# Patient Record
Sex: Male | Born: 1970 | Race: White | Hispanic: No | Marital: Married | Smoking: Former smoker
Health system: Southern US, Community
[De-identification: ages and names within clinical notes are randomized; demographics above are authoritative.]

## PROBLEM LIST (undated history)

## (undated) DIAGNOSIS — F419 Anxiety disorder, unspecified: Secondary | ICD-10-CM

## (undated) DIAGNOSIS — I10 Essential (primary) hypertension: Secondary | ICD-10-CM

## (undated) DIAGNOSIS — J40 Bronchitis, not specified as acute or chronic: Secondary | ICD-10-CM

## (undated) DIAGNOSIS — C801 Malignant (primary) neoplasm, unspecified: Secondary | ICD-10-CM

## (undated) DIAGNOSIS — K649 Unspecified hemorrhoids: Secondary | ICD-10-CM

## (undated) DIAGNOSIS — R361 Hematospermia: Secondary | ICD-10-CM

## (undated) DIAGNOSIS — T7840XA Allergy, unspecified, initial encounter: Secondary | ICD-10-CM

## (undated) DIAGNOSIS — G43909 Migraine, unspecified, not intractable, without status migrainosus: Secondary | ICD-10-CM

## (undated) DIAGNOSIS — J302 Other seasonal allergic rhinitis: Secondary | ICD-10-CM

## (undated) HISTORY — DX: Malignant (primary) neoplasm, unspecified: C80.1

## (undated) HISTORY — DX: Anxiety disorder, unspecified: F41.9

## (undated) HISTORY — DX: Other seasonal allergic rhinitis: J30.2

## (undated) HISTORY — DX: Unspecified hemorrhoids: K64.9

## (undated) HISTORY — PX: OTHER SURGICAL HISTORY: SHX169

## (undated) HISTORY — DX: Allergy, unspecified, initial encounter: T78.40XA

## (undated) HISTORY — DX: Bronchitis, not specified as acute or chronic: J40

## (undated) HISTORY — DX: Migraine, unspecified, not intractable, without status migrainosus: G43.909

## (undated) HISTORY — DX: Hematospermia: R36.1

---

## 2011-02-09 DIAGNOSIS — C801 Malignant (primary) neoplasm, unspecified: Secondary | ICD-10-CM

## 2011-02-09 HISTORY — DX: Malignant (primary) neoplasm, unspecified: C80.1

## 2014-07-26 LAB — LIPID PANEL
CHOLESTEROL: 186 (ref 0–200)
HDL: 48 (ref 35–70)
LDL CALC: 111
TRIGLYCERIDES: 135 (ref 40–160)

## 2014-07-26 LAB — BASIC METABOLIC PANEL
BUN: 10 (ref 4–21)
Creatinine: 0.8 (ref <=1.3)
Glucose: 97
Potassium: 3.9 (ref 3.4–5.3)
SODIUM: 142 (ref 137–147)

## 2014-07-26 LAB — HEPATIC FUNCTION PANEL
ALK PHOS: 78 (ref 25–125)
ALT: 35 (ref 10–40)
AST: 19 (ref 14–40)
Bilirubin, Total: 0.6

## 2014-10-30 LAB — HM HIV SCREENING LAB: HM HIV Screening: NEGATIVE

## 2014-10-30 LAB — PSA: PSA: 0.64

## 2015-02-18 LAB — CBC AND DIFFERENTIAL
HCT: 42 (ref 41–53)
Hemoglobin: 14.6 (ref 13.5–17.5)
Platelets: 300 (ref 150–399)
WBC: 10.3

## 2015-02-20 ENCOUNTER — Encounter: Payer: Self-pay | Admitting: *Deleted

## 2015-02-21 ENCOUNTER — Ambulatory Visit (INDEPENDENT_AMBULATORY_CARE_PROVIDER_SITE_OTHER): Payer: 59 | Admitting: Urology

## 2015-02-21 ENCOUNTER — Encounter: Payer: Self-pay | Admitting: Urology

## 2015-02-21 VITALS — BP 156/100 | HR 70 | Ht 71.0 in | Wt 178.9 lb

## 2015-02-21 DIAGNOSIS — R361 Hematospermia: Secondary | ICD-10-CM | POA: Diagnosis not present

## 2015-02-21 DIAGNOSIS — R1030 Lower abdominal pain, unspecified: Secondary | ICD-10-CM | POA: Diagnosis not present

## 2015-02-21 DIAGNOSIS — R1031 Right lower quadrant pain: Secondary | ICD-10-CM

## 2015-02-21 DIAGNOSIS — N451 Epididymitis: Secondary | ICD-10-CM | POA: Diagnosis not present

## 2015-02-21 LAB — URINALYSIS, COMPLETE
Bilirubin, UA: NEGATIVE
Glucose, UA: NEGATIVE
Ketones, UA: NEGATIVE
Leukocytes, UA: NEGATIVE
NITRITE UA: NEGATIVE
PH UA: 7 (ref 5.0–7.5)
Protein, UA: NEGATIVE
Specific Gravity, UA: 1.01 (ref 1.005–1.030)
Urobilinogen, Ur: 0.2 mg/dL (ref 0.2–1.0)

## 2015-02-21 LAB — MICROSCOPIC EXAMINATION
EPITHELIAL CELLS (NON RENAL): NONE SEEN /HPF (ref 0–10)
RBC MICROSCOPIC, UA: NONE SEEN /HPF (ref 0–?)

## 2015-02-21 MED ORDER — DOXYCYCLINE HYCLATE 100 MG PO CAPS: 100.0000 mg | ORAL_CAPSULE | Freq: Two times a day (BID) | ORAL | Status: DC

## 2015-02-21 NOTE — Progress Notes (Signed)
02/21/2015 3:02 PM   Paul Schroeder 09-14-1970 [id number]  Referring provider: No referring provider defined for this encounter.  Chief Complaint  Patient presents with  . Hematospermia    referred by Dr. Vertis Kelch walk in clinic  . Abdominal Pain    HPI: Patient is a 45 year old Caucasian male who is referred to [place] by Dr. Harrington Challenger for hematospermia and pain in the abdominal area.    Patient states that the hematospermia started 3 months ago.  He was also experiencing dysuria at that time. He stated he had 2 rounds of doxycycline, each time for 10 days, the hematospermia would resolve but then would return.  He states he has been told that he had blood in the urine, but the laboratory data provided by his primary care's office noted blood on urine dipstick, no micro analysis of the urine was performed.  He does not complain of any gross hematuria.  His UA today is unremarkable.  Then one week ago, he developed right-sided groin pain that radiates into his scrotum and down his inner thigh. The pain lasts for 5-10 seconds and then abates. The pain is very severe. The pain is intermittent throughout the day. He also has been experiencing frequent urination with the onset of this pain.  He does not have a prior history of kidney stone disease. He states his brother suffers with stones.  He has been having associated fevers, chills, nausea, vomiting and a 40 pound weight loss over the last 3 months. He also complains of nighttime diaphoresis.   PMH: Past Medical History  Diagnosis Date  . Seasonal allergies   . Hematospermia     Surgical History: Past Surgical History  Procedure Laterality Date  . Skin lesions removed      Home Medications:    Medication List       This list is accurate as of: 02/21/15 11:59 PM.  Always use your most recent med list.               cetirizine 1 MG/ML syrup  Commonly known as:  ZYRTEC  Take 10 mg by mouth daily.     doxycycline 100  MG capsule  Commonly known as:  VIBRAMYCIN  Take 1 capsule (100 mg total) by mouth every 12 (twelve) hours.     HYDROcodone-acetaminophen 5-325 MG tablet  Commonly known as:  NORCO/VICODIN     mometasone 50 MCG/ACT nasal spray  Commonly known as:  NASONEX  Place 2 sprays into the nose daily.     montelukast 10 MG tablet  Commonly known as:  SINGULAIR  Take 10 mg by mouth at bedtime.     SUMAtriptan 100 MG tablet  Commonly known as:  IMITREX        Allergies: No Known Allergies  Family History: Family History  Problem Relation Age of Onset  . Kidney disease Brother   . Prostate cancer Neg Hx     Social History:  reports that he has been smoking.  He does not have any smokeless tobacco history on file. He reports that he drinks alcohol. He reports that he uses illicit drugs about twice per week.  ROS: UROLOGY Frequent Urination?: Yes Hard to postpone urination?: No Burning/pain with urination?: Yes Get up at night to urinate?: No Leakage of urine?: No Urine stream starts and stops?: No Trouble starting stream?: No Do you have to strain to urinate?: No Blood in urine?: Yes Urinary tract infection?: No Sexually transmitted disease?: No  Injury to kidneys or bladder?: No Painful intercourse?: No Weak stream?: No Erection problems?: No Penile pain?: Yes  Gastrointestinal Nausea?: Yes Vomiting?: Yes Indigestion/heartburn?: No Diarrhea?: Yes Constipation?: No  Constitutional Fever: Yes Night sweats?: Yes Weight loss?: Yes Fatigue?: Yes  Skin Skin rash/lesions?: No Itching?: No  Eyes Blurred vision?: Yes Double vision?: No  Ears/Nose/Throat Sore throat?: Yes Sinus problems?: Yes  Hematologic/Lymphatic Swollen glands?: No Easy bruising?: No  Cardiovascular Leg swelling?: No Chest pain?: No  Respiratory Cough?: No Shortness of breath?: No  Endocrine Excessive thirst?: No  Musculoskeletal Back pain?: No Joint pain?:  Yes  Neurological Headaches?: Yes Dizziness?: Yes  Psychologic Depression?: No Anxiety?: No  Physical Exam: BP 156/100 mmHg  Pulse 70  Ht 5\' 11"  (1.803 m)  Wt 178 lb 14.4 oz (81.149 kg)  BMI 24.96 kg/m2  Constitutional: Well nourished. Alert and oriented, No acute distress. HEENT:  AT, moist mucus membranes. Trachea midline, no masses. Cardiovascular: No clubbing, cyanosis, or edema. Respiratory: Normal respiratory effort, no increased work of breathing. GI: Abdomen is soft, non tender, non distended, no abdominal masses. Liver and spleen not palpable.  No hernias appreciated.  Stool sample for occult testing is not indicated.   GU: No CVA tenderness.  No bladder fullness or masses.  Patient with circumcised phallus.   Urethral meatus is patent.  No penile discharge. No penile lesions or rashes. Scrotum without lesions, cysts, rashes and/or edema.  Testicles are located scrotally bilaterally. No masses are appreciated in the testicles. Left epididymis are normal.  Right epididymis is tender.  Rectal: Patient with  normal sphincter tone. Anus and perineum without scarring or rashes. No rectal masses are appreciated. Prostate is approximately 45 grams, no nodules are appreciated. Seminal vesicles are normal. Skin: No rashes, bruises or suspicious lesions. Lymph: No cervical or inguinal adenopathy. Neurologic: Grossly intact, no focal deficits, moving all 4 extremities. Psychiatric: Normal mood and affect.  Laboratory Data:  Urinalysis Results for orders placed or performed in visit on 02/21/15  Microscopic Examination  Result Value Ref Range   WBC, UA 0-5 0 -  5 /hpf   RBC, UA None seen 0 -  2 /hpf   Epithelial Cells (non renal) None seen 0 - 10 /hpf   Mucus, UA Present (A) Not Estab.   Bacteria, UA Few (A) None seen/Few  Urinalysis, Complete  Result Value Ref Range   Specific Gravity, UA 1.010 1.005 - 1.030   pH, UA 7.0 5.0 - 7.5   Color, UA Yellow Yellow   Appearance  Ur Clear Clear   Leukocytes, UA Negative Negative   Protein, UA Negative Negative/Trace   Glucose, UA Negative Negative   Ketones, UA Negative Negative   RBC, UA Trace (A) Negative   Bilirubin, UA Negative Negative   Urobilinogen, Ur 0.2 0.2 - 1.0 mg/dL   Nitrite, UA Negative Negative   Microscopic Examination See below:      Assessment & Plan:    1. Hematospermia:   I reassured the patient that hematospermia is usually a benign finding.  He seemed satisfied with my explanation.  2. Right epididymitis:   Right epididymis was tender on exam. I prescribed doxycycline 100 mg twice a day for 30 days. We will reexamine the patient when he returns to the office for his CT scan report.  3. RIght groin pain:   Patient's symptomatology may represent an undiagnosed right ureteral stone. He will be scheduled for a CT renal stone protocol and will return to discuss the  findings.  - Urinalysis, Complete   Return for CT Renal Stone Study report.  These notes generated with voice recognition software. I apologize for typographical errors.  Zara Council, [facility] [address], [place], [place] [address] [phone]

## 2015-02-22 ENCOUNTER — Telehealth: Payer: Self-pay | Admitting: Urology

## 2015-02-22 DIAGNOSIS — N451 Epididymitis: Secondary | ICD-10-CM | POA: Insufficient documentation

## 2015-02-22 DIAGNOSIS — R361 Hematospermia: Secondary | ICD-10-CM | POA: Insufficient documentation

## 2015-02-22 NOTE — Telephone Encounter (Signed)
Would you send a copy of the office visit to Dr. Harrington Challenger at [facility] physicians urgent care?

## 2015-02-25 ENCOUNTER — Telehealth: Payer: Self-pay | Admitting: Urology

## 2015-02-25 NOTE — Telephone Encounter (Signed)
Would you send a copy of the office visit to Dr. Harrington Challenger at [facility] physicians urgent care?

## 2015-02-25 NOTE — Telephone Encounter (Signed)
Done ° ° °Paul Schroeder °

## 2015-02-27 ENCOUNTER — Ambulatory Visit
Admission: RE | Admit: 2015-02-27 | Discharge: 2015-02-27 | Disposition: A | Discharge status: Home / Self Care | Payer: Managed Care, Other (non HMO) | Source: Ambulatory Visit | Attending: Urology | Admitting: Urology

## 2015-02-27 DIAGNOSIS — R1031 Right lower quadrant pain: Secondary | ICD-10-CM

## 2015-02-27 DIAGNOSIS — R3129 Other microscopic hematuria: Secondary | ICD-10-CM | POA: Insufficient documentation

## 2015-02-27 DIAGNOSIS — Z85820 Personal history of malignant melanoma of skin: Secondary | ICD-10-CM | POA: Insufficient documentation

## 2015-02-27 DIAGNOSIS — R300 Dysuria: Secondary | ICD-10-CM | POA: Diagnosis not present

## 2015-02-27 DIAGNOSIS — R1030 Lower abdominal pain, unspecified: Secondary | ICD-10-CM | POA: Insufficient documentation

## 2015-03-03 ENCOUNTER — Ambulatory Visit (INDEPENDENT_AMBULATORY_CARE_PROVIDER_SITE_OTHER): Payer: 59 | Admitting: Urology

## 2015-03-03 ENCOUNTER — Encounter: Payer: Self-pay | Admitting: Urology

## 2015-03-03 VITALS — BP 152/97 | HR 86 | Ht 71.0 in | Wt 180.2 lb

## 2015-03-03 DIAGNOSIS — R1030 Lower abdominal pain, unspecified: Secondary | ICD-10-CM | POA: Diagnosis not present

## 2015-03-03 DIAGNOSIS — R361 Hematospermia: Secondary | ICD-10-CM

## 2015-03-03 DIAGNOSIS — R1031 Right lower quadrant pain: Secondary | ICD-10-CM

## 2015-03-03 DIAGNOSIS — N451 Epididymitis: Secondary | ICD-10-CM | POA: Diagnosis not present

## 2015-03-03 LAB — URINALYSIS, COMPLETE
Bilirubin, UA: NEGATIVE
GLUCOSE, UA: NEGATIVE
Ketones, UA: NEGATIVE
Leukocytes, UA: NEGATIVE
NITRITE UA: NEGATIVE
PH UA: 7 (ref 5.0–7.5)
Protein, UA: NEGATIVE
RBC, UA: NEGATIVE
Specific Gravity, UA: 1.015 (ref 1.005–1.030)
UUROB: 0.2 mg/dL (ref 0.2–1.0)

## 2015-03-03 LAB — MICROSCOPIC EXAMINATION
BACTERIA UA: NONE SEEN
EPITHELIAL CELLS (NON RENAL): NONE SEEN /HPF (ref 0–10)
RBC MICROSCOPIC, UA: NONE SEEN /HPF (ref 0–?)
WBC UA: NONE SEEN /HPF (ref 0–?)

## 2015-03-03 LAB — BLADDER SCAN AMB NON-IMAGING: SCAN RESULT: 23

## 2015-03-03 NOTE — Progress Notes (Signed)
3:59 PM   Paul Schroeder 25-Mar-1970 [id number]  Referring provider: No referring provider defined for this encounter.  Chief Complaint  Patient presents with  . Results    CT    HPI: Patient is a 45 year old Caucasian male who is referred to [place] by Dr. Harrington Challenger for hematospermia and pain in the abdominal area who presents today for his CT Renal Stone report.  Previous history Patient states that the hematospermia started 3 months ago.  He was also experiencing dysuria at that time. He stated he had 2 rounds of doxycycline, each time for 10 days, the hematospermia would resolve but then would return.  He states he has been told that he had blood in the urine, but the laboratory data provided by his primary care's office noted blood on urine dipstick, no micro analysis of the urine was performed.  He does not complain of any gross hematuria.  Then he developed right-sided groin pain that radiates into his scrotum and down his inner thigh. The pain lasts for 5-10 seconds and then abates. The pain is very severe. The pain is intermittent throughout the day. He also has been experiencing frequent urination with the onset of this pain.  He does not have a prior history of kidney stone disease. He states his brother suffers with stones.  He has been having associated fevers, chills, nausea, vomiting and a 40 pound weight loss over the last 3 months. He also complains of nighttime diaphoresis.  Today, he reports that he is still having pain in his right groin.  He has not had relief from the antibiotics.  He is not having fevers, chills, nausea or vomiting.  He is experiencing night sweats and fatigue.  He is also complaining of dysuria, but no gross hematuria.  His UA is unremarkable on today's exam.    The CT Renal Stone Study completed on 02/27/2015 noted no etiology for his pain.  No evidence of urolithiasis, hydronephrosis, or other acute findings. I have reviewed the films with the patient.      PMH: Past Medical History  Diagnosis Date  . Seasonal allergies   . Hematospermia     Surgical History: Past Surgical History  Procedure Laterality Date  . Skin lesions removed      Home Medications:    Medication List       This list is accurate as of: 03/03/15 11:59 PM.  Always use your most recent med list.               cetirizine 1 MG/ML syrup  Commonly known as:  ZYRTEC  Take 10 mg by mouth daily. Reported on 03/03/2015     doxycycline 100 MG capsule  Commonly known as:  VIBRAMYCIN  Take 1 capsule (100 mg total) by mouth every 12 (twelve) hours.     fexofenadine 180 MG tablet  Commonly known as:  ALLEGRA  Take 180 mg by mouth daily.     HYDROcodone-acetaminophen 5-325 MG tablet  Commonly known as:  NORCO/VICODIN     mometasone 50 MCG/ACT nasal spray  Commonly known as:  NASONEX  Place 2 sprays into the nose daily.     montelukast 10 MG tablet  Commonly known as:  SINGULAIR  Take 10 mg by mouth at bedtime.     SUMAtriptan 100 MG tablet  Commonly known as:  IMITREX        Allergies: No Known Allergies  Family History: Family History  Problem Relation Age of Onset  .  Kidney disease Brother   . Prostate cancer Neg Hx     Social History:  reports that he has been smoking.  He does not have any smokeless tobacco history on file. He reports that he drinks alcohol. He reports that he uses illicit drugs about twice per week.  ROS: UROLOGY Frequent Urination?: No Hard to postpone urination?: No Burning/pain with urination?: Yes Get up at night to urinate?: No Leakage of urine?: No Urine stream starts and stops?: No Trouble starting stream?: No Do you have to strain to urinate?: No Blood in urine?: No Urinary tract infection?: No Sexually transmitted disease?: No Injury to kidneys or bladder?: No Painful intercourse?: No Weak stream?: No Erection problems?: No Penile pain?: No  Gastrointestinal Nausea?: No Vomiting?:  No Indigestion/heartburn?: No Diarrhea?: No Constipation?: No  Constitutional Fever: No Night sweats?: Yes Weight loss?: No Fatigue?: Yes  Skin Skin rash/lesions?: No Itching?: No  Eyes Blurred vision?: No Double vision?: No  Ears/Nose/Throat Sore throat?: No Sinus problems?: No  Hematologic/Lymphatic Swollen glands?: No Easy bruising?: No  Cardiovascular Leg swelling?: No Chest pain?: No  Respiratory Cough?: Yes Shortness of breath?: No  Endocrine Excessive thirst?: No  Musculoskeletal Back pain?: No Joint pain?: No  Neurological Headaches?: Yes Dizziness?: No  Psychologic Depression?: No Anxiety?: No  Physical Exam: BP 152/97 mmHg  Pulse 86  Ht 5\' 11"  (1.803 m)  Wt 180 lb 3.2 oz (81.738 kg)  BMI 25.14 kg/m2  Constitutional: Well nourished. Alert and oriented, No acute distress. HEENT: [place] AT, moist mucus membranes. Trachea midline, no masses. Cardiovascular: No clubbing, cyanosis, or edema. Respiratory: Normal respiratory effort, no increased work of breathing. GI: Abdomen is soft, non tender, non distended, no abdominal masses. Liver and spleen not palpable.  No hernias appreciated.  Stool sample for occult testing is not indicated.   GU: No CVA tenderness.  No bladder fullness or masses.  Patient with circumcised phallus.   Urethral meatus is patent.  No penile discharge. No penile lesions or rashes. Scrotum without lesions, cysts, rashes and/or edema.  Testicles are located scrotally bilaterally. No masses are appreciated in the testicles. Left epididymis are normal.  Right epididymis is tender.  Rectal: Patient with  normal sphincter tone. Anus and perineum without scarring or rashes. No rectal masses are appreciated. Prostate is approximately 45 grams, no nodules are appreciated. Seminal vesicles are normal. Skin: No rashes, bruises or suspicious lesions. Lymph: No cervical or inguinal adenopathy. Neurologic: Grossly intact, no focal deficits,  moving all 4 extremities. Psychiatric: Normal mood and affect.  Laboratory Data:  Urinalysis Results for orders placed or performed in visit on 03/03/15  Microscopic Examination  Result Value Ref Range   WBC, UA None seen 0 -  5 /hpf   RBC, UA None seen 0 -  2 /hpf   Epithelial Cells (non renal) None seen 0 - 10 /hpf   Bacteria, UA None seen None seen/Few  Urinalysis, Complete  Result Value Ref Range   Specific Gravity, UA 1.015 1.005 - 1.030   pH, UA 7.0 5.0 - 7.5   Color, UA Yellow Yellow   Appearance Ur Clear Clear   Leukocytes, UA Negative Negative   Protein, UA Negative Negative/Trace   Glucose, UA Negative Negative   Ketones, UA Negative Negative   RBC, UA Negative Negative   Bilirubin, UA Negative Negative   Urobilinogen, Ur 0.2 0.2 - 1.0 mg/dL   Nitrite, UA Negative Negative   Microscopic Examination See below:   BLADDER SCAN AMB  NON-IMAGING  Result Value Ref Range   Scan Result 23     Pertinent imaging CLINICAL DATA: Right lower quadrant pain radiating to right groin for 2 weeks. Hematocrit hernia. Microscopic hematuria and dysuria. Personal history of melanoma.  EXAM: CT ABDOMEN AND PELVIS WITHOUT CONTRAST  TECHNIQUE: Multidetector CT imaging of the abdomen and pelvis was performed following the standard protocol without IV contrast.  COMPARISON: None.  FINDINGS: Lower chest: No acute findings.  Hepatobiliary: No mass visualized on this un-enhanced exam. Gallbladder is unremarkable.  Pancreas: No mass or inflammatory process identified on this un-enhanced exam.  Spleen: Within normal limits in size.  Adrenals/Urinary Tract: No evidence of urolithiasis or hydronephrosis. No definite mass visualized on this un-enhanced exam.  Stomach/Bowel: No evidence of obstruction, inflammatory process, or abnormal fluid collections.  Vascular/Lymphatic: No pathologically enlarged lymph nodes. No evidence of abdominal aortic  aneurysm.  Reproductive: No mass or other significant abnormality.  Other: No evidence of inguinal hernia or mass.  Musculoskeletal: No suspicious bone lesions identified.  IMPRESSION: No evidence of urolithiasis, hydronephrosis, or other acute findings.   Electronically Signed  By: Earle Gell M.D.  On: 02/27/2015 08:54  Assessment & Plan:    1. Hematospermia:   I reassured the patient that hematospermia is usually a benign finding.  He seemed satisfied with my explanation.  2. Right epididymitis:   Right epididymis is still tender on exam.  I will obtain a scrotal ultrasound to rule out any testicular pathology.    3. RIght groin pain:   Patient's CT scan did not demonstrate a stone.  I will be obtaining a scrotal ultrasound to rule out any testicular pathology.    - Urinalysis, Complete   Return for scrotal ultrasound report.  These notes generated with voice recognition software. I apologize for typographical errors.  Zara Council, [facility] [address], [place], [place] [address] [phone]

## 2015-03-04 ENCOUNTER — Ambulatory Visit
Admission: RE | Admit: 2015-03-04 | Discharge: 2015-03-04 | Disposition: A | Discharge status: Home / Self Care | Payer: Managed Care, Other (non HMO) | Source: Ambulatory Visit | Attending: Urology | Admitting: Urology

## 2015-03-04 DIAGNOSIS — N5082 Scrotal pain: Secondary | ICD-10-CM | POA: Diagnosis not present

## 2015-03-04 DIAGNOSIS — R1030 Lower abdominal pain, unspecified: Secondary | ICD-10-CM | POA: Diagnosis not present

## 2015-03-04 DIAGNOSIS — R1031 Right lower quadrant pain: Secondary | ICD-10-CM

## 2015-03-05 DIAGNOSIS — R1031 Right lower quadrant pain: Secondary | ICD-10-CM | POA: Insufficient documentation

## 2015-03-06 ENCOUNTER — Encounter: Payer: Self-pay | Admitting: Urology

## 2015-03-06 ENCOUNTER — Ambulatory Visit (INDEPENDENT_AMBULATORY_CARE_PROVIDER_SITE_OTHER): Payer: 59 | Admitting: Urology

## 2015-03-06 ENCOUNTER — Ambulatory Visit: Payer: 59 | Admitting: Urology

## 2015-03-06 ENCOUNTER — Encounter: Payer: Self-pay | Admitting: *Deleted

## 2015-03-06 VITALS — BP 173/98 | HR 83 | Ht 71.0 in | Wt 180.0 lb

## 2015-03-06 DIAGNOSIS — R1031 Right lower quadrant pain: Secondary | ICD-10-CM

## 2015-03-06 DIAGNOSIS — R1030 Lower abdominal pain, unspecified: Secondary | ICD-10-CM | POA: Diagnosis not present

## 2015-03-06 NOTE — Progress Notes (Signed)
Verbal order from [facility] to refer patient to Dr. Jamal Collin at [facility] surgical  (Downstairs) for possible hernia. Called Dr. Jamal Collin office and scheduled patient his appointment is February 1st at 2:30. Patient given appointment info and instructions for appointment. (med list ,co pay, etc).

## 2015-03-06 NOTE — Progress Notes (Signed)
11:18 PM   Paul Schroeder [birth year] [id number]  Referring provider: No referring provider defined for this encounter.  Chief Complaint  Patient presents with  . Results    scrotal US report    HPI: Patient is a 45 year old Caucasian male who is referred to [place] by Dr. Harrington Challenger for hematospermia and pain in the lower right abdominal area who presents today for his scrotal ultrasound results.    Previous history Patient states that the hematospermia started 3 months ago.  He was also experiencing dysuria at that time. He stated he had 2 rounds of doxycycline, each time for 10 days, the hematospermia would resolve but then would return.  He states he has been told that he had blood in the urine, but the laboratory data provided by his primary care's office noted blood on urine dipstick, no micro analysis of the urine was performed.  He does not complain of any gross hematuria.  Then he developed right-sided groin pain that radiates into his scrotum and down his inner thigh. The pain lasts for 5-10 seconds and then abates. The pain is very severe. The pain is intermittent throughout the day. He also has been experiencing frequent urination with the onset of this pain.  He does not have a prior history of kidney stone disease. He states his brother suffers with stones.  He has been having associated fevers, chills, nausea, vomiting and a 40 pound weight loss over the last 3 months. He also complains of nighttime diaphoresis.  The CT Renal Stone Study completed on 02/27/2015 noted no etiology for his pain.  No evidence of urolithiasis, hydronephrosis, or other acute findings. I have reviewed the films with the patient.     Today, he reports that he is still having pain in his right groin.  He is having relief with on scheduled ibuprofen dosing.   He is not having fevers, chills, nausea or vomiting.  He is experiencing night sweats and fatigue.  The scrotal ultrasound performed on 03/04/2015 did not  demonstrate any etiology for his right sided inguinal abdominal pain.  I reviewed the films with the patient.   PMH: Past Medical History  Diagnosis Date  . Seasonal allergies   . Hematospermia     Surgical History: Past Surgical History  Procedure Laterality Date  . Skin lesions removed      Home Medications:    Medication List       This list is accurate as of: 03/06/15 11:59 PM.  Always use your most recent med list.               cetirizine 1 MG/ML syrup  Commonly known as:  ZYRTEC  Take 10 mg by mouth daily. Reported on 03/06/2015     doxycycline 100 MG capsule  Commonly known as:  VIBRAMYCIN  Take 1 capsule (100 mg total) by mouth every 12 (twelve) hours.     fexofenadine 180 MG tablet  Commonly known as:  ALLEGRA  Take 180 mg by mouth daily.     HYDROcodone-acetaminophen 5-325 MG tablet  Commonly known as:  NORCO/VICODIN     mometasone 50 MCG/ACT nasal spray  Commonly known as:  NASONEX  Place 2 sprays into the nose daily.     montelukast 10 MG tablet  Commonly known as:  SINGULAIR  Take 10 mg by mouth at bedtime.     SUMAtriptan 100 MG tablet  Commonly known as:  IMITREX        Allergies:  No Known Allergies  Family History: Family History  Problem Relation Age of Onset  . Kidney disease Brother   . Prostate cancer Neg Hx     Social History:  reports that he has been smoking.  He does not have any smokeless tobacco history on file. He reports that he drinks alcohol. He reports that he uses illicit drugs about twice per week.  ROS: UROLOGY Frequent Urination?: No Hard to postpone urination?: No Burning/pain with urination?: Yes Get up at night to urinate?: No Leakage of urine?: No Urine stream starts and stops?: No Trouble starting stream?: No Do you have to strain to urinate?: No Blood in urine?: No Urinary tract infection?: No Sexually transmitted disease?: No Injury to kidneys or bladder?: No Painful intercourse?: No Weak  stream?: No Erection problems?: No Penile pain?: No  Gastrointestinal Nausea?: No Vomiting?: No Indigestion/heartburn?: No Diarrhea?: No Constipation?: No  Constitutional Fever: No Night sweats?: Yes Weight loss?: No Fatigue?: Yes  Skin Skin rash/lesions?: No Itching?: No  Eyes Blurred vision?: No Double vision?: No  Ears/Nose/Throat Sore throat?: No Sinus problems?: No  Hematologic/Lymphatic Swollen glands?: No Easy bruising?: No  Cardiovascular Leg swelling?: No Chest pain?: No  Respiratory Cough?: Yes Shortness of breath?: No  Endocrine Excessive thirst?: No  Musculoskeletal Back pain?: No Joint pain?: No  Neurological Headaches?: Yes Dizziness?: No  Psychologic Depression?: No Anxiety?: No  Physical Exam: BP 173/98 mmHg  Pulse 83  Ht 5\' 11"  (1.803 m)  Wt 180 lb (81.647 kg)  BMI 25.12 kg/m2  Constitutional: Well nourished. Alert and oriented, No acute distress. HEENT: [place] AT, moist mucus membranes. Trachea midline, no masses. Cardiovascular: No clubbing, cyanosis, or edema. Respiratory: Normal respiratory effort, no increased work of breathing. GI: Abdomen is soft, non tender, non distended, no abdominal masses. Liver and spleen not palpable.  No hernias appreciated.  Stool sample for occult testing is not indicated.   GU: No CVA tenderness.  No bladder fullness or masses.  Patient with circumcised phallus.   Urethral meatus is patent.  No penile discharge. No penile lesions or rashes. Scrotum without lesions, cysts, rashes and/or edema.  Testicles are located scrotally bilaterally. No masses are appreciated in the testicles. Right and left epididymis are normal.  Patient experienced extreme tenderness when palpating on his right inguinal area. No obvious hernia was palpated. Rectal: Patient with  normal sphincter tone. Anus and perineum without scarring or rashes. No rectal masses are appreciated. Prostate is approximately 45 grams, no nodules  are appreciated. Seminal vesicles are normal. Skin: No rashes, bruises or suspicious lesions. Lymph: No cervical or inguinal adenopathy. Neurologic: Grossly intact, no focal deficits, moving all 4 extremities. Psychiatric: Normal mood and affect.  Laboratory Data:  Urinalysis Results for orders placed or performed in visit on 03/03/15  Microscopic Examination  Result Value Ref Range   WBC, UA None seen 0 -  5 /hpf   RBC, UA None seen 0 -  2 /hpf   Epithelial Cells (non renal) None seen 0 - 10 /hpf   Bacteria, UA None seen None seen/Few  Urinalysis, Complete  Result Value Ref Range   Specific Gravity, UA 1.015 1.005 - 1.030   pH, UA 7.0 5.0 - 7.5   Color, UA Yellow Yellow   Appearance Ur Clear Clear   Leukocytes, UA Negative Negative   Protein, UA Negative Negative/Trace   Glucose, UA Negative Negative   Ketones, UA Negative Negative   RBC, UA Negative Negative   Bilirubin, UA Negative  Negative   Urobilinogen, Ur 0.2 0.2 - 1.0 mg/dL   Nitrite, UA Negative Negative   Microscopic Examination See below:   BLADDER SCAN AMB NON-IMAGING  Result Value Ref Range   Scan Result 23     Pertinent imaging CLINICAL DATA: Right lower quadrant pain radiating to right groin for 2 weeks. Hematocrit hernia. Microscopic hematuria and dysuria. Personal history of melanoma.  EXAM: CT ABDOMEN AND PELVIS WITHOUT CONTRAST  TECHNIQUE: Multidetector CT imaging of the abdomen and pelvis was performed following the standard protocol without IV contrast.  COMPARISON: None.  FINDINGS: Lower chest: No acute findings.  Hepatobiliary: No mass visualized on this un-enhanced exam. Gallbladder is unremarkable.  Pancreas: No mass or inflammatory process identified on this un-enhanced exam.  Spleen: Within normal limits in size.  Adrenals/Urinary Tract: No evidence of urolithiasis or hydronephrosis. No definite mass visualized on this un-enhanced exam.  Stomach/Bowel: No evidence  of obstruction, inflammatory process, or abnormal fluid collections.  Vascular/Lymphatic: No pathologically enlarged lymph nodes. No evidence of abdominal aortic aneurysm.  Reproductive: No mass or other significant abnormality.  Other: No evidence of inguinal hernia or mass.  Musculoskeletal: No suspicious bone lesions identified.  IMPRESSION: No evidence of urolithiasis, hydronephrosis, or other acute findings.   Electronically Signed  By: Earle Gell M.D.  On: 02/27/2015 08:54  CLINICAL DATA: Right scrotal pain x1 month.  EXAM: SCROTAL ULTRASOUND  DOPPLER ULTRASOUND OF THE TESTICLES  TECHNIQUE: Complete ultrasound examination of the testicles, epididymis, and other scrotal structures was performed. Color and spectral Doppler ultrasound were also utilized to evaluate blood flow to the testicles.  COMPARISON: CT 02/26/2015.  FINDINGS: Right testicle  Measurements: 4.9 x 2.2 x 3.6 cm. No mass or microlithiasis visualized.  Left testicle  Measurements: 4.9 x 2.3 x 2.8 cm. No mass or microlithiasis visualized.  Right epididymis: Normal in size and appearance.  Left epididymis: Normal in size and appearance.  Hydrocele: None visualized.  Varicocele: None visualized.  Pulsed Doppler interrogation of both testes demonstrates normal low resistance arterial and venous waveforms bilaterally.  IMPRESSION: Negative exam.   Electronically Signed  By: Marcello Moores Register  On: 03/04/2015 13:55   Assessment & Plan:    1. Hematospermia:   I reassured the patient that hematospermia is usually a benign finding.  He seemed satisfied with my explanation.  2. Right epididymitis:   Right epididymis is no longer tender on exam.      3. RIght groin pain:   Patient's CT scan did not demonstrate a stone.  Scrotal ultrasound did not demonstrate any etiology for the pain.   I'll refer to general surgery for a possible hernia.          No Follow-up on file.  These notes generated with voice recognition software. I apologize for typographical errors.  Zara Council, [facility] [address] [place], [place] [address] [phone]

## 2015-03-12 ENCOUNTER — Encounter: Payer: Self-pay | Admitting: General Surgery

## 2015-03-12 ENCOUNTER — Ambulatory Visit (INDEPENDENT_AMBULATORY_CARE_PROVIDER_SITE_OTHER): Payer: Managed Care, Other (non HMO) | Admitting: General Surgery

## 2015-03-12 VITALS — BP 130/76 | HR 80 | Resp 14 | Ht 71.0 in | Wt 180.0 lb

## 2015-03-12 DIAGNOSIS — R1031 Right lower quadrant pain: Secondary | ICD-10-CM

## 2015-03-12 NOTE — Patient Instructions (Addendum)
Obtain CT and follow up.  Patient has been scheduled for a CT abdomen/pelvis with contrast at [facility] for 03-18-15 at 8 am (arrive 7:45 am). Prep: NPO 4 hours prior and pick up prep kit. Patient verbalizes understanding.

## 2015-03-12 NOTE — Progress Notes (Signed)
Patient ID: Paul Schroeder, male   DOB: [birth year], 45 y.o.   MRN: [id number]  Chief Complaint  Patient presents with  . Abdominal Pain    HPI Paul Schroeder is a 45 y.o. male here today for an evaluation of right scrotal pain. Patient states he noticed this area about one month ago. He states there is some intermittent pain with activity. Patient had an ultrasound done on 03/04/15. Has been taking 676m ibuprofen 3x a day. HPI I have reviewed the history of present illness with the patient.  Past Medical History  Diagnosis Date  . Seasonal allergies   . Hematospermia   . Hemorrhoids     Past Surgical History  Procedure Laterality Date  . Skin lesions removed      Family History  Problem Relation Age of Onset  . Kidney disease Brother   . Prostate cancer Neg Hx     Social History Social History  Substance Use Topics  . Smoking status: Current Every Day Smoker -- 1.00 packs/day for 1 years    Types: Cigarettes  . Smokeless tobacco: None  . Alcohol Use: 0.0 oz/week    0 Standard drinks or equivalent per week     Comment: rare    No Known Allergies  Current Outpatient Prescriptions  Medication Sig Dispense Refill  . BIOTIN 5000 PO Take by mouth.    . cetirizine (ZYRTEC) 1 MG/ML syrup Take 10 mg by mouth daily. Reported on 03/06/2015    . Coenzyme Q10 (CO Q 10 PO) Take by mouth.    . doxycycline (VIBRAMYCIN) 100 MG capsule Take 1 capsule (100 mg total) by mouth every 12 (twelve) hours. 60 capsule 0  . fexofenadine (ALLEGRA) 180 MG tablet Take 180 mg by mouth daily.    .Marland KitchenFINASTERIDE PO Take by mouth.    .Marland KitchenHYDROcodone-acetaminophen (NORCO/VICODIN) 5-325 MG tablet     . LYSINE HCL PO Take by mouth.    . mometasone (NASONEX) 50 MCG/ACT nasal spray Place 2 sprays into the nose daily.    . montelukast (SINGULAIR) 10 MG tablet Take 10 mg by mouth at bedtime.    . Multiple Vitamin (ONE-A-DAY ESSENTIAL PO) Take by mouth.    . Probiotic Product (FORTIFY DAILY PROBIOTIC  PO) Take by mouth.    . SUMAtriptan (IMITREX) 100 MG tablet      No current facility-administered medications for this visit.    Review of Systems Review of Systems  Constitutional: Negative.   Respiratory: Negative.   Cardiovascular: Negative.   Gastrointestinal: Positive for nausea and vomiting. Negative for diarrhea.    Blood pressure 130/76, pulse 80, resp. rate 14, height 5' 11"  (1.803 m), weight 180 lb (81.647 kg).  Physical Exam Physical Exam  Constitutional: He is oriented to person, place, and time. He appears well-developed and well-nourished.  Eyes: Conjunctivae are normal. No scleral icterus.  Neck: Neck supple.  Cardiovascular: Normal rate, regular rhythm and normal heart sounds.   Pulmonary/Chest: Effort normal and breath sounds normal.  Abdominal: Soft. Normal appearance and bowel sounds are normal. There is tenderness in the right lower quadrant. Hernia confirmed negative in the right inguinal area and confirmed negative in the left inguinal area.    Lymphadenopathy:    He has no cervical adenopathy.       Right: No inguinal adenopathy present.       Left: No inguinal adenopathy present.  Neurological: He is alert and oriented to person, place, and time.  Skin: Skin is warm  and dry.    Data Reviewed Ultrasound and CT reviewed. CT was done without contrast- no apparent hernia noted  Assessment    Right groin/rlq pain. No apparent findings to explain this pain.    Plan    Plan to obtain contrasted CT of abdomen   Patient has been scheduled for a CT abdomen/pelvis with contrast at [facility] for 03-18-15 at 8 am (arrive 7:45 am). Prep: NPO 4 hours prior and pick up prep kit. Patient verbalizes understanding.      This information has been scribed by Gaspar Cola CMA.  PCP:  Provider   Christene Lye 03/13/2015, 3:12 PM

## 2015-03-13 ENCOUNTER — Ambulatory Visit: Payer: 59 | Admitting: Urology

## 2015-03-13 ENCOUNTER — Encounter: Payer: Self-pay | Admitting: General Surgery

## 2015-03-18 ENCOUNTER — Ambulatory Visit
Admission: RE | Admit: 2015-03-18 | Discharge: 2015-03-18 | Disposition: A | Discharge status: Home / Self Care | Payer: Managed Care, Other (non HMO) | Source: Ambulatory Visit | Attending: General Surgery | Admitting: General Surgery

## 2015-03-18 DIAGNOSIS — R1031 Right lower quadrant pain: Secondary | ICD-10-CM | POA: Insufficient documentation

## 2015-03-18 MED ORDER — IOHEXOL 350 MG/ML SOLN
100.0000 mL | Freq: Once | INTRAVENOUS | Status: AC | PRN
  Administered 2015-03-18: 100 mL via INTRAVENOUS

## 2015-03-19 NOTE — Progress Notes (Signed)
Appointment scheduled for 03-20-15 at 3:30 pm.

## 2015-03-20 ENCOUNTER — Ambulatory Visit (INDEPENDENT_AMBULATORY_CARE_PROVIDER_SITE_OTHER): Payer: Managed Care, Other (non HMO) | Admitting: General Surgery

## 2015-03-20 VITALS — BP 132/80 | HR 74 | Resp 12 | Ht 71.0 in | Wt 175.0 lb

## 2015-03-20 DIAGNOSIS — R1031 Right lower quadrant pain: Secondary | ICD-10-CM

## 2015-03-20 NOTE — Progress Notes (Signed)
This is a 45 year old male here today to discuss CT scan done on 03/18/15. He states the pain in right groin area has been better last several days I have reviewed the history of present illness with the patient.  CT showed no evidence of bowel obstruction or acute bowel inflammation, a normal appendix, and no evidence of hernia. This was reviewed fully with patient  Exam- no hernia palpated in the right or left inguinal region.  Impression- no hernia or RLQ findings tyo account for his groin pain Advised he finish doxycycline Rx and f/u with urology. Patient to return as needed

## 2015-03-20 NOTE — Patient Instructions (Signed)
Patient aware to call back with any questions or concerns. Follow-up as needed.

## 2015-03-21 ENCOUNTER — Encounter: Payer: Self-pay | Admitting: General Surgery

## 2015-03-31 ENCOUNTER — Telehealth: Payer: Self-pay | Admitting: Urology

## 2015-03-31 NOTE — Telephone Encounter (Signed)
Patient went to see a Psychologist, sport and exercise.  Surgeon does not feel that patient has a hernia.  Surgeon feels that it may be possible infection.  Patient reporting return of sporadic pain (3/10 - 10/10).  He finished his abx last Friday.  This is his 3rd round of Di oxycycline.  Patient requesting advice.

## 2015-03-31 NOTE — Telephone Encounter (Signed)
I will need to speak with one of the physicians concerning his next step.

## 2015-04-01 NOTE — Telephone Encounter (Signed)
LMOM

## 2015-04-04 NOTE — Telephone Encounter (Signed)
LMOM

## 2015-04-08 NOTE — Telephone Encounter (Signed)
No.  I have not yet.  Dr. Erlene Quan will be in the office tomorrow.

## 2015-04-08 NOTE — Telephone Encounter (Signed)
LMOM Did you get a chance to speak with a MD?

## 2015-04-11 ENCOUNTER — Encounter: Payer: Self-pay | Admitting: Urology

## 2015-04-15 ENCOUNTER — Telehealth: Payer: Self-pay

## 2015-04-15 NOTE — Telephone Encounter (Signed)
Spoke with pt in reference to scrotal pain. Made aware Larene Beach was able to speak with a provider and Dr. Alyson Ingles will be in office 04/22/15. Pt stated that he will be here. At this time pt requested to have more pain medication due to pain becoming worse. Please advise.

## 2015-04-16 MED ORDER — NAPROXEN 375 MG PO TABS: 375.0000 mg | ORAL_TABLET | Freq: Two times a day (BID) | ORAL | Status: DC

## 2015-04-16 NOTE — Telephone Encounter (Signed)
Please notify patient that I sent in a prescription for naproxen to his pharmacy to take twice daily.  He should not take Motrin/Ibuprofen while taking this medicine.  He can take additional Tylenol as needed.

## 2015-04-16 NOTE — Telephone Encounter (Signed)
Spoke with pt in reference to naproxen is sent to pharmacy. Made aware not to take motrin/ibuprofen while on medication but can have tylenol. Pt voiced understanding.

## 2015-04-22 ENCOUNTER — Ambulatory Visit (INDEPENDENT_AMBULATORY_CARE_PROVIDER_SITE_OTHER): Payer: Managed Care, Other (non HMO) | Admitting: Urology

## 2015-04-22 VITALS — BP 190/103 | HR 73 | Ht 71.0 in | Wt 179.0 lb

## 2015-04-22 DIAGNOSIS — M6289 Other specified disorders of muscle: Secondary | ICD-10-CM | POA: Insufficient documentation

## 2015-04-22 DIAGNOSIS — M6258 Muscle wasting and atrophy, not elsewhere classified, other site: Secondary | ICD-10-CM | POA: Diagnosis not present

## 2015-04-22 MED ORDER — DIAZEPAM 5 MG PO TABS: 5.0000 mg | ORAL_TABLET | Freq: Two times a day (BID) | ORAL | Status: DC | PRN

## 2015-04-22 MED ORDER — DIAZEPAM 5 MG PO TABS
5.0000 mg | ORAL_TABLET | Freq: Two times a day (BID) | ORAL | Status: DC | PRN
Start: 2015-04-22 — End: 2015-04-22

## 2015-04-22 NOTE — Addendum Note (Signed)
Addended by: Tommy Rainwater on: 04/22/2015 01:17 PM   Modules accepted: Orders

## 2015-04-22 NOTE — Progress Notes (Signed)
04/22/2015 10:06 AM   Paul Schroeder Apr 11, 1970 [id number]  Referring provider: Lona Kettle, MD [place], [place] [address]  Chief Complaint  Patient presents with  . Testicle Pain    HPI: [name] is a 45yo seen today for right inguinal/testicular pain. He has sharp, intermittent, moderate to severe right inguinal pain that radiates to the right testicle. He states if he eats salty food the pain gets worse.  He has dysuria, frequency, and urgency. No hx of hernia repair. He has tried ibuprofen for the pain which helps relieve the pain.  He is currently on miralax PRN for constipation.  He has hard daily bowel movements.   PMH: Past Medical History  Diagnosis Date  . Seasonal allergies   . Hematospermia   . Hemorrhoids     Surgical History: Past Surgical History  Procedure Laterality Date  . Skin lesions removed      Home Medications:    Medication List       This list is accurate as of: 04/22/15 10:06 AM.  Always use your most recent med list.               BIOTIN 5000 PO  Take by mouth.     cetirizine 1 MG/ML syrup  Commonly known as:  ZYRTEC  Take 10 mg by mouth daily. Reported on 03/06/2015     CO Q 10 PO  Take by mouth.     fexofenadine 180 MG tablet  Commonly known as:  ALLEGRA  Take 180 mg by mouth daily.     FINASTERIDE PO  Take by mouth.     FORTIFY DAILY PROBIOTIC PO  Take by mouth.     LYSINE HCL PO  Take by mouth.     mometasone 50 MCG/ACT nasal spray  Commonly known as:  NASONEX  Place 2 sprays into the nose daily.     montelukast 10 MG tablet  Commonly known as:  SINGULAIR  Take 10 mg by mouth at bedtime.     naproxen 375 MG tablet  Commonly known as:  NAPROSYN  Take 1 tablet (375 mg total) by mouth 2 (two) times daily with a meal.     ONE-A-DAY ESSENTIAL PO  Take by mouth.     SUMAtriptan 100 MG tablet  Commonly known as:  IMITREX        Allergies: No Known Allergies  Family  History: Family History  Problem Relation Age of Onset  . Kidney disease Brother   . Prostate cancer Neg Hx     Social History:  reports that he has been smoking Cigarettes.  He has a 1 pack-year smoking history. He does not have any smokeless tobacco history on file. He reports that he drinks alcohol. He reports that he uses illicit drugs about twice per week.  ROS: UROLOGY Frequent Urination?: Yes Hard to postpone urination?: No Burning/pain with urination?: Yes Get up at night to urinate?: No Leakage of urine?: No Urine stream starts and stops?: No Trouble starting stream?: No Do you have to strain to urinate?: No Blood in urine?: No Urinary tract infection?: No Sexually transmitted disease?: No Injury to kidneys or bladder?: No Painful intercourse?: No Weak stream?: No Erection problems?: No Penile pain?: No  Gastrointestinal Nausea?: No Vomiting?: No Indigestion/heartburn?: No Diarrhea?: Yes Constipation?: Yes  Constitutional Fever: No Night sweats?: Yes Weight loss?: No Fatigue?: Yes  Skin Skin rash/lesions?: No Itching?: No  Eyes Blurred vision?: No Double vision?: No  Ears/Nose/Throat Sore throat?: No Sinus problems?: No  Hematologic/Lymphatic Swollen glands?: No Easy bruising?: No  Cardiovascular Leg swelling?: No Chest pain?: No  Respiratory Cough?: No Shortness of breath?: No  Endocrine Excessive thirst?: No  Musculoskeletal Back pain?: No Joint pain?: No  Neurological Headaches?: Yes Dizziness?: No  Psychologic Depression?: No Anxiety?: No  Physical Exam: BP 190/103 mmHg  Pulse 73  Ht 5\' 11"  (1.803 m)  Wt 81.194 kg (179 lb)  BMI 24.98 kg/m2  Constitutional:  Alert and oriented, No acute distress. HEENT: [place] AT, moist mucus membranes.  Trachea midline, no masses. Cardiovascular: No clubbing, cyanosis, or edema. Respiratory: Normal respiratory effort, no increased work of breathing. GI: Abdomen is soft, nontender,  nondistended, no abdominal masses GU: No CVA tenderness. No masses/lesion on penis/testis. Right inguinal tenderness to palpation Skin: No rashes, bruises or suspicious lesions. Lymph: No cervical or inguinal adenopathy. Neurologic: Grossly intact, no focal deficits, moving all 4 extremities. Psychiatric: Normal mood and affect.  Laboratory Data: No results found for: WBC, HGB, HCT, MCV, PLT  No results found for: CREATININE  No results found for: PSA  No results found for: TESTOSTERONE  No results found for: HGBA1C  Urinalysis    Component Value Date/Time   GLUCOSEU Negative 03/03/2015 1028   BILIRUBINUR Negative 03/03/2015 1028   NITRITE Negative 03/03/2015 1028   LEUKOCYTESUR Negative 03/03/2015 1028    Pertinent Imaging:   Assessment & Plan:   1. Pelvic floor dysfunction -valium 5mg  qhs -Pelvic floor PT  There are no diagnoses linked to this encounter.  No Follow-up on file.  Cleon Gustin, [facility] [address] [place], [place] [address] [phone]

## 2015-05-20 ENCOUNTER — Ambulatory Visit: Payer: Managed Care, Other (non HMO)

## 2015-05-26 ENCOUNTER — Encounter: Payer: Self-pay | Admitting: Physical Therapy

## 2015-05-26 ENCOUNTER — Ambulatory Visit: Payer: Managed Care, Other (non HMO) | Attending: Urology | Admitting: Physical Therapy

## 2015-05-26 DIAGNOSIS — R293 Abnormal posture: Secondary | ICD-10-CM | POA: Insufficient documentation

## 2015-05-26 DIAGNOSIS — R279 Unspecified lack of coordination: Secondary | ICD-10-CM | POA: Insufficient documentation

## 2015-05-26 DIAGNOSIS — R2689 Other abnormalities of gait and mobility: Secondary | ICD-10-CM | POA: Diagnosis present

## 2015-05-26 NOTE — Patient Instructions (Signed)
You are now ready to begin training the deep core muscles system: diaphragm, transverse abdominis, pelvic floor . These muscles must work together as a team.           The key to these exercises to train the brain to coordinate the timing of these muscles and to have them turn on for long periods of time to hold you upright against gravity (especially important if you are on your feet all day).These muscles are postural muscles and play a role stabilizing your spine and bodyweight. By doing these repetitions slowly and correctly instead of doing crunches, you will achieve a flatter belly without a lower pooch. You are also placing your spine in a more neutral position and breathing properly which in turn, decreases your risk for problems related to your pelvic floor, abdominal, and low back such as pelvic organ prolapse, hernias, diastasis recti (separation of superficial muscles), disk herniations, spinal fractures. These exercises set a solid foundation for you to later progress to resistance/ strength training with therabands and weights and return to other typical fitness exercises with a stronger deeper core.    Handout on decreasing downward forces on pelvic floor.

## 2015-05-27 ENCOUNTER — Ambulatory Visit: Payer: Managed Care, Other (non HMO) | Admitting: Physical Therapy

## 2015-05-27 DIAGNOSIS — R293 Abnormal posture: Secondary | ICD-10-CM

## 2015-05-27 DIAGNOSIS — R279 Unspecified lack of coordination: Secondary | ICD-10-CM | POA: Diagnosis not present

## 2015-05-27 DIAGNOSIS — R2689 Other abnormalities of gait and mobility: Secondary | ICD-10-CM

## 2015-05-27 NOTE — Therapy (Signed)
[place] MAIN [facility] SERVICES [address] [place], [place], [address] Phone: [phone]   Fax:  [phone]  Physical Therapy Treatment  Patient Details  Name: Paul Schroeder MRN: [id number] Date of Birth: 1970-10-14 Referring Provider: Noah Delaine  Encounter Date: 05/27/2015      PT End of Session - 05/27/15 1741    Visit Number 2   Number of Visits 12   Date for PT Re-Evaluation 08/18/15   PT Start Time 1340   PT Stop Time [id number]   PT Time Calculation (min) 65 min   Activity Tolerance Patient tolerated treatment well;No increased pain   Behavior During Therapy [facility] for tasks assessed/performed      Past Medical History  Diagnosis Date  . Seasonal allergies   . Hematospermia   . Hemorrhoids   . Anxiety   . Cancer [facility]) 2013    Malignant mole on his back removed   . Allergy   . Migraines     family Hx, triggered by stress  . Bronchitis     Past Surgical History  Procedure Laterality Date  . Skin lesions removed      There were no vitals filed for this visit.      Subjective Assessment - 05/27/15 1739    Subjective Pt reported he only had to be reminded of taking deeper breaths twice today by his apply watch app because he  has gained more awareness.               [facility] PT Assessment - 05/27/15 1614    PROM   Overall PROM Comments HIP IR 0 deg bilaterally    Palpation   Spinal mobility Referred pain from T10-12 with PAVM along SP. hypomobile thoracic segments. Increased paraspinal mm tensions along midback     Palpation comment tenderness and tightness along obturator rami (obturator internus B )                    Pelvic Floor Special Questions - 05/27/15 1317    Diastasis Recti neg           [facility] Adult PT Treatment/Exercise - 05/27/15 1737    Neuro Re-ed    Neuro Re-ed Details  see pt instructions ( sitting posture, modify car seat with towels)  edcuation of spinal curves   Exercises   Exercises --  see pt instructions   Manual Therapy   Other Manual Therapy Grade II-III PA along SP of T5, T10-12, Grade II along TP on R and CVJ B along thorax.  STM  release along B obt int                 PT Education - 05/27/15 1319    Education provided Yes   Education Details POC, anatomy, physiology, goals, nervious system and the role of stress, exercise principles for decreasing strain on pelvic floor mm    Person(s) Educated Patient   Methods Explanation;Demonstration;Tactile cues;Verbal cues;Handout   Comprehension Returned demonstration;Verbalized understanding             PT Long Term Goals - 05/27/15 1359    PT LONG TERM GOAL #1   Title Pt ill decrease his score on NIH-CPSI 56% to < 46% in order to restore pelvic floor function and improve QOL.   Time 12   Period Weeks   Status New   PT LONG TERM GOAL #2   Title Pt will demo proper sitting posture across 2 visits to  improve spinal alignment and optimal activation of deep core mm when sitting at his desk.   Time 12   Period Weeks   Status New   PT LONG TERM GOAL #3   Title Pt will demo no tenderness to palpation to R medical aspect of iliac crest and can demo no lumbopelvic instability with 5 reps of deep core level 1-4 in order to progress to fitness activities with minimize risks for injuries.    Time 12   Period Weeks   Status New   PT LONG TERM GOAL #4   Title Pt will report no R groin pain with driving nor urination for 1 week in order to return to ADLs.    Time 12   Period Weeks   Status New               Plan - 05/27/15 1742    Clinical Impression Statement Pt responded to manual Tx without complaints and showed increased spinal/ hip mobility, more upright posture, and decreased mm tensions along his midback and pelvic floor. Pt will continue to benefit from continued skilled PT.    Rehab Potential Good   PT Frequency 2x / week   PT Duration 12 weeks   PT Treatment/Interventions  ADLs/Self Care Home Management;Aquatic Therapy;Electrical Stimulation;Cryotherapy;Biofeedback;Gait training;Traction;Moist Heat;Functional mobility training;Therapeutic activities;Stair training;Therapeutic exercise;Balance training;Neuromuscular re-education;Manual techniques;Patient/family education;Scar mobilization;Energy conservation;Taping   Consulted and Agree with Plan of Care Patient      Patient will benefit from skilled therapeutic intervention in order to improve the following deficits and impairments:  Improper body mechanics, Pain, Postural dysfunction, Decreased mobility, Decreased coordination, Decreased activity tolerance, Decreased endurance, Decreased strength, Impaired flexibility, Decreased range of motion, Decreased safety awareness  Visit Diagnosis: Unspecified lack of coordination  Other abnormalities of gait and mobility  Abnormal posture     Problem List Patient Active Problem List   Diagnosis Date Noted  . Pelvic floor dysfunction 04/22/2015  . Right groin pain 03/05/2015  . Hematospermia 02/22/2015  . Epididymitis, right 02/22/2015    Jerl Mina ,PT, DPT, E-RYT  05/27/2015, 5:44 PM  [place] MAIN [facility] SERVICES [address] [place], [place], [address] Phone: [phone]   Fax:  [phone]  Name: Riyansh Westmeyer MRN: [id number] Date of Birth: [birth year]

## 2015-05-27 NOTE — Therapy (Signed)
[place] MAIN [facility] SERVICES [address] [place], [place], [address] Phone: [phone]   Fax:  [phone]  Physical Therapy Evaluation  Patient Details  Name: Paul Schroeder MRN: [id number] Date of Birth: November 22, 1970 Referring Provider: Noah Delaine  Encounter Date: 05/26/2015      PT End of Session - 05/27/15 1320    Visit Number 1   Number of Visits 12   Date for PT Re-Evaluation 08/18/15   PT Start Time 0805   PT Stop Time 0915   PT Time Calculation (min) 70 min   Activity Tolerance Patient tolerated treatment well;No increased pain   Behavior During Therapy [facility] for tasks assessed/performed      Past Medical History  Diagnosis Date  . Seasonal allergies   . Hematospermia   . Hemorrhoids   . Anxiety   . Cancer [facility]) 2013    Malignant mole on his back removed   . Allergy   . Migraines     family Hx, triggered by stress  . Bronchitis     Past Surgical History  Procedure Laterality Date  . Skin lesions removed      There were no vitals filed for this visit.       Subjective Assessment - 05/26/15 0814    Subjective Pt reported R groin pain that radiates to the pelvic area. 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 There were times when pt had to help pt of the floor which pt wonders if lifting him may have triggered the R groin pain along with increased stress about the situation. Currently, pt expereinces less stress because friend situation has improved and he no longer has to play caretaker and also his medication signs and symptoms have also improved with medications prsecribed by his urologist.  Initially the pain was sharp, 10/10, experienced 4x/ day and now only 1x /day.  He experienced nausea and vomitting 1x/ day daily, and now 2x in the past week.  Initially, pt had blood in semen and  UTI and these Sx have improved since new  medications. Semen amount has increased and bowels have also improved.  Currently, pain is 3/10 localized in R groin with an escalation of pain 10/10 once a day in the car when driving, in the movie theatre, and with intake of salty foods. Pt 's pain also is triggered when contracting pelvic floor with stopping the flow of urination. Pt has been cleared of hernias. Pt had a prior sit-up routine 20x, sit-up 20 x for 3x/ week.      Pertinent History Six months ago, experienced weight -loss of 65 lbs with human growth hormone and decreased carbs, a program initiated by his PCP. Pt has decreased his medications and  his headaches haved improved.              [facility] PT Assessment - 05/27/15 1312    Assessment   Medical Diagnosis pelvic pain   Referring Provider Noah Delaine   Precautions   Precautions None   Restrictions   Weight Bearing Restrictions No   Balance Screen   Has the patient fallen in the past 6 months No   Observation/Other Assessments   Observations ankles under chair, slumped sitting, anxious demeanor  tearful during session   Other Surveys  --  NIH-CPSI 56%    Coordination   Gross Motor Movements are Fluid and Coordinated --  chest breathing, no pelvic floor ROM   Fine Motor Movements are Fluid and Coordinated --  abdominal/pelvic floor straining   Posture/Postural Control   Posture Comments lumbopelvic instability with hip flexion   Palpation   Palpation comment flinching tenderness along medial edge of R iliac crest in supine w/ L hip flexion (pre-Tx) , less tenderness w/ L hip flexion (post-Tx)   Bed Mobility   Bed Mobility --  crunch method (p!), no pain with log roolling cue                 Pelvic Floor Special Questions - 05/27/15 1317    Diastasis Recti neg          [facility] Adult PT Treatment/Exercise - 05/27/15 1312    Therapeutic Activites    Therapeutic Activities --  proper bodymechanics with toileting, bed mobility    Neuro Re-ed    Neuro  Re-ed Details  see pt instructions  education o POC, HEP, goals, anatomy/physiology                 PT Education - 05/27/15 1319    Education provided Yes   Education Details POC, anatomy, physiology, goals, nervious system and the role of stress, exercise principles for decreasing strain on pelvic floor mm    Person(s) Educated Patient   Methods Explanation;Demonstration;Tactile cues;Verbal cues;Handout   Comprehension Returned demonstration;Verbalized understanding            PT Long Term Goals - 05/27/15 1359    PT LONG TERM GOAL #1   Title Pt ill decrease his score on NIH-CPSI 56% to < 46% in order to restore pelvic floor function and improve QOL.   Time 12   Period Weeks   Status New   PT LONG TERM GOAL #2   Title Pt will demo proper sitting posture across 2 visits to improve spinal alignment and optimal activation of deep core mm when sitting at his desk.   Time 12   Period Weeks   Status New   PT LONG TERM GOAL #3   Title Pt will demo no tenderness to palpation to R medical aspect of iliac crest and can demo no lumbopelvic instability with 5 reps of deep core level 1-4 in order to progress to fitness activities with minimize risks for injuries.    Time 12   Period Weeks   Status New   PT LONG TERM GOAL #4   Title Pt will report no R groin pain with driving nor urination for 1 week in order to return to ADLs.    Time 12   Period Weeks   Status New              Plan - 05/27/15 1321    Clinical Impression Statement Pt is a 45 yo male whose complaints include R groin pain that radiates to his pelvic area and occurs with driving and urination. Through PT exam, pt's pain was reproduced with palpation to medial  aspect of R iliac crest and with  L hip flexion in a supine position. Pain was decreased after treatment. Pt's deficits that contribute to his pain include poor posture, weak deep core mm, dyscoordination of diaphragm , pelvic floor, and abdominal wall.  Pt had poor knowledge of fitness exercises and body mechanics with functional activities and demo'd movement patterns which placed strain on abdominopelvic areas. Pt also showed poor stress-management skills and had psychosocial contributors to his Sx. Pt appeared less anxious and more relaxed post-Tx.  Plan to assess spinal segmental mobility and ilioinginual and iliohypograstric nn innervations from lower thoracic segments given pt's increased thoracic kyphotic posture.      Rehab Potential Good   PT Frequency 2x / week   PT Duration 12 weeks   PT Treatment/Interventions ADLs/Self Care Home Management;Aquatic Therapy;Electrical Stimulation;Cryotherapy;Biofeedback;Gait training;Traction;Moist Heat;Functional mobility training;Therapeutic activities;Stair training;Therapeutic exercise;Balance training;Neuromuscular re-education;Manual techniques;Patient/family education;Scar mobilization;Energy conservation;Taping   Consulted and Agree with Plan of Care Patient      Patient will benefit from skilled therapeutic intervention in order to improve the following deficits and impairments:  Improper body mechanics, Pain, Postural dysfunction, Decreased mobility, Decreased coordination, Decreased activity tolerance, Decreased endurance, Decreased strength, Impaired flexibility, Decreased range of motion, Decreased safety awareness  Visit Diagnosis: Unspecified lack of coordination  Other abnormalities of gait and mobility  Abnormal posture     Problem List Patient Active Problem List   Diagnosis Date Noted  . Pelvic floor dysfunction 04/22/2015  . Right groin pain 03/05/2015  . Hematospermia 02/22/2015  . Epididymitis, right 02/22/2015    Paul Schroeder ,PT, DPT, E-RYT  05/27/2015, 1:36 PM  [place] MAIN [facility] SERVICES [address] [place], [place], [address] Phone: [phone]   Fax:  [phone]  Name: Paul Schroeder MRN:  [id number] Date of Birth: 02-16-1970

## 2015-05-27 NOTE — Patient Instructions (Addendum)
Sitting posture (feet under knees, pelvis neutral)   Take a profile pic of your work station  [employer] in car seat with vertical towel folded into thirds to support spine (decrease slumped posture)  And fill half folded towel into bucket seat to bring hips level with knees as much as possible.    ____________________   Diaphragmatic breathing on belly (pillow under hips) , forehead on hands   15 in a.m, 15 in p.m.   Frog stretch on belly:  inhale, then exhale heels apart  (pillow under hips )  (TO INCREASE HIP FLEXIBILITY)  15 in am, 15 in pm    Open book (handout) in sidelying with pillow between knees and bhind back  15, 15 L/R   DEEP CORE LEVEL 2 (knee out)  15 in am. 15 am.     ___________________________  At work: stretches:  Open book by the wall    10x, 10 x L /R    Seated figure -4  Cross over     5 breaths each side

## 2015-06-02 ENCOUNTER — Encounter: Payer: Managed Care, Other (non HMO) | Admitting: Physical Therapy

## 2015-06-04 LAB — CBC AND DIFFERENTIAL
HCT: 43 (ref 41–53)
HEMOGLOBIN: 14.7 (ref 13.5–17.5)
PLATELETS: 285 (ref 150–399)
WBC: 9.4

## 2015-06-06 ENCOUNTER — Ambulatory Visit (INDEPENDENT_AMBULATORY_CARE_PROVIDER_SITE_OTHER): Payer: Managed Care, Other (non HMO) | Admitting: Urology

## 2015-06-06 VITALS — BP 149/88 | HR 88 | Temp 98.1°F | Resp 16 | Wt 174.0 lb

## 2015-06-06 DIAGNOSIS — M6258 Muscle wasting and atrophy, not elsewhere classified, other site: Secondary | ICD-10-CM

## 2015-06-06 DIAGNOSIS — M6289 Other specified disorders of muscle: Secondary | ICD-10-CM

## 2015-06-06 MED ORDER — BACLOFEN 10 MG PO TABS: 10.0000 mg | ORAL_TABLET | Freq: Two times a day (BID) | ORAL | Status: DC

## 2015-06-06 MED ORDER — DIAZEPAM 5 MG PO TABS: 5.0000 mg | ORAL_TABLET | Freq: Three times a day (TID) | ORAL | Status: DC | PRN

## 2015-06-06 MED ORDER — DIAZEPAM 5 MG PO TABS: 5.0000 mg | ORAL_TABLET | Freq: Two times a day (BID) | ORAL | Status: DC | PRN

## 2015-06-06 MED ORDER — BACLOFEN 10 MG PO TABS: 5.0000 mg | ORAL_TABLET | Freq: Two times a day (BID) | ORAL | Status: DC

## 2015-06-06 NOTE — Progress Notes (Signed)
06/06/2015 11:31 AM   Paul Schroeder 27-May-1970 [id number]  Referring provider: Lona Kettle, MD [place], [place] [address]  Chief Complaint  Patient presents with  . Follow-up    for pelvic floor dysfunction    HPI: Paul Schroeder is a 45yo here for followup for pelvic floor dysfunction. He was prescribed valium and schedule for pelvic floor PT. He has been to 2 PT appointments. The right inguinal pain worsens throughout the day.  The valium has helped his pelvic/inguinakl pain at night.  He has mild urgency, frequency and nocturia which has greatly improved since starting PT   PMH: Past Medical History  Diagnosis Date  . Seasonal allergies   . Hematospermia   . Hemorrhoids   . Anxiety   . Cancer [facility]) 2013    Malignant mole on his back removed   . Allergy   . Migraines     family Hx, triggered by stress  . Bronchitis     Surgical History: Past Surgical History  Procedure Laterality Date  . Skin lesions removed      Home Medications:    Medication List       This list is accurate as of: 06/06/15 11:31 AM.  Always use your most recent med list.               BIOTIN 5000 PO  Take by mouth.     cetirizine 1 MG/ML syrup  Commonly known as:  ZYRTEC  Take 10 mg by mouth daily. Reported on 03/06/2015     CO Q 10 PO  Take by mouth.     diazepam 5 MG tablet  Commonly known as:  VALIUM  Take 1 tablet (5 mg total) by mouth 3 times/day as needed-between meals & bedtime for anxiety or muscle spasms.     fexofenadine 180 MG tablet  Commonly known as:  ALLEGRA  Take 180 mg by mouth daily.     FINASTERIDE PO  Take by mouth.     FORTIFY DAILY PROBIOTIC PO  Take by mouth.     LYSINE HCL PO  Take by mouth.     mometasone 50 MCG/ACT nasal spray  Commonly known as:  NASONEX  Place 2 sprays into the nose daily.     montelukast 10 MG tablet  Commonly known as:  SINGULAIR  Take 10 mg by mouth at bedtime.     ONE-A-DAY ESSENTIAL PO  Take  by mouth.     SUMAtriptan 100 MG tablet  Commonly known as:  IMITREX        Allergies: No Known Allergies  Family History: Family History  Problem Relation Age of Onset  . Kidney disease Brother   . Prostate cancer Neg Hx     Social History:  reports that he has been smoking Cigarettes.  He has a 1 pack-year smoking history. He does not have any smokeless tobacco history on file. He reports that he drinks alcohol. He reports that he uses illicit drugs about twice per week.  ROS: UROLOGY Frequent Urination?: Yes Hard to postpone urination?: No Burning/pain with urination?: Yes Get up at night to urinate?: No Leakage of urine?: No Urine stream starts and stops?: No Trouble starting stream?: No Do you have to strain to urinate?: Yes Blood in urine?: No Urinary tract infection?: No Sexually transmitted disease?: No Injury to kidneys or bladder?: No Painful intercourse?: Yes Weak stream?: No Erection problems?: Yes Penile pain?: Yes  Gastrointestinal Nausea?: Yes Vomiting?: Yes  Indigestion/heartburn?: No Diarrhea?: Yes Constipation?: Yes  Constitutional Fever: No Night sweats?: Yes Weight loss?: No Fatigue?: Yes  Skin Skin rash/lesions?: No Itching?: No  Eyes Blurred vision?: Yes Double vision?: No  Ears/Nose/Throat Sore throat?: No Sinus problems?: No  Hematologic/Lymphatic Swollen glands?: No Easy bruising?: No  Cardiovascular Leg swelling?: No Chest pain?: No  Respiratory Cough?: No Shortness of breath?: No  Endocrine Excessive thirst?: Yes  Musculoskeletal Back pain?: No Joint pain?: No  Neurological Headaches?: Yes Dizziness?: Yes  Psychologic Depression?: Yes Anxiety?: Yes  Physical Exam: BP 149/88 mmHg  Pulse 88  Temp(Src) 98.1 F (36.7 C)  Resp 16  Wt 78.926 kg (174 lb)  Constitutional:  Alert and oriented, No acute distress. HEENT: [place] AT, moist mucus membranes.  Trachea midline, no masses. Cardiovascular: No  clubbing, cyanosis, or edema. Respiratory: Normal respiratory effort, no increased work of breathing. GI: Abdomen is soft, nontender, nondistended, no abdominal masses GU: No CVA tenderness. Skin: No rashes, bruises or suspicious lesions. Lymph: No cervical or inguinal adenopathy. Neurologic: Grossly intact, no focal deficits, moving all 4 extremities. Psychiatric: Normal mood and affect.  Laboratory Data: No results found for: WBC, HGB, HCT, MCV, PLT  No results found for: CREATININE  No results found for: PSA  No results found for: TESTOSTERONE  No results found for: HGBA1C  Urinalysis    Component Value Date/Time   APPEARANCEUR Clear 03/03/2015 1028   GLUCOSEU Negative 03/03/2015 1028   BILIRUBINUR Negative 03/03/2015 1028   PROTEINUR Negative 03/03/2015 1028   NITRITE Negative 03/03/2015 1028   LEUKOCYTESUR Negative 03/03/2015 1028    Pertinent Imaging: none  Assessment & Plan:    Pelvic floor dysfunction -Continue valium prn -continue pelvic floor PT -baclofen 5mg  BID for 1 week then 10mg  BID RTC 1 month  There are no diagnoses linked to this encounter.  No Follow-up on file.  Nicolette Bang, MD  [facility] [address] [place], [place] [address] [phone]

## 2015-06-09 ENCOUNTER — Ambulatory Visit: Payer: Managed Care, Other (non HMO) | Attending: Urology | Admitting: Physical Therapy

## 2015-06-09 DIAGNOSIS — R2689 Other abnormalities of gait and mobility: Secondary | ICD-10-CM | POA: Insufficient documentation

## 2015-06-09 DIAGNOSIS — R293 Abnormal posture: Secondary | ICD-10-CM

## 2015-06-09 DIAGNOSIS — R279 Unspecified lack of coordination: Secondary | ICD-10-CM

## 2015-06-09 NOTE — Patient Instructions (Addendum)
Strengthening Deep core level 2 (knee out)  10 x 3 reps  NEW: "w" with red band  10X 3 reps    Stretches: Open 15 reps each side  NEW: with a tie on [place] of foot R, L knee bent stabilizing with hips. Hold tie/strap  and roll foot across/ away  midline ~15-20 deg  10 reps each    NEW: at work: semi tandem (ski track) stretching hip flexors 5 breaths   Relaxing:  New: folded twoel into thirds lay with top edge of towel (placed horizontally) under armpits to open chest, shoulders blade glide back and a small rolled towel under neck (make sure small enough to keep chin neutral)  5 min-10 min

## 2015-06-09 NOTE — Therapy (Signed)
[place] MAIN [facility] SERVICES [address] [place], [place], [address] Phone: [phone]   Fax:  [phone]  Physical Therapy Treatment  Patient Details  Name: Paul Schroeder MRN: [id number] Date of Birth: [birth year] Referring Provider: Noah Delaine  Encounter Date: 06/09/2015      PT End of Session - 06/09/15 0959    Visit Number 3   Number of Visits 12   Date for PT Re-Evaluation 08/18/15   PT Start Time 0805   PT Stop Time 0900   PT Time Calculation (min) 55 min   Activity Tolerance Patient tolerated treatment well;No increased pain   Behavior During Therapy [facility] for tasks assessed/performed      Past Medical History  Diagnosis Date  . Seasonal allergies   . Hematospermia   . Hemorrhoids   . Anxiety   . Cancer [facility]) 2013    Malignant mole on his back removed   . Allergy   . Migraines     family Hx, triggered by stress  . Bronchitis     Past Surgical History  Procedure Laterality Date  . Skin lesions removed      There were no vitals filed for this visit.      Subjective Assessment - 06/09/15 0807    Subjective Pt reported no al ot of change. Pt has started a muscle relaxer and that has helped.  Pt reported after last session, pt felt relief from his pelvic pain for atleast two days.    Pertinent History Six months ago, experienced weight -loss of 65 lbs with human growth hormone and decreased carbs, a program initiated by his PCP. Pt has decreased his medications and  his headaches haved improved.              [facility] PT Assessment - 06/09/15 0955    Observation/Other Assessments   Observations R groin pain with L hip flexion end range    PROM   Overall PROM Comments hip IR ~10 deg B (pre Tx), hip ~15 deg (post-Tx)     Palpation   Spinal mobility no referred pain to R groin pain. hypomobile joints T5-12 , increased paraspinals at midback R > L (decreased postTx)    Palpation comment decreased  tightness.tenderness at L obturator rami                      [facility] Adult PT Treatment/Exercise - 06/09/15 0957    Exercises   Exercises --  see pt instructions   Moist Heat Therapy   Moist Heat Location --  back 5 min, skin intact post TX   Manual Therapy   Other Manual Therapy Grade III PA along SP of T5-12, TP along R, STM along R paraspinals at midback   STM along Morral joints and pects                PT Education - 06/09/15 0959    Education provided Yes   Education Details HEP   Person(s) Educated Patient   Methods Explanation;Demonstration;Tactile cues;Verbal cues;Handout   Comprehension Returned demonstration;Verbalized understanding             PT Long Term Goals - 06/09/15 1004    PT LONG TERM GOAL #1   Title Pt ill decrease his score on NIH-CPSI 56% to < 46% in order to restore pelvic floor function and improve QOL.   Time 12   Period Weeks   Status On-going   PT LONG TERM  GOAL #2   Title Pt will demo proper sitting posture across 2 visits to improve spinal alignment and optimal activation of deep core mm when sitting at his desk.   Time 12   Period Weeks   Status Partially Met   PT LONG TERM GOAL #3   Title Pt will demo no tenderness to palpation to R medical aspect of iliac crest and can demo no lumbopelvic instability with 5 reps of deep core level 1-4 in order to progress to fitness activities with minimize risks for injuries.    Time 12   Period Weeks   Status On-going   PT LONG TERM GOAL #4   Title Pt will report no R groin pain with driving nor urination for 1 week in order to return to ADLs.    Time 12   Period Weeks   Status On-going               Plan - 06/09/15 1000    Clinical Impression Statement Pt showed increased hip ROM, decreased midback/ pelvic floor mm tensions, and  improved thoracic segmental mobility. Pt continues to have R groin pain with L hip flexion in supine position and plan to assess pubic  symphysis area at next session. Initiated theraband back strengthening exercising today.   Pt required cuing to decrease effort with breathing minimize light-headedness. Pt had no complaints at the end of the session after corrections to cues. Pt will continue benefit from skilled PT.  Educated pt to initiate a regular aerobic activity like walking around the block for wellness.    Rehab Potential Good   PT Frequency 2x / week   PT Duration 12 weeks   PT Treatment/Interventions ADLs/Self Care Home Management;Aquatic Therapy;Electrical Stimulation;Cryotherapy;Biofeedback;Gait training;Traction;Moist Heat;Functional mobility training;Therapeutic activities;Stair training;Therapeutic exercise;Balance training;Neuromuscular re-education;Manual techniques;Patient/family education;Scar mobilization;Energy conservation;Taping   Consulted and Agree with Plan of Care Patient      Patient will benefit from skilled therapeutic intervention in order to improve the following deficits and impairments:  Improper body mechanics, Pain, Postural dysfunction, Decreased mobility, Decreased coordination, Decreased activity tolerance, Decreased endurance, Decreased strength, Impaired flexibility, Decreased range of motion, Decreased safety awareness  Visit Diagnosis: Unspecified lack of coordination  Other abnormalities of gait and mobility  Abnormal posture     Problem List Patient Active Problem List   Diagnosis Date Noted  . Pelvic floor dysfunction 04/22/2015  . Right groin pain 03/05/2015  . Hematospermia 02/22/2015  . Epididymitis, right 02/22/2015    Jerl Mina ,PT, DPT, E-RYT  06/09/2015, 10:05 AM  [place] MAIN [facility] SERVICES [address] [place], [place], [address] Phone: [phone]   Fax:  [phone]  Name: Paul Schroeder MRN: [id number] Date of Birth: 05/24/1970

## 2015-06-20 ENCOUNTER — Ambulatory Visit: Payer: Managed Care, Other (non HMO) | Admitting: Physical Therapy

## 2015-06-20 DIAGNOSIS — R279 Unspecified lack of coordination: Secondary | ICD-10-CM | POA: Diagnosis not present

## 2015-06-20 DIAGNOSIS — R293 Abnormal posture: Secondary | ICD-10-CM

## 2015-06-20 DIAGNOSIS — R2689 Other abnormalities of gait and mobility: Secondary | ICD-10-CM

## 2015-06-20 NOTE — Patient Instructions (Addendum)
Relaxation:  1 a)  midback on yoga blocks (vertically placed between shoulder blades, horizontal behind head), knees bent   10 min  1b)   End the above pose with laying flat and perform 3 cycles of body scan (audio emailed)  This can be your winddown routine after work  Work exercises/ stretches    Extended side angle  (longer length between feet on "ski tracks" and hip width apart.  L Front knee above ankle and not let it turn with pelvis as you move ribcage to R slightly). R palm "slicing" the air , parking upper arm by ear and with exhale, relax shoulder blade down away from ears)  5 breaths  Both sides L/ R    Heel raises (with equal weight bearing across balllmound to avoid twisting ankle)  10 x 3 x day    Strengthening: Bridge 5 reps (engaging through points of stability of arms, shoulders, head and feet)    Mini squats with knees behind toes (inhale expandpelvc floor, exhale, rise up) x 10    Continue with "W"

## 2015-06-20 NOTE — Therapy (Signed)
[place] MAIN [facility] SERVICES [address] [place], [place], [address] Phone: [phone]   Fax:  [phone]  Physical Therapy Treatment  Patient Details  Name: Paul Schroeder MRN: [id number] Date of Birth: [birth year] Referring Provider: Noah Delaine  Encounter Date: 06/20/2015      PT End of Session - 06/20/15 1005    Visit Number 4   Number of Visits 12   Date for PT Re-Evaluation 08/18/15   PT Start Time 0805   PT Stop Time 0910   PT Time Calculation (min) 65 min   Activity Tolerance Patient tolerated treatment well;No increased pain   Behavior During Therapy [facility] for tasks assessed/performed      Past Medical History  Diagnosis Date  . Seasonal allergies   . Hematospermia   . Hemorrhoids   . Anxiety   . Cancer [facility]) 2013    Malignant mole on his back removed   . Allergy   . Migraines     family Hx, triggered by stress  . Bronchitis     Past Surgical History  Procedure Laterality Date  . Skin lesions removed      There were no vitals filed for this visit.      Subjective Assessment - 06/20/15 0823    Subjective Pain is no longer continuous. When the pain comes, it is more severe and occurs mostly around lunch time and before bed time when medicine wears out. Pt continues to perform stretches at work. Pt reported he forgot to take his mm relaxer this morning. Pt 's MD plans to increase dosage next week.   Pertinent History Six months ago, experienced weight -loss of 65 lbs with human growth hormone and decreased carbs, a program initiated by his PCP. Pt has decreased his medications and  his headaches haved improved.              [facility] PT Assessment - 06/20/15 0854    Observation/Other Assessments   Observations rounded shoulders    Squat   Comments simulated workout routine. squat with anterior load on knee joints, demo'd proper alignment with demo and cues   Posture/Postural Control   Posture Comments  hyperextended knees in standing, posterior tilt of pelvis    Palpation   Palpation comment very minimal perineal mm tensions at pubic rami B                     [facility] Adult PT Treatment/Exercise - 06/20/15 0933    Therapeutic Activites    Therapeutic Activities --  see pt instructions   Neuro Re-ed    Neuro Re-ed Details  bridges with deep core activation (minimize pelvic floor/glut tightening) 5 reps, standing posture, refined alignment and progressed extended side angle                  PT Education - 06/20/15 0928    Education provided Yes   Education Details HEP, posture in standing and benefits of body scan    Person(s) Educated Patient   Methods Explanation;Demonstration;Tactile cues;Handout;Verbal cues   Comprehension Returned demonstration;Verbalized understanding             PT Long Term Goals - 06/09/15 1004    PT LONG TERM GOAL #1   Title Pt ill decrease his score on NIH-CPSI 56% to < 46% in order to restore pelvic floor function and improve QOL.   Time 12   Period Weeks   Status On-going   PT LONG  TERM GOAL #2   Title Pt will demo proper sitting posture across 2 visits to improve spinal alignment and optimal activation of deep core mm when sitting at his desk.   Time 12   Period Weeks   Status Partially Met   PT LONG TERM GOAL #3   Title Pt will demo no tenderness to palpation to R medical aspect of iliac crest and can demo no lumbopelvic instability with 5 reps of deep core level 1-4 in order to progress to fitness activities with minimize risks for injuries.    Time 12   Period Weeks   Status On-going   PT LONG TERM GOAL #4   Title Pt will report no R groin pain with driving nor urination for 1 week in order to return to ADLs.    Time 12   Period Weeks   Status On-going               Plan - 06/20/15 1005    Clinical Impression Statement Since his last session 2 weeks ago, pt reports that his pain is less continuous but  does return at greater instensity once his medications  wears off. Today, pt stated he forgot to take his mm relaxer mm and upon completing today's session, pt reported he felt " better than he has felt in a long time". Pt demo'd improved posture in standing with neuro-muscular re-education, refined technique with extended -side angle yoga posture, and decreased pelvic floor mm/ midback mm tensions today.  Pt progressed with strengthening exercises along with additional relaxation exercises. Pt was introduced the body scan technique and pain science education which her reported helped him to feel more relaxed.  Pt was educated to work with his MD to manage his pain medications.  Next visit: plan to reassess goals,  recheck R groin pain w/ hip flexion, and progress to deep core level 3, and continue to gradually help pt progress and modify his self-selected workout routine. Pt will continue to make improvements with a biopsychosocial approach to manage pain.  Pt continues to make progress with skilled PT. Frequency has been reduced to 1 x week.    Rehab Potential Good   PT Frequency 1x / week   PT Duration 12 weeks   PT Treatment/Interventions ADLs/Self Care Home Management;Aquatic Therapy;Electrical Stimulation;Cryotherapy;Biofeedback;Gait training;Traction;Moist Heat;Functional mobility training;Therapeutic activities;Stair training;Therapeutic exercise;Balance training;Neuromuscular re-education;Manual techniques;Patient/family education;Scar mobilization;Energy conservation;Taping   Consulted and Agree with Plan of Care Patient      Patient will benefit from skilled therapeutic intervention in order to improve the following deficits and impairments:  Improper body mechanics, Pain, Postural dysfunction, Decreased mobility, Decreased coordination, Decreased activity tolerance, Decreased endurance, Decreased strength, Impaired flexibility, Decreased range of motion, Decreased safety awareness  Visit  Diagnosis: Unspecified lack of coordination  Other abnormalities of gait and mobility  Abnormal posture     Problem List Patient Active Problem List   Diagnosis Date Noted  . Pelvic floor dysfunction 04/22/2015  . Right groin pain 03/05/2015  . Hematospermia 02/22/2015  . Epididymitis, right 02/22/2015    Jerl Mina ,PT, DPT, E-RYT  06/20/2015, 10:12 AM  [place] MAIN [facility] SERVICES [address] [place], [place], [address] Phone: [phone]   Fax:  [phone]  Name: Paul Schroeder MRN: [id number] Date of Birth: 1970/11/09

## 2015-06-23 ENCOUNTER — Ambulatory Visit: Payer: Managed Care, Other (non HMO) | Admitting: Physical Therapy

## 2015-07-02 ENCOUNTER — Encounter: Payer: Managed Care, Other (non HMO) | Admitting: Physical Therapy

## 2015-07-08 ENCOUNTER — Encounter: Payer: Self-pay | Admitting: Urology

## 2015-07-08 ENCOUNTER — Ambulatory Visit (INDEPENDENT_AMBULATORY_CARE_PROVIDER_SITE_OTHER): Payer: Managed Care, Other (non HMO) | Admitting: Urology

## 2015-07-08 VITALS — BP 153/101 | HR 98 | Ht 71.0 in | Wt 176.0 lb

## 2015-07-08 DIAGNOSIS — M6258 Muscle wasting and atrophy, not elsewhere classified, other site: Secondary | ICD-10-CM

## 2015-07-08 DIAGNOSIS — M6289 Other specified disorders of muscle: Secondary | ICD-10-CM

## 2015-07-08 NOTE — Progress Notes (Signed)
07/08/2015 11:34 AM   [name] 01-10-1971 [id number]  Referring provider: Lona Kettle, MD [place], [place] [address]  Chief Complaint  Patient presents with  . Pelvic Floor Dysfunction    4wk    HPI: Paul Schroeder is a 45yo here for followup for pelvic floor dysfunction. He is currently doing PT. He has been to 4 PT sessions. He is currently using valium and baclofen. He uses valium PRN QHS. He feels his pelvic pain has improved.  His LUTS have improved since PT. Dysuria has improved.    He has left lower extremity numbness that occurs in the morning when he wakes up then resolved 2-3 hours   PMH: Past Medical History  Diagnosis Date  . Seasonal allergies   . Hematospermia   . Hemorrhoids   . Anxiety   . Cancer [facility]) 2013    Malignant mole on his back removed   . Allergy   . Migraines     family Hx, triggered by stress  . Bronchitis     Surgical History: Past Surgical History  Procedure Laterality Date  . Skin lesions removed      Home Medications:    Medication List       This list is accurate as of: 07/08/15 11:34 AM.  Always use your most recent med list.               baclofen 10 MG tablet  Commonly known as:  LIORESAL  Take 1 tablet (10 mg total) by mouth 2 (two) times daily.     BIOTIN 5000 PO  Take by mouth.     cetirizine 1 MG/ML syrup  Commonly known as:  ZYRTEC  Take 10 mg by mouth daily. Reported on 03/06/2015     CO Q 10 PO  Take by mouth.     diazepam 5 MG tablet  Commonly known as:  VALIUM  Take 1 tablet (5 mg total) by mouth 3 times/day as needed-between meals & bedtime for anxiety or muscle spasms.     fexofenadine 180 MG tablet  Commonly known as:  ALLEGRA  Take 180 mg by mouth daily.     FINASTERIDE PO  Take by mouth.     FORTIFY DAILY PROBIOTIC PO  Take by mouth.     LYSINE HCL PO  Take by mouth.     mometasone 50 MCG/ACT nasal spray  Commonly known as:  NASONEX  Place 2 sprays into the  nose daily.     montelukast 10 MG tablet  Commonly known as:  SINGULAIR  Take 10 mg by mouth at bedtime.     ONE-A-DAY ESSENTIAL PO  Take by mouth.     SUMAtriptan 100 MG tablet  Commonly known as:  IMITREX        Allergies: No Known Allergies  Family History: Family History  Problem Relation Age of Onset  . Kidney disease Brother   . Prostate cancer Neg Hx     Social History:  reports that he has been smoking Cigarettes.  He has a 1 pack-year smoking history. He does not have any smokeless tobacco history on file. He reports that he drinks alcohol. He reports that he uses illicit drugs about twice per week.  ROS: UROLOGY Frequent Urination?: No Hard to postpone urination?: No Burning/pain with urination?: No Get up at night to urinate?: No Leakage of urine?: No Urine stream starts and stops?: No Trouble starting stream?: No Do you have to strain  to urinate?: No Blood in urine?: No Urinary tract infection?: No Sexually transmitted disease?: No Injury to kidneys or bladder?: No Painful intercourse?: No Weak stream?: No Erection problems?: No Penile pain?: No  Gastrointestinal Nausea?: No Vomiting?: No Indigestion/heartburn?: No Diarrhea?: No Constipation?: No  Constitutional Fever: No Night sweats?: No Weight loss?: Yes Fatigue?: Yes  Skin Skin rash/lesions?: No Itching?: No  Eyes Blurred vision?: No Double vision?: No  Ears/Nose/Throat Sore throat?: No Sinus problems?: No  Hematologic/Lymphatic Swollen glands?: No Easy bruising?: No  Cardiovascular Leg swelling?: No Chest pain?: No  Respiratory Cough?: No Shortness of breath?: No  Endocrine Excessive thirst?: No  Musculoskeletal Back pain?: No Joint pain?: No  Neurological Headaches?: Yes Dizziness?: Yes  Psychologic Depression?: Yes Anxiety?: Yes  Physical Exam: BP 153/101 mmHg  Pulse 98  Ht 5\' 11"  (1.803 m)  Wt 79.833 kg (176 lb)  BMI 24.56 kg/m2  Constitutional:   Alert and oriented, No acute distress. HEENT: [place] AT, moist mucus membranes.  Trachea midline, no masses. Cardiovascular: No clubbing, cyanosis, or edema. Respiratory: Normal respiratory effort, no increased work of breathing. GI: Abdomen is soft, nontender, nondistended, no abdominal masses GU: No CVA tenderness.  Skin: No rashes, bruises or suspicious lesions. Lymph: No cervical or inguinal adenopathy. Neurologic: Grossly intact, no focal deficits, moving all 4 extremities. Psychiatric: Normal mood and affect.  Laboratory Data: No results found for: WBC, HGB, HCT, MCV, PLT  No results found for: CREATININE  No results found for: PSA  No results found for: TESTOSTERONE  No results found for: HGBA1C  Urinalysis    Component Value Date/Time   APPEARANCEUR Clear 03/03/2015 1028   GLUCOSEU Negative 03/03/2015 1028   BILIRUBINUR Negative 03/03/2015 1028   PROTEINUR Negative 03/03/2015 1028   NITRITE Negative 03/03/2015 1028   LEUKOCYTESUR Negative 03/03/2015 1028    Pertinent Imaging: none  Assessment & Plan:    1. Pelvic floor dysfunction -continue valium and baclofen - continue PT -RTC 6 weeks  There are no diagnoses linked to this encounter.  No Follow-up on file.  Nicolette Bang, MD  [facility] [address], [place], [place] [address] [phone]

## 2015-07-15 ENCOUNTER — Encounter: Payer: Self-pay | Admitting: Urology

## 2015-08-14 ENCOUNTER — Telehealth: Payer: Self-pay | Admitting: Radiology

## 2015-08-14 DIAGNOSIS — M6289 Other specified disorders of muscle: Secondary | ICD-10-CM

## 2015-08-14 NOTE — Telephone Encounter (Signed)
Pt requests refills of baclofen & diazepam. He is scheduled to see Dr Alyson Ingles on 09/02/15. Please advise.

## 2015-08-19 ENCOUNTER — Ambulatory Visit: Payer: Managed Care, Other (non HMO)

## 2015-08-19 MED ORDER — DIAZEPAM 5 MG PO TABS: 5.0000 mg | ORAL_TABLET | Freq: Two times a day (BID) | ORAL | Status: DC | PRN

## 2015-08-19 MED ORDER — BACLOFEN 10 MG PO TABS: 10.0000 mg | ORAL_TABLET | Freq: Two times a day (BID) | ORAL | Status: DC

## 2015-08-19 NOTE — Telephone Encounter (Signed)
Per Dr. Alyson Ingles medications were refilled.

## 2015-08-20 ENCOUNTER — Other Ambulatory Visit: Payer: Self-pay

## 2015-08-20 DIAGNOSIS — M6289 Other specified disorders of muscle: Secondary | ICD-10-CM

## 2015-08-20 MED ORDER — DIAZEPAM 5 MG PO TABS: 5.0000 mg | ORAL_TABLET | Freq: Two times a day (BID) | ORAL | Status: AC | PRN

## 2015-09-02 ENCOUNTER — Ambulatory Visit (INDEPENDENT_AMBULATORY_CARE_PROVIDER_SITE_OTHER): Payer: Managed Care, Other (non HMO) | Admitting: Urology

## 2015-09-02 ENCOUNTER — Encounter: Payer: Self-pay | Admitting: Urology

## 2015-09-02 VITALS — BP 147/100 | HR 83 | Ht 71.0 in | Wt 173.2 lb

## 2015-09-02 DIAGNOSIS — M6258 Muscle wasting and atrophy, not elsewhere classified, other site: Secondary | ICD-10-CM

## 2015-09-02 DIAGNOSIS — R361 Hematospermia: Secondary | ICD-10-CM

## 2015-09-02 DIAGNOSIS — M6289 Other specified disorders of muscle: Secondary | ICD-10-CM

## 2015-09-02 MED ORDER — SULFAMETHOXAZOLE-TRIMETHOPRIM 800-160 MG PO TABS: 1.0000 | ORAL_TABLET | Freq: Two times a day (BID) | ORAL | 0 refills | Status: DC

## 2015-09-02 NOTE — Progress Notes (Signed)
09/02/2015 12:18 PM   Kirt Boys [place] 07/13/1970 [id number]  Referring provider: Lona Kettle, MD [place], [place] [address]  Chief Complaint  Patient presents with  . Follow-up    Pelvic floor dysfunction    HPI: Mr Orleans is a 45yo seen in followup for pelvic floor dysfunction. He noted hematospermia for the past 3 weeks. He has pain with ejaculation. He was seen pelvic floor PT.  He was treated with valium and baclofen but he has been taking only 1/2 the dose of the medication. He has new dysuria for the past 3 weeks. He has pain with sitting.      PMH: Past Medical History:  Diagnosis Date  . Allergy   . Anxiety   . Bronchitis   . Cancer [facility]) 2013   Malignant mole on his back removed   . Hematospermia   . Hemorrhoids   . Migraines    family Hx, triggered by stress  . Seasonal allergies     Surgical History: Past Surgical History:  Procedure Laterality Date  . skin lesions removed      Home Medications:    Medication List       Accurate as of 09/02/15 12:18 PM. Always use your most recent med list.          baclofen 10 MG tablet Commonly known as:  LIORESAL Take 1 tablet (10 mg total) by mouth 2 (two) times daily.   BIOTIN 5000 PO Take by mouth.   cetirizine 1 MG/ML syrup Commonly known as:  ZYRTEC Take 10 mg by mouth daily. Reported on 03/06/2015   CO Q 10 PO Take by mouth.   diazepam 5 MG tablet Commonly known as:  VALIUM Take 1 tablet (5 mg total) by mouth 3 times/day as needed-between meals & bedtime for anxiety or muscle spasms.   fexofenadine 180 MG tablet Commonly known as:  ALLEGRA Take 180 mg by mouth daily.   FINASTERIDE PO Take by mouth.   FORTIFY DAILY PROBIOTIC PO Take by mouth.   LYSINE HCL PO Take by mouth.   mometasone 50 MCG/ACT nasal spray Commonly known as:  NASONEX Place 2 sprays into the nose daily.   montelukast 10 MG tablet Commonly known as:  SINGULAIR Take 10 mg by mouth at  bedtime.   ONE-A-DAY ESSENTIAL PO Take by mouth.   SUMAtriptan 100 MG tablet Commonly known as:  IMITREX       Allergies: No Known Allergies  Family History: Family History  Problem Relation Age of Onset  . Kidney disease Brother   . Prostate cancer Neg Hx     Social History:  reports that he has been smoking Cigarettes.  He has a 1.00 pack-year smoking history. He does not have any smokeless tobacco history on file. He reports that he drinks alcohol. He reports that he uses drugs about 2 times per week.  ROS:                                        Physical Exam: BP (!) 147/100 (BP Location: Left Arm, Patient Position: Sitting, Cuff Size: Large)   Pulse 83   Ht 5\' 11"  (1.803 m)   Wt 78.6 kg (173 lb 3.2 oz)   BMI 24.16 kg/m   Constitutional:  Alert and oriented, No acute distress. HEENT: [place] AT, moist mucus membranes.  Trachea midline, no masses. Cardiovascular:  No clubbing, cyanosis, or edema. Respiratory: Normal respiratory effort, no increased work of breathing. GI: Abdomen is soft, nontender, nondistended, no abdominal masses GU: No CVA tenderness.  Skin: No rashes, bruises or suspicious lesions. Lymph: No cervical or inguinal adenopathy. Neurologic: Grossly intact, no focal deficits, moving all 4 extremities. Psychiatric: Normal mood and affect.  Laboratory Data: No results found for: WBC, HGB, HCT, MCV, PLT  No results found for: CREATININE  No results found for: PSA  No results found for: TESTOSTERONE  No results found for: HGBA1C  Urinalysis    Component Value Date/Time   APPEARANCEUR Clear 03/03/2015 1028   GLUCOSEU Negative 03/03/2015 1028   BILIRUBINUR Negative 03/03/2015 1028   PROTEINUR Negative 03/03/2015 1028   NITRITE Negative 03/03/2015 1028   LEUKOCYTESUR Negative 03/03/2015 1028    Pertinent Imaging:   Assessment & Plan:    1. Pelvic floor dysfunction -continue baclofen and valium - Urinalysis,  Complete  2. Hematospermia -bactrim DS BID for 30 days - Urinalysis, Complete   No Follow-up on file.  Nicolette Bang, MD  [facility] [address] [place], [place] [address] [phone]

## 2015-10-07 ENCOUNTER — Ambulatory Visit (INDEPENDENT_AMBULATORY_CARE_PROVIDER_SITE_OTHER): Payer: Managed Care, Other (non HMO) | Admitting: Urology

## 2015-10-07 VITALS — BP 165/99 | HR 58 | Ht 71.0 in | Wt 173.0 lb

## 2015-10-07 DIAGNOSIS — R361 Hematospermia: Secondary | ICD-10-CM | POA: Diagnosis not present

## 2015-10-07 DIAGNOSIS — R300 Dysuria: Secondary | ICD-10-CM | POA: Insufficient documentation

## 2015-10-07 MED ORDER — CEFPODOXIME PROXETIL 200 MG PO TABS: 200.0000 mg | ORAL_TABLET | Freq: Two times a day (BID) | ORAL | 0 refills | Status: DC

## 2015-10-07 NOTE — Progress Notes (Signed)
10/07/2015 9:16 AM   Paul Schroeder [birth year] [id number]  Referring provider: Lona Kettle, MD [place], [name] [address]  Chief Complaint  Patient presents with  . Follow-up    11month pelvic floor dysfunction    HPI: Paul Schroeder is a 45yo seen in followup for chronic prostatitis. He has been on 28 days of bactrim. He continues to have hematospermia, dysuria, urgency and frequency.  The pressure and sharp pain in his testicles started 1 week ago.    PMH: Past Medical History:  Diagnosis Date  . Allergy   . Anxiety   . Bronchitis   . Cancer [facility]) 2013   Malignant mole on his back removed   . Hematospermia   . Hemorrhoids   . Migraines    family Hx, triggered by stress  . Seasonal allergies     Surgical History: Past Surgical History:  Procedure Laterality Date  . skin lesions removed      Home Medications:    Medication List       Accurate as of 10/07/15  9:16 AM. Always use your most recent med list.          baclofen 10 MG tablet Commonly known as:  LIORESAL Take 1 tablet (10 mg total) by mouth 2 (two) times daily.   BIOTIN 5000 PO Take by mouth.   cetirizine 1 MG/ML syrup Commonly known as:  ZYRTEC Take 10 mg by mouth daily. Reported on 03/06/2015   CO Q 10 PO Take by mouth.   diazepam 5 MG tablet Commonly known as:  VALIUM Take 1 tablet (5 mg total) by mouth 3 times/day as needed-between meals & bedtime for anxiety or muscle spasms.   fexofenadine 180 MG tablet Commonly known as:  ALLEGRA Take 180 mg by mouth daily.   FINASTERIDE PO Take by mouth.   FORTIFY DAILY PROBIOTIC PO Take by mouth.   LYSINE HCL PO Take by mouth.   mometasone 50 MCG/ACT nasal spray Commonly known as:  NASONEX Place 2 sprays into the nose daily.   montelukast 10 MG tablet Commonly known as:  SINGULAIR Take 10 mg by mouth at bedtime.   ONE-A-DAY ESSENTIAL PO Take by mouth.   sulfamethoxazole-trimethoprim 800-160 MG  tablet Commonly known as:  BACTRIM DS,SEPTRA DS Take 1 tablet by mouth 2 (two) times daily.   SUMAtriptan 100 MG tablet Commonly known as:  IMITREX       Allergies: No Known Allergies  Family History: Family History  Problem Relation Age of Onset  . Kidney disease Brother   . Prostate cancer Neg Hx     Social History:  reports that he has been smoking Cigarettes.  He has a 1.00 pack-year smoking history. He does not have any smokeless tobacco history on file. He reports that he drinks alcohol. He reports that he uses drugs about 2 times per week.  ROS: UROLOGY Frequent Urination?: No Hard to postpone urination?: No Burning/pain with urination?: No Get up at night to urinate?: No Leakage of urine?: No Urine stream starts and stops?: No Trouble starting stream?: No Do you have to strain to urinate?: Yes Blood in urine?: No Urinary tract infection?: No Sexually transmitted disease?: No Injury to kidneys or bladder?: No Painful intercourse?: Yes Weak stream?: No Erection problems?: No Penile pain?: Yes  Gastrointestinal Nausea?: No Vomiting?: No Indigestion/heartburn?: No Diarrhea?: Yes Constipation?: Yes  Constitutional Fever: No Night sweats?: Yes Weight loss?: No Fatigue?: Yes  Skin Skin rash/lesions?: No Itching?: No  Eyes Blurred vision?: No Double vision?: No  Ears/Nose/Throat Sore throat?: No Sinus problems?: No  Hematologic/Lymphatic Swollen glands?: No Easy bruising?: No  Cardiovascular Leg swelling?: No Chest pain?: No  Respiratory Cough?: No Shortness of breath?: No  Endocrine Excessive thirst?: No  Musculoskeletal Back pain?: No Joint pain?: No  Neurological Headaches?: Yes Dizziness?: No  Psychologic Depression?: No Anxiety?: Yes  Physical Exam: BP (!) 165/99   Pulse (!) 58   Ht 5\' 11"  (1.803 m)   Wt 78.5 kg (173 lb)   BMI 24.13 kg/m   Constitutional:  Alert and oriented, No acute distress. HEENT: [place] AT,  moist mucus membranes.  Trachea midline, no masses. Cardiovascular: No clubbing, cyanosis, or edema. Respiratory: Normal respiratory effort, no increased work of breathing. GI: Abdomen is soft, nontender, nondistended, no abdominal masses GU: No CVA tenderness.  Skin: No rashes, bruises or suspicious lesions. Lymph: No cervical or inguinal adenopathy. Neurologic: Grossly intact, no focal deficits, moving all 4 extremities. Psychiatric: Normal mood and affect.  Laboratory Data: No results found for: WBC, HGB, HCT, MCV, PLT  No results found for: CREATININE  No results found for: PSA  No results found for: TESTOSTERONE  No results found for: HGBA1C  Urinalysis    Component Value Date/Time   APPEARANCEUR Clear 03/03/2015 1028   GLUCOSEU Negative 03/03/2015 1028   BILIRUBINUR Negative 03/03/2015 1028   PROTEINUR Negative 03/03/2015 1028   NITRITE Negative 03/03/2015 1028   LEUKOCYTESUR Negative 03/03/2015 1028    Pertinent Imaging: none  Assessment & Plan:   1. Hematospermia with acute prostatitis -Vantin 200mg  BID for 28 days -RTC 2 weeks  There are no diagnoses linked to this encounter.  No Follow-up on file.  Nicolette Bang, MD  [facility] [address], [place], [place] [address] [phone]

## 2015-10-30 ENCOUNTER — Ambulatory Visit (INDEPENDENT_AMBULATORY_CARE_PROVIDER_SITE_OTHER): Payer: Managed Care, Other (non HMO) | Admitting: Urology

## 2015-10-30 ENCOUNTER — Encounter: Payer: Self-pay | Admitting: Urology

## 2015-10-30 VITALS — BP 155/100 | HR 87 | Ht 70.0 in | Wt 169.5 lb

## 2015-10-30 DIAGNOSIS — M6289 Other specified disorders of muscle: Secondary | ICD-10-CM

## 2015-10-30 DIAGNOSIS — M6258 Muscle wasting and atrophy, not elsewhere classified, other site: Secondary | ICD-10-CM

## 2015-10-30 NOTE — Progress Notes (Signed)
10/30/2015 10:56 AM   Paul Schroeder 16-Feb-1970 [id number]  Referring provider: Lona Kettle, MD [place], [place] [address]  Chief Complaint  Patient presents with  . Follow-up    Hematospermia    HPI: The patient is a 45 year old gentleman with a history of chronic prostatitis. He presents today after being started on Vantin approximately one month ago. He is undergone pelvic floor physical therapy in the past.  He also has a history of hematospermia.The patient notes discomfort in the right lower quadrant. It radiates to his testicle. He also has intermittent hematospermia. His biggest complaint is dysuria as well as his pain. He feels that salt and spicy foods makes the pain worse. Nothing makes it better. He also has trouble sleeping at night.  PMH: Past Medical History:  Diagnosis Date  . Allergy   . Anxiety   . Bronchitis   . Cancer [facility]) 2013   Malignant mole on his back removed   . Hematospermia   . Hemorrhoids   . Migraines    family Hx, triggered by stress  . Seasonal allergies     Surgical History: Past Surgical History:  Procedure Laterality Date  . skin lesions removed      Home Medications:    Medication List       Accurate as of 10/30/15 10:56 AM. Always use your most recent med list.          baclofen 10 MG tablet Commonly known as:  LIORESAL Take 1 tablet (10 mg total) by mouth 2 (two) times daily.   BIOTIN 5000 PO Take by mouth.   cefpodoxime 200 MG tablet Commonly known as:  VANTIN Take 1 tablet (200 mg total) by mouth 2 (two) times daily.   cetirizine 1 MG/ML syrup Commonly known as:  ZYRTEC Take 10 mg by mouth daily. Reported on 03/06/2015   CO Q 10 PO Take by mouth.   diazepam 5 MG tablet Commonly known as:  VALIUM Take 1 tablet (5 mg total) by mouth 3 times/day as needed-between meals & bedtime for anxiety or muscle spasms.   fexofenadine 180 MG tablet Commonly known as:  ALLEGRA Take 180 mg by mouth  daily.   FINASTERIDE PO Take by mouth.   FORTIFY DAILY PROBIOTIC PO Take by mouth.   LYSINE HCL PO Take by mouth.   mometasone 50 MCG/ACT nasal spray Commonly known as:  NASONEX Place 2 sprays into the nose daily.   montelukast 10 MG tablet Commonly known as:  SINGULAIR Take 10 mg by mouth at bedtime.   ONE-A-DAY ESSENTIAL PO Take by mouth.   SUMAtriptan 100 MG tablet Commonly known as:  IMITREX       Allergies: No Known Allergies  Family History: Family History  Problem Relation Age of Onset  . Kidney disease Brother   . Prostate cancer Neg Hx     Social History:  reports that he has been smoking Cigarettes.  He has a 1.00 pack-year smoking history. He does not have any smokeless tobacco history on file. He reports that he drinks alcohol. He reports that he uses drugs about 2 times per week.  ROS: UROLOGY Frequent Urination?: No Hard to postpone urination?: No Burning/pain with urination?: Yes Get up at night to urinate?: No Leakage of urine?: No Urine stream starts and stops?: No Trouble starting stream?: No Do you have to strain to urinate?: No Blood in urine?: No Urinary tract infection?: No Sexually transmitted disease?: No Injury to kidneys  or bladder?: No Painful intercourse?: No Weak stream?: No Erection problems?: No Penile pain?: Yes  Gastrointestinal Nausea?: No Vomiting?: No Indigestion/heartburn?: No Diarrhea?: Yes Constipation?: Yes  Constitutional Fever: No Night sweats?: No Weight loss?: Yes Fatigue?: Yes  Skin Skin rash/lesions?: No Itching?: No  Eyes Blurred vision?: No Double vision?: No  Ears/Nose/Throat Sore throat?: No Sinus problems?: No  Hematologic/Lymphatic Swollen glands?: No Easy bruising?: No  Cardiovascular Leg swelling?: No Chest pain?: No  Respiratory Cough?: No Shortness of breath?: No  Endocrine Excessive thirst?: No  Musculoskeletal Back pain?: No Joint pain?:  No  Neurological Headaches?: Yes Dizziness?: Yes  Psychologic Depression?: No Anxiety?: Yes  Physical Exam: BP (!) 155/100   Pulse 87   Ht 5\' 10"  (1.778 m)   Wt 169 lb 8 oz (76.9 kg)   BMI 24.32 kg/m   Constitutional:  Alert and oriented, No acute distress. HEENT: [place] AT, moist mucus membranes.  Trachea midline, no masses. Cardiovascular: No clubbing, cyanosis, or edema. Respiratory: Normal respiratory effort, no increased work of breathing. GI: Abdomen is soft, nontender, nondistended, no abdominal masses GU: No CVA tenderness. Right lower quadrant tenderness palpation. Seems musculoskeletal in origin. Right spermatic cord tender to palpation. Normal testicles. Normal phallus. Testicles nontender to palpation bilaterally. Skin: No rashes, bruises or suspicious lesions. Lymph: No cervical or inguinal adenopathy. Neurologic: Grossly intact, no focal deficits, moving all 4 extremities. Psychiatric: Normal mood and affect.  Laboratory Data: No results found for: WBC, HGB, HCT, MCV, PLT  No results found for: CREATININE  No results found for: PSA  No results found for: TESTOSTERONE  No results found for: HGBA1C  Urinalysis    Component Value Date/Time   APPEARANCEUR Clear 03/03/2015 1028   GLUCOSEU Negative 03/03/2015 1028   BILIRUBINUR Negative 03/03/2015 1028   PROTEINUR Negative 03/03/2015 1028   NITRITE Negative 03/03/2015 1028   LEUKOCYTESUR Negative 03/03/2015 1028      Assessment & Plan:    1. Pelvic floor dysfunction I discussed with the patient  His pelvic floor dysfunction.It seems to manage exam that itoriginates iht lower quadrant and maybe related to a muscle sprain or tear. I recommended that he ices this area at least twice per day whether he needs it or not. I also recommended that he take ibuprofen 600 mg 3 times daily on a scheduled basis. He will continue his Valium and baclofen as needed. He'll follow-up in approximately 1 month to see if this  helps with his pain.I did discuss that this can often be a chronic problem that is somewhat difficult to treat. We may consider  gabapentin at his next visit if his symptoms do not resolve.  Return in about 4 weeks (around 11/27/2015).  Nickie Retort, MD  [facility] [address], [place], [place] [address] [phone]

## 2015-11-25 ENCOUNTER — Ambulatory Visit: Payer: Managed Care, Other (non HMO)

## 2016-07-28 ENCOUNTER — Other Ambulatory Visit: Payer: Self-pay

## 2016-07-28 DIAGNOSIS — M6289 Other specified disorders of muscle: Secondary | ICD-10-CM

## 2016-08-06 ENCOUNTER — Telehealth: Payer: Self-pay | Admitting: Urology

## 2016-08-06 ENCOUNTER — Encounter: Payer: Self-pay | Admitting: Urology

## 2016-08-06 DIAGNOSIS — M6289 Other specified disorders of muscle: Secondary | ICD-10-CM

## 2016-08-06 NOTE — Telephone Encounter (Signed)
Pt called and would like an Rx refill for Baclofen 10 mg

## 2016-08-06 NOTE — Telephone Encounter (Signed)
This encounter was created in error - please disregard.  This encounter was created in error - please disregard.

## 2016-08-09 MED ORDER — BACLOFEN 10 MG PO TABS: 10.0000 mg | ORAL_TABLET | Freq: Two times a day (BID) | ORAL | 0 refills | Status: DC

## 2016-08-09 NOTE — Telephone Encounter (Signed)
Spoke to pt and advised he would need to come in for OV follow up to continue management of meds. Pt verbalized understanding.  Pt says his sx(s) have returned, at work, will call back to schedule a follow up appt.

## 2016-08-09 NOTE — Addendum Note (Signed)
Addended by: Leia Alf on: 08/09/2016 03:03 PM   Modules accepted: Orders

## 2016-08-09 NOTE — Telephone Encounter (Signed)
This encounter was created in error - please disregard.

## 2016-08-09 NOTE — Addendum Note (Signed)
Addended by: Leia Alf on: 08/09/2016 03:35 PM   Modules accepted: Orders, Level of Service, SmartSet

## 2016-08-10 ENCOUNTER — Other Ambulatory Visit: Payer: Self-pay | Admitting: Urology

## 2016-08-12 ENCOUNTER — Encounter: Payer: Self-pay | Admitting: Family Medicine

## 2016-08-12 ENCOUNTER — Ambulatory Visit (INDEPENDENT_AMBULATORY_CARE_PROVIDER_SITE_OTHER): Payer: Managed Care, Other (non HMO) | Admitting: Family Medicine

## 2016-08-12 ENCOUNTER — Telehealth: Payer: Self-pay | Admitting: Family Medicine

## 2016-08-12 ENCOUNTER — Other Ambulatory Visit: Payer: Self-pay

## 2016-08-12 VITALS — BP 151/99 | HR 82 | Ht 70.0 in | Wt 173.4 lb

## 2016-08-12 DIAGNOSIS — R030 Elevated blood-pressure reading, without diagnosis of hypertension: Secondary | ICD-10-CM

## 2016-08-12 DIAGNOSIS — J3089 Other allergic rhinitis: Secondary | ICD-10-CM

## 2016-08-12 DIAGNOSIS — Z716 Tobacco abuse counseling: Secondary | ICD-10-CM | POA: Insufficient documentation

## 2016-08-12 DIAGNOSIS — Z862 Personal history of diseases of the blood and blood-forming organs and certain disorders involving the immune mechanism: Secondary | ICD-10-CM

## 2016-08-12 DIAGNOSIS — Z8639 Personal history of other endocrine, nutritional and metabolic disease: Secondary | ICD-10-CM

## 2016-08-12 DIAGNOSIS — F17200 Nicotine dependence, unspecified, uncomplicated: Secondary | ICD-10-CM | POA: Insufficient documentation

## 2016-08-12 DIAGNOSIS — Z8659 Personal history of other mental and behavioral disorders: Secondary | ICD-10-CM | POA: Insufficient documentation

## 2016-08-12 DIAGNOSIS — G43909 Migraine, unspecified, not intractable, without status migrainosus: Secondary | ICD-10-CM | POA: Insufficient documentation

## 2016-08-12 DIAGNOSIS — J302 Other seasonal allergic rhinitis: Secondary | ICD-10-CM

## 2016-08-12 DIAGNOSIS — C439 Malignant melanoma of skin, unspecified: Secondary | ICD-10-CM

## 2016-08-12 DIAGNOSIS — Z818 Family history of other mental and behavioral disorders: Secondary | ICD-10-CM | POA: Insufficient documentation

## 2016-08-12 DIAGNOSIS — I10 Essential (primary) hypertension: Secondary | ICD-10-CM | POA: Insufficient documentation

## 2016-08-12 DIAGNOSIS — F4323 Adjustment disorder with mixed anxiety and depressed mood: Secondary | ICD-10-CM | POA: Insufficient documentation

## 2016-08-12 MED ORDER — FEXOFENADINE HCL 180 MG PO TABS
180.0000 mg | ORAL_TABLET | Freq: Every day | ORAL | 1 refills | Status: DC
Start: 2016-08-12 — End: 2017-08-26

## 2016-08-12 NOTE — Telephone Encounter (Signed)
Pt called back states he went to [facility] [address] to pick up Rx but discovered he had given the incorrect name of medicine to PCP-- Pt needs (Finasteride PO) instead of the Fexofenadine--Pt apologizes for mix up. --glh

## 2016-08-12 NOTE — Progress Notes (Signed)
New patient office visit note:  Impression and Recommendations:    1. Elevated blood pressure reading- white coat   2. Environmental and seasonal allergies   3. Seasonal allergic rhinitis, unspecified trigger   4. H/O non anemic vitamin B12 deficiency   5. H/O iron deficiency anemia   6. Tobacco use disorder- approximately 28-pack-year history   7. Encounter for counseling for tobacco use disorder    1. Asked patient to please monitor his blood pressure at home, write it down and keep a log.  Bring in next office visit.  Normal blood pressure reviewed with patient of less than 130/80 on a regular basis.   2. Prescription for patient Allegra sent in to pharmacy.  Advised to use a Nettie pot for sinus rinses and flushes twice a day 3. Use Nasonex or Flonase daily 4. We'll obtain B12 level near future 5. Will obtain CBC near future 6. Tobacco cessation counseling done, patient not ready to quit.  Will consider in future.  Educational information on quitting given to patient.  3-5 minutes spent on counseling  7. Next office visit:  Patient prefers to come in for yearly physical and obtain fasting blood work near future.  Then we will see him back in follow-up office visit to discuss the lab work and also his anxiety and depressed mood.   He is stable- declines counseling as he says mood is not that bad but would like to discuss next OV.      ---> We will obtain full set of labs, also including TSH, T4, vitamin D and B12.   The patient was counseled, risk factors were discussed, anticipatory guidance given.   Return for CPE - come fasting near future. Then OV couple wks later to discuss bldwrk /mood.  Please see AVS handed out to patient at the end of our visit for further patient instructions/ counseling done pertaining to today's office visit.    Note: This document was prepared using Dragon voice recognition software and may include unintentional dictation  errors.  ----------------------------------------------------------------------------------------------------------------------    Subjective:    Chief complaint:   Chief Complaint  Patient presents with  . Establish Care     HPI: Paul Schroeder is a pleasant 46 y.o. male who presents to [place] [facility] today to review their medical history with me and establish care.   I asked the patient to review their chronic problem list with me to ensure everything was updated and accurate.    All recent office visits with other providers, any medical records that patient brought in etc  - I reviewed today.     Also asked pt to get me medical records from [facility] providers/ specialists that they had seen within the past 3-5 years- if they are in private practice and/or do not work for a [employer], [facility], [place], [place] or [employer] owned practice.  Told them to call their specialists to clarify this if they are not sure.   PCP- prior-  [facility]- Dr Melinda Crutch.     1 yr ago- weighed  240 lbs--> got sick, couldn't eat a lot- pain would make him throw up-->  Couldn't eat, no appetite. Dx with Pelvic floor damage-  And gen sx.    Did do some hcg injections.  Also did B12 vitamin injections for stamina/ energy- stopped that now- did for 6 mo.  No carb diet.    Smoking cig- about 28 pk yr hx.  Quit in past-cold [place].    Melanoma skin cancer- dx 2015-> on back , abd and trunk.    Diazepam by URology- for pelvic  Problem  Elevated blood pressure reading- white coat   Averages at home 120's/ 90's.    Tobacco use disorder- approximately 28-pack-year history  History of Panic Attacks  Adjustment disorder with mixed anxiety and depressed mood- since teenager  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 )    Once on baclofen--> migraines stopped.    Pelvic floor dysfunction- treated by urology   Ibuprofen 3 tab TID along with 1/2 baclofen daily for about 1 year  now.  NO stomach problems    Environmental and Seasonal Allergies  Seasonal Allergic Rhinitis  H/O Non Anemic Vitamin B12 Deficiency  H/O Iron Deficiency Anemia  Encounter for Counseling for Tobacco Use Disorder  Family history of mood disorder- MOM      Wt Readings from Last 3 Encounters:  08/12/16 173 lb 6.4 oz (78.7 kg)  10/30/15 169 lb 8 oz (76.9 kg)  10/07/15 173 lb (78.5 kg)   BP Readings from Last 3 Encounters:  08/12/16 (!) 151/99  10/30/15 (!) 155/100  10/07/15 (!) 165/99   Pulse Readings from Last 3 Encounters:  08/12/16 82  10/30/15 87  10/07/15 (!) 58   BMI Readings from Last 3 Encounters:  08/12/16 24.88 kg/m  10/30/15 24.32 kg/m  10/07/15 24.13 kg/m    Patient Care Team    Relationship Specialty Notifications Start End  Mellody Dance, DO PCP - General Family Medicine  08/12/16   Nori Riis, PA-C Physician Assistant Urology  03/10/15    Comment: REF PA  TO DR Jamal Collin 03-10-15  UNDER DR Cyndra Numbers, MD  General Surgery  03/10/15   Allyn Kenner, MD  Dermatology  08/12/16   Cleon Gustin, MD Consulting Physician Urology  08/12/16     Patient Active Problem List   Diagnosis Date Noted  . Elevated blood pressure reading- white coat 08/12/2016    Priority: High  . Tobacco use disorder- approximately 28-pack-year history 08/12/2016    Priority: High  . History of panic attacks 08/12/2016    Priority: High  . Adjustment disorder with mixed anxiety and depressed mood- since teenager 08/12/2016    Priority: High  . 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 )  08/12/2016    Priority: Medium  . Pelvic floor dysfunction- treated by urology 04/22/2015    Priority: Medium  . Environmental and seasonal allergies 08/12/2016    Priority: Low  . Seasonal allergic rhinitis 08/12/2016    Priority: Low  . H/O non anemic vitamin B12 deficiency 08/12/2016    Priority: Low  . H/O iron deficiency anemia 08/12/2016     Priority: Low  . Encounter for counseling for tobacco use disorder 08/12/2016    Priority: Low  . Family history of mood disorder- MOM 08/12/2016    Priority: Low  . Dysuria 10/07/2015  . Right groin pain 03/05/2015  . Hematospermia 02/22/2015  . Epididymitis, right 02/22/2015     Past Medical History:  Diagnosis Date  . Allergy   . Anxiety   . Bronchitis   . Cancer [facility]) 2013   Malignant mole on his back removed   . Hematospermia   . Hemorrhoids   . Migraines    family Hx, triggered by stress  . Seasonal allergies      Past Medical History:  Diagnosis Date  .  Allergy   . Anxiety   . Bronchitis   . Cancer [facility]) 2013   Malignant mole on his back removed   . Hematospermia   . Hemorrhoids   . Migraines    family Hx, triggered by stress  . Seasonal allergies      Past Surgical History:  Procedure Laterality Date  . skin lesions removed       Family History  Problem Relation Age of Onset  . Hypertension Mother   . Hyperlipidemia Mother   . Stroke Mother   . Depression Mother   . Kidney disease Brother   . Kidney disease Maternal Grandfather   . Heart disease Maternal Grandfather   . Prostate cancer Neg Hx      History  Drug Use  . Frequency: 2.0 times per week    Comment: marijuana     History  Alcohol Use  . 2.4 oz/week  . 4 Shots of liquor per week     History  Smoking Status  . Current Every Day Smoker  . Packs/day: 1.00  . Years: 3.00  . Types: Cigarettes  Smokeless Tobacco  . Never Used     Outpatient Encounter Prescriptions as of 08/12/2016  Medication Sig Note  . baclofen (LIORESAL) 10 MG tablet Take 1 tablet (10 mg total) by mouth 2 (two) times daily.   Marland Kitchen BIOTIN 5000 PO Take by mouth.   . cetirizine (ZYRTEC) 1 MG/ML syrup Take 10 mg by mouth daily. Reported on 03/06/2015   . diazepam (VALIUM) 5 MG tablet Take 1 tablet (5 mg total) by mouth 3 times/day as needed-between meals & bedtime for anxiety or muscle spasms.   .  fexofenadine (ALLEGRA) 180 MG tablet Take 1 tablet (180 mg total) by mouth daily.   Marland Kitchen FINASTERIDE PO Take by mouth.   . mometasone (NASONEX) 50 MCG/ACT nasal spray Place 2 sprays into the nose daily.   . montelukast (SINGULAIR) 10 MG tablet Take 10 mg by mouth at bedtime.   . Multiple Vitamin (ONE-A-DAY ESSENTIAL PO) Take by mouth.   . Probiotic Product (FORTIFY DAILY PROBIOTIC PO) Take by mouth.   . SUMAtriptan (IMITREX) 100 MG tablet  02/21/2015: Received from: External Pharmacy  . [DISCONTINUED] fexofenadine (ALLEGRA) 180 MG tablet Take 180 mg by mouth daily.   . [DISCONTINUED] cefpodoxime (VANTIN) 200 MG tablet Take 1 tablet (200 mg total) by mouth 2 (two) times daily.   . [DISCONTINUED] Coenzyme Q10 (CO Q 10 PO) Take by mouth.   . [DISCONTINUED] LYSINE HCL PO Take by mouth.    No facility-administered encounter medications on file as of 08/12/2016.     Allergies: Patient has no known allergies.   Review of Systems  Constitutional: Negative for chills, diaphoresis, fever, malaise/fatigue and weight loss.  HENT: Negative for congestion, sore throat and tinnitus.   Eyes: Negative for blurred vision, double vision and photophobia.  Respiratory: Negative for cough and wheezing.   Cardiovascular: Negative for chest pain and palpitations.  Gastrointestinal: Negative for blood in stool, diarrhea, nausea and vomiting.  Genitourinary: Negative for dysuria, frequency and urgency.  Musculoskeletal: Positive for joint pain and myalgias.       Chronic due to his pelvic floor dysfunction  Skin: Negative for itching and rash.  Neurological: Positive for headaches. Negative for dizziness, focal weakness and weakness.       History of migraine/  ocular migraine with aura associated with nausea vomiting  Endo/Heme/Allergies: Positive for environmental allergies. Negative for polydipsia. Does not bruise/bleed  easily.  Psychiatric/Behavioral: Positive for depression. Negative for memory loss, substance  abuse and suicidal ideas. The patient is nervous/anxious. The patient does not have insomnia.      Objective:   Blood pressure (!) 151/99, pulse 82, height 5\' 10"  (1.778 m), weight 173 lb 6.4 oz (78.7 kg). Body mass index is 24.88 kg/m. General: Well Developed, well nourished, and in no acute distress.  Neuro: Alert and oriented x3, extra-ocular muscles intact, sensation grossly intact.  HEENT:[place]/AT, PERRLA, neck supple, No carotid bruits Skin: no gross rashes  Cardiac: Regular rate and rhythm Respiratory: Essentially clear to auscultation bilaterally. Not using accessory muscles, speaking in full sentences.  Abdominal: not grossly distended Musculoskeletal: Ambulates w/o diff, FROM * 4 ext.  Vasc: less 2 sec cap RF, warm and pink  Psych:  No HI/SI, judgement and insight good, Euthymic mood. Full Affect.

## 2016-08-12 NOTE — Patient Instructions (Addendum)
[email or web address]  --- It is important that you monitor your blood pressure at home in order for [place] to establish whether or not you have high blood pressure or white coat syndrome.      Please take a Zantac 150 mg twice daily along with your ibuprofen regimen that your urologist has you on.  This should be taken daily while you're taking the NSAID to help protect her stomach from gastropathy and gastric ulcers etc.      Please realize, EXERCISE IS MEDICINE!  -  American Heart Association [facility]) guidelines for exercise : If you are in good health, without any medical conditions, you should engage in 150 minutes of moderate intensity aerobic activity per week.  This means you should be huffing and puffing throughout your workout.   Engaging in regular exercise will improve brain function and memory, as well as improve mood, boost immune system and help with weight management.  As well as the other, more well-known effects of exercise such as decreasing blood sugar levels, decreasing blood pressure,  and decreasing bad cholesterol levels/ increasing good cholesterol levels.     -  The AHA strongly endorses consumption of a diet that contains a variety of foods from all the food categories with an emphasis on fruits and vegetables; fat-free and low-fat dairy products; cereal and grain products; legumes and nuts; and fish, poultry, and/or extra lean meats.    Excessive food intake, especially of foods high in saturated and trans fats, sugar, and salt, should be avoided.    Adequate water intake of roughly 1/2 of your weight in pounds, should equal the ounces of water per day you should drink.  So for instance, if you're 200 pounds, that would be 100 ounces of water per day.         Mediterranean Diet  Why follow it? Research shows. . Those who follow the  Mediterranean diet have a reduced risk of heart disease  . The diet is associated with a reduced incidence of Parkinson's and Alzheimer's diseases . People following the diet may have longer life expectancies and lower rates of chronic diseases  . The Dietary Guidelines for Americans recommends the Mediterranean diet as an eating plan to promote health and prevent disease  What Is the Mediterranean Diet?  . Healthy eating plan based on typical foods and recipes of Mediterranean-style cooking . The diet is primarily a plant based diet; these foods should make up a majority of meals   Starches - Plant based foods should make up a majority of meals - They are an important sources of vitamins, minerals, energy, antioxidants, and fiber - Choose whole grains, foods high in fiber and minimally processed items  - Typical grain sources include wheat, oats, barley, corn, brown rice, bulgar, farro, millet, polenta, couscous  - Various types of beans include chickpeas, lentils, fava beans, black beans, white beans   Fruits  Veggies - Large quantities of antioxidant rich fruits & veggies; 6 or more servings  - Vegetables can be eaten raw or lightly drizzled with oil and cooked  - Vegetables common to the traditional Mediterranean Diet include: artichokes, arugula, beets, broccoli, brussel sprouts, cabbage, carrots, celery, collard greens, cucumbers, eggplant, kale, leeks, lemons, lettuce, mushrooms, okra, onions, peas, peppers, potatoes, pumpkin, radishes, rutabaga, shallots, spinach, sweet potatoes, turnips, zucchini - Fruits common to the Mediterranean Diet include: apples, apricots, avocados, cherries, clementines, dates, figs, grapefruits, grapes, melons, nectarines, oranges, peaches, pears, pomegranates, strawberries, tangerines  Fats - Replace butter  and margarine with healthy oils, such as olive oil, canola oil, and tahini  - Limit nuts to no more than a handful a day  - Nuts include walnuts, almonds,  pecans, pistachios, pine nuts  - Limit or avoid candied, honey roasted or heavily salted nuts - Olives are central to the Mediterranean diet - can be eaten whole or used in a variety of dishes   Meats Protein - Limiting red meat: no more than a few times a month - When eating red meat: choose lean cuts and keep the portion to the size of deck of cards - Eggs: approx. 0 to 4 times a week  - Fish and lean poultry: at least 2 a week  - Healthy protein sources include, chicken, [place], lean beef, lamb - Increase intake of seafood such as tuna, salmon, trout, mackerel, shrimp, scallops - Avoid or limit high fat processed meats such as sausage and bacon  Dairy - Include moderate amounts of low fat dairy products  - Focus on healthy dairy such as fat free yogurt, skim milk, low or reduced fat cheese - Limit dairy products higher in fat such as whole or 2% milk, cheese, ice cream  Alcohol - Moderate amounts of red wine is ok  - No more than 5 oz daily for women (all ages) and men older than age 27  - No more than 10 oz of wine daily for men younger than 90  Other - Limit sweets and other desserts  - Use herbs and spices instead of salt to flavor foods  - Herbs and spices common to the traditional Mediterranean Diet include: basil, bay leaves, chives, cloves, cumin, fennel, garlic, lavender, marjoram, mint, oregano, parsley, pepper, rosemary, sage, savory, sumac, tarragon, thyme   It's not just a diet, it's a lifestyle:  . The Mediterranean diet includes lifestyle factors typical of those in the region  . Foods, drinks and meals are best eaten with others and savored . Daily physical activity is important for overall good health . This could be strenuous exercise like running and aerobics . This could also be more leisurely activities such as walking, housework, yard-work, or taking the stairs . Moderation is the key; a balanced and healthy diet accommodates most foods and drinks . Consider portion  sizes and frequency of consumption of certain foods   Meal Ideas & Options:  . Breakfast:  o Whole wheat toast or whole wheat English muffins with peanut butter & hard boiled egg o Steel cut oats topped with apples & cinnamon and skim milk  o Fresh fruit: banana, strawberries, melon, berries, peaches  o Smoothies: strawberries, bananas, greek yogurt, peanut butter o Low fat greek yogurt with blueberries and granola  o Egg white omelet with spinach and mushrooms o Breakfast couscous: whole wheat couscous, apricots, skim milk, cranberries  . Sandwiches:  o Hummus and grilled vegetables (peppers, zucchini, squash) on whole wheat bread   o Grilled chicken on whole wheat pita with lettuce, tomatoes, cucumbers or tzatziki  o Tuna salad on whole wheat bread: tuna salad made with greek yogurt, olives, red peppers, capers, green onions o Garlic rosemary lamb pita: lamb sauted with garlic, rosemary, salt & pepper; add lettuce, cucumber, greek yogurt to pita - flavor with lemon juice and black pepper  . Seafood:  o Mediterranean grilled salmon, seasoned with garlic, basil, parsley, lemon juice and black pepper o Shrimp, lemon, and spinach whole-grain pasta salad made with low fat greek yogurt  o Seared scallops  with lemon orzo  o Seared tuna steaks seasoned salt, pepper, coriander topped with tomato mixture of olives, tomatoes, olive oil, minced garlic, parsley, green onions and cappers  . Meats:  o Herbed greek chicken salad with kalamata olives, cucumber, feta  o Red bell peppers stuffed with spinach, bulgur, lean ground beef (or lentils) & topped with feta   o Kebabs: skewers of chicken, tomatoes, onions, zucchini, squash  o [place] burgers: made with red onions, mint, dill, lemon juice, feta cheese topped with roasted red peppers . Vegetarian o Cucumber salad: cucumbers, artichoke hearts, celery, red onion, feta cheese, tossed in olive oil & lemon juice  o Hummus and whole grain pita points with  a greek salad (lettuce, tomato, feta, olives, cucumbers, red onion) o Lentil soup with celery, carrots made with vegetable broth, garlic, salt and pepper  o Tabouli salad: parsley, bulgur, mint, scallions, cucumbers, tomato, radishes, lemon juice, olive oil, salt and pepper.

## 2016-08-13 NOTE — Telephone Encounter (Signed)
He will need to get his finasteride from one of his his 2 urology doctors that he sees

## 2016-08-13 NOTE — Telephone Encounter (Signed)
Called patient left message to call the office back. MPulliam, CMA/RT(R)  

## 2016-08-16 NOTE — Telephone Encounter (Signed)
Mychart message sent to patient.

## 2016-08-23 ENCOUNTER — Encounter: Payer: Self-pay | Admitting: Family Medicine

## 2016-08-23 ENCOUNTER — Ambulatory Visit (INDEPENDENT_AMBULATORY_CARE_PROVIDER_SITE_OTHER): Payer: Managed Care, Other (non HMO) | Admitting: Family Medicine

## 2016-08-23 ENCOUNTER — Telehealth: Payer: Self-pay | Admitting: Family Medicine

## 2016-08-23 VITALS — BP 141/93 | HR 80 | Ht 70.0 in | Wt 177.0 lb

## 2016-08-23 DIAGNOSIS — L649 Androgenic alopecia, unspecified: Secondary | ICD-10-CM | POA: Diagnosis not present

## 2016-08-23 DIAGNOSIS — Z719 Counseling, unspecified: Secondary | ICD-10-CM | POA: Diagnosis not present

## 2016-08-23 DIAGNOSIS — Z1389 Encounter for screening for other disorder: Secondary | ICD-10-CM

## 2016-08-23 DIAGNOSIS — Z716 Tobacco abuse counseling: Secondary | ICD-10-CM | POA: Diagnosis not present

## 2016-08-23 DIAGNOSIS — F17200 Nicotine dependence, unspecified, uncomplicated: Secondary | ICD-10-CM

## 2016-08-23 DIAGNOSIS — Z0000 Encounter for general adult medical examination without abnormal findings: Secondary | ICD-10-CM | POA: Diagnosis not present

## 2016-08-23 MED ORDER — BUPROPION HCL ER (XL) 150 MG PO TB24: 150.0000 mg | ORAL_TABLET | ORAL | 0 refills | Status: DC

## 2016-08-23 MED ORDER — FINASTERIDE 5 MG PO TABS: ORAL_TABLET | ORAL | 2 refills | Status: DC

## 2016-08-23 NOTE — Patient Instructions (Addendum)
Please call back with the dose of finasteride that you take in exactly how it is written on your bottle.  That way we can refill it for your hair loss.  - Please give patient stool cards for home use.    Please think about the times when you really want to smoke and what you do during those times.  You need to change your habits and not do those things.  So, if drinking coffee you always had a cigarette maybe you need to give up coffee..  I want you to focus on and think about creating new habits for yourself and become emotionally invested in quitting in order for you to be successful.   Please think seriously about quitting smoking!  This is very important for your health and well being.   Smoking cessation instruction/counseling given:  counseled patient on the dangers of tobacco use, advised patient to stop smoking, and reviewed strategies to maximize success  Discussed with patient that there are multiple treatments to aid in quitting smoking, however I explained none will work unless pt really wants to quit  Please let us know in the future if you are interested and ready to quit.  You can also call 1-800-QUIT-NOW (402)234-5960) for free smoking cessation counseling and support.     Also, please go online to www.heart.org (the American Heart Association website) and search "quit smoking ".    Or try the centers for disease control website at: https://www.schmidt.com/  There is a lot of great information on these websites for you to look over.      Want to Quit Smoking? FDA-Approved Products Can Help  Quitting smoking can be hard, but it is possible. In fact, every time you put out a cigarette is a new chance to try quitting again, according to the U.S. Food and Drug Administration's newest tobacco education campaign, "Every Try Counts."   If you want to quit-almost 70 percent of adult smokers say they do-you may want to use a "smoking cessation"  product proven to help. Data has shown that using FDA-approved cessation medicine can double your chance of quitting successfully.  Some products contain nicotine as an active ingredient and others do not. These products include over-the-counter (OTC) options like skin patches, lozenges, and gum, as well as prescription medicines.  Smoking cessation products are intended to help you quit smoking. They are regulated through the [facility], which ensures that the products are safe and effective and that their benefits outweigh any known associated risks.  The Benefits of Quitting Smoking No matter how much you smoke-or for how long-quitting will benefit you.  Not only will you lower your risk of getting various cancers, including lung cancer, you'll also reduce your chances of having heart disease, a stroke, emphysema, and other serious diseases. Quitting also will lower the risk of heart disease and lung cancer in nonsmokers who otherwise would be exposed to your secondhand smoke.  Although there are benefits to quitting at any age, it is important to quit as soon as possible so your body can begin to heal from the damage caused by smoking. For instance, 12 hours after you quit smoking the carbon monoxide level in your blood drops to normal. Carbon monoxide is harmful because it displaces oxygen in the blood and deprives your heart, brain, and other vital organs of oxygen.  What To Know About Smoking Cessation Products Understanding how smoking cessation products work-and what side effects they may  cause-can help you determine which product may be best for you.  If you're considering one of these products, reading labels and talking to your pharmacist and other health care providers are good first steps to take.  You also can check the FDA's website for more information on each product at Drugs@FDA , where you can search for each product by name.  And remember to  weigh each product's benefits and risks, among other considerations.  About Nicotine Replacement Therapy (NRT) Nicotine is the substance primarily responsible for causing addiction to tobacco products. Tobacco users who are addicted to nicotine are used to having nicotine in their bodies.  As you try to quit smoking, you may have symptoms of nicotine withdrawal. When you quit, this withdrawal may cause symptoms like cravings, or urges, to smoke; depression; trouble sleeping; irritability; anxiety; and increased appetite.  Nicotine withdrawal can discourage some smokers from continuing with a quit attempt. But the FDA has approved several smoking cessation products designed to help users gradually withdraw from smoking (that is, "wean" themselves from smoking) by using specific amounts of nicotine that decrease over time. This type of product is called a "nicotine replacement therapy" or NRT. It supplies nicotine in controlled amounts while sparing you from other chemicals found in tobacco products.  NRTs are available over the counter and by prescription. You should generally use them only for a short time to help you manage nicotine cravings and withdrawal. However, the FDA recognizes that some people may need to use these products longer to stay smoke-free. Talk to your health care provider to determine the best course of treatment for you.  Over-the-counter NRTs are approved for sale to people age 28 and older. They are available under various brand names and sometimes as generic products. They include:  - Skin patches (also called "transdermal nicotine patches"). These patches are placed on the skin, similar to how you would apply an adhesive bandage. - Chewing gum (also called "nicotine gum"). This gum must be chewed according to the labeled instructions to be effective. - Lozenges (also called "nicotine lozenges"). You use these products by dissolving them in your mouth. For over-the-counter  products, it's important to follow the instructions on the Drug Facts Label (DFL) and to read the enclosed User's Guide for complete directions and other important information. Ask your health care provider if you have questions.  Currently, prescription nicotine replacement therapy is available only under the brand name Nicotrol, and is available both as a nasal spray and an oral inhaler. The products are FDA-approved only for use by adults.  If you are under age 47 and want to quit smoking, talk to a health care professional about whether you should use nicotine replacement therapies.  Important Advice for People Considering Nicotine Replacement Therapy Women who are pregnant or breastfeeding should talk to their health care providers and use nicotine replacement products only if the health care providers approve.  Also talk to your health care provider before using these products if you have:  diabetes, heart disease, asthma, or stomach ulcers; had a recent heart attack; high blood pressure that is not controlled with medicine; a history of irregular heartbeat; or been prescribed medication to help you quit smoking. If you take prescription medication for depression or asthma, tell your health care provider if you are quitting smoking because he or she may need to change your prescription dose.  Stop using a nicotine replacement product and call your health care professional if you have any of  the following symptoms: nausea; dizziness; weakness; vomiting; fast or irregular heartbeat; mouth problems with the lozenge or gum; or redness or swelling of the skin around the patch that does not go away.  About Prescription Cessation Medicines Without Nicotine  The FDA has approved two smoking cessation products that do not contain nicotine. They are Chantix (varenicline tartrate) and Zyban (buproprion hydrochloride). Both are available in tablet form and by prescription only.  Chantix acts  at sites in the brain affected by nicotine by reducing the rewarding effects of nicotine. The precise way that Zyban helps with smoking cessation is unknown.  As with other prescription products, the FDA has evaluated these medicines and found that the benefits outweigh the risks. For users taking these products, risks include changes in behavior, depressed mood, hostility, aggression, and suicidal thoughts or actions.  The most common side effects of Chantix include nausea; constipation; gas; vomiting; and trouble sleeping or vivid, unusual, or strange dreams. Chantix also may change how you react to alcohol, so talk to your health care provider about your drinking habits (if you drink alcohol) and whether these habits need to change. Chantix is not recommended for people under the age of 54.  The most commonly observed side effects consistently associated with the use of Zyban are dry mouth and insomnia.  Because Zyban contains the same active ingredient as the antidepressant Wellbutrin (bupropion), the FDA encourages people who use Zyban-and those who are considering it-to talk to their health care providers about the risks of treatment with antidepressant medicines. Zyban has not been studied in children under the age of 4 and is not approved for use in children and teenagers.  Note: If your health care provider prescribes Chantix or Zyban, please read the product's patient medication guide in its entirety. These guides offer important information on side effects, risks, warnings, product ingredients, and what you should talk about with your health care provider before taking the products.  Finally, if you ever have any side effects related to any smoking cessation products, or have any other problems related to your treatment, the FDA would like to hear from you. Please consider making a voluntary and confidential report to the FDA's MedWatch program.  Updated: January 19, 2016    Preventive  Care for Adults, Male A healthy lifestyle and preventive care can promote health and wellness. Preventive health guidelines for men include the following key practices:  A routine yearly physical is a good way to check with your health care provider about your health and preventative screening. It is a chance to share any concerns and updates on your health and to receive a thorough exam.  Visit your dentist for a routine exam and preventative care every 6 months. Brush your teeth twice a day and floss once a day. Good oral hygiene prevents tooth decay and gum disease.  The frequency of eye exams is based on your age, health, family medical history, use of contact lenses, and other factors. Follow your health care provider's recommendations for frequency of eye exams.  Eat a healthy diet. Foods such as vegetables, fruits, whole grains, low-fat dairy products, and lean protein foods contain the nutrients you need without too many calories. Decrease your intake of foods high in solid fats, added sugars, and salt. Eat the right amount of calories for you.Get information about a proper diet from your health care provider, if necessary.  Regular physical exercise is one of the most important things you can do for your health.  Most adults should get at least 150 minutes of moderate-intensity exercise (any activity that increases your heart rate and causes you to sweat) each week. In addition, most adults need muscle-strengthening exercises on 2 or more days a week.  Maintain a healthy weight. The body mass index (BMI) is a screening tool to identify possible weight problems. It provides an estimate of body fat based on height and weight. Your health care provider can find your BMI and can help you achieve or maintain a healthy weight.For adults 20 years and older:  A BMI below 18.5 is considered underweight.  A BMI of 18.5 to 24.9 is normal.  A BMI of 25 to 29.9 is considered overweight.  A BMI of  30 and above is considered obese.  Maintain normal blood lipids and cholesterol levels by exercising and minimizing your intake of saturated fat. Eat a balanced diet with plenty of fruit and vegetables. Blood tests for lipids and cholesterol should begin at age 55 and be repeated every 5 years. If your lipid or cholesterol levels are high, you are over 50, or you are at high risk for heart disease, you may need your cholesterol levels checked more frequently.Ongoing high lipid and cholesterol levels should be treated with medicines if diet and exercise are not working.  If you smoke, find out from your health care provider how to quit. If you do not use tobacco, do not start.  Lung cancer screening is recommended for adults aged 11-80 years who are at high risk for developing lung cancer because of a history of smoking. A yearly low-dose CT scan of the lungs is recommended for people who have at least a 30-pack-year history of smoking and are a current smoker or have quit within the past 15 years. A pack year of smoking is smoking an average of 1 pack of cigarettes a day for 1 year (for example: 1 pack a day for 30 years or 2 packs a day for 15 years). Yearly screening should continue until the smoker has stopped smoking for at least 15 years. Yearly screening should be stopped for people who develop a health problem that would prevent them from having lung cancer treatment.  If you choose to drink alcohol, do not have more than 2 drinks per day. One drink is considered to be 12 ounces (355 mL) of beer, 5 ounces (148 mL) of wine, or 1.5 ounces (44 mL) of liquor.  Avoid use of street drugs. Do not share needles with anyone. Ask for help if you need support or instructions about stopping the use of drugs.  High blood pressure causes heart disease and increases the risk of stroke. Your blood pressure should be checked at least every 1-2 years. Ongoing high blood pressure should be treated with medicines,  if weight loss and exercise are not effective.  If you are 84-28 years old, ask your health care provider if you should take aspirin to prevent heart disease.  Diabetes screening is done by taking a blood sample to check your blood glucose level after you have not eaten for a certain period of time (fasting). If you are not overweight and you do not have risk factors for diabetes, you should be screened once every 3 years starting at age 1. If you are overweight or obese and you are 18-88 years of age, you should be screened for diabetes every year as part of your cardiovascular risk assessment.  Colorectal cancer can be detected and often prevented. Most  routine colorectal cancer screening begins at the age of 64 and continues through age 50. However, your health care provider may recommend screening at an earlier age if you have risk factors for colon cancer. On a yearly basis, your health care provider may provide home test kits to check for hidden blood in the stool. Use of a small camera at the end of a tube to directly examine the colon (sigmoidoscopy or colonoscopy) can detect the earliest forms of colorectal cancer. Talk to your health care provider about this at age 67, when routine screening begins. Direct exam of the colon should be repeated every 5-10 years through age 60, unless early forms of precancerous polyps or small growths are found.  People who are at an increased risk for hepatitis B should be screened for this virus. You are considered at high risk for hepatitis B if:  You were born in a country where hepatitis B occurs often. Talk with your health care provider about which countries are considered high risk.  Your parents were born in a high-risk country and you have not received a shot to protect against hepatitis B (hepatitis B vaccine).  You have HIV or AIDS.  You use needles to inject street drugs.  You live with, or have sex with, someone who has hepatitis B.  You  are a man who has sex with other men (MSM).  You get hemodialysis treatment.  You take certain medicines for conditions such as cancer, organ transplantation, and autoimmune conditions.  Hepatitis C blood testing is recommended for all people born from [birth year] through 1965 and any individual with known risks for hepatitis C.  Practice safe sex. Use condoms and avoid high-risk sexual practices to reduce the spread of sexually transmitted infections (STIs). STIs include gonorrhea, chlamydia, syphilis, trichomonas, herpes, HPV, and human immunodeficiency virus (HIV). Herpes, HIV, and HPV are viral illnesses that have no cure. They can result in disability, cancer, and death.  If you are a man who has sex with other men, you should be screened at least once per year for:  HIV.  Urethral, rectal, and pharyngeal infection of gonorrhea, chlamydia, or both.  If you are at risk of being infected with HIV, it is recommended that you take a prescription medicine daily to prevent HIV infection. This is called preexposure prophylaxis (PrEP). You are considered at risk if:  You are a man who has sex with other men (MSM) and have other risk factors.  You are a heterosexual man, are sexually active, and are at increased risk for HIV infection.  You take drugs by injection.  You are sexually active with a partner who has HIV.  Talk with your health care provider about whether you are at high risk of being infected with HIV. If you choose to begin PrEP, you should first be tested for HIV. You should then be tested every 3 months for as long as you are taking PrEP.  A one-time screening for abdominal aortic aneurysm (AAA) and surgical repair of large AAAs by ultrasound are recommended for men ages 73 to 58 years who are current or former smokers.  Healthy men should no longer receive prostate-specific antigen (PSA) blood tests as part of routine cancer screening. Talk with your health care provider about  prostate cancer screening.  Testicular cancer screening is not recommended for adult males who have no symptoms. Screening includes self-exam, a health care provider exam, and other screening tests. Consult with your health care provider about  any symptoms you have or any concerns you have about testicular cancer.  Use sunscreen. Apply sunscreen liberally and repeatedly throughout the day. You should seek shade when your shadow is shorter than you. Protect yourself by wearing long sleeves, pants, a wide-brimmed hat, and sunglasses year round, whenever you are outdoors.  Once a month, do a whole-body skin exam, using a mirror to look at the skin on your back. Tell your health care provider about new moles, moles that have irregular borders, moles that are larger than a pencil eraser, or moles that have changed in shape or color.  Stay current with required vaccines (immunizations).  Influenza vaccine. All adults should be immunized every year.  Tetanus, diphtheria, and acellular pertussis (Td, Tdap) vaccine. An adult who has not previously received Tdap or who does not know his vaccine status should receive 1 dose of Tdap. This initial dose should be followed by tetanus and diphtheria toxoids (Td) booster doses every 10 years. Adults with an unknown or incomplete history of completing a 3-dose immunization series with Td-containing vaccines should begin or complete a primary immunization series including a Tdap dose. Adults should receive a Td booster every 10 years.  Varicella vaccine. An adult without evidence of immunity to varicella should receive 2 doses or a second dose if he has previously received 1 dose.  Human papillomavirus (HPV) vaccine. Males aged 11-21 years who have not received the vaccine previously should receive the 3-dose series. Males aged 22-26 years may be immunized. Immunization is recommended through the age of 80 years for any male who has sex with males and did not get any  or all doses earlier. Immunization is recommended for any person with an immunocompromised condition through the age of 13 years if he did not get any or all doses earlier. During the 3-dose series, the second dose should be obtained 4-8 weeks after the first dose. The third dose should be obtained 24 weeks after the first dose and 16 weeks after the second dose.  Zoster vaccine. One dose is recommended for adults aged 66 years or older unless certain conditions are present.  Measles, mumps, and rubella (MMR) vaccine. Adults born before [birth year] generally are considered immune to measles and mumps. Adults born in [birth year] or later should have 1 or more doses of MMR vaccine unless there is a contraindication to the vaccine or there is laboratory evidence of immunity to each of the three diseases. A routine second dose of MMR vaccine should be obtained at least 28 days after the first dose for students attending postsecondary schools, health care workers, or international travelers. People who received inactivated measles vaccine or an unknown type of measles vaccine during 1963-1967 should receive 2 doses of MMR vaccine. People who received inactivated mumps vaccine or an unknown type of mumps vaccine before 1979 and are at high risk for mumps infection should consider immunization with 2 doses of MMR vaccine. Unvaccinated health care workers born before [birth year] who lack laboratory evidence of measles, mumps, or rubella immunity or laboratory confirmation of disease should consider measles and mumps immunization with 2 doses of MMR vaccine or rubella immunization with 1 dose of MMR vaccine.  Pneumococcal 13-valent conjugate (PCV13) vaccine. When indicated, a person who is uncertain of his immunization history and has no record of immunization should receive the PCV13 vaccine. All adults 2 years of age and older should receive this vaccine. An adult aged 40 years or older who has certain medical conditions and has  not  been previously immunized should receive 1 dose of PCV13 vaccine. This PCV13 should be followed with a dose of pneumococcal polysaccharide (PPSV23) vaccine. Adults who are at high risk for pneumococcal disease should obtain the PPSV23 vaccine at least 8 weeks after the dose of PCV13 vaccine. Adults older than 46 years of age who have normal immune system function should obtain the PPSV23 vaccine dose at least 1 year after the dose of PCV13 vaccine.  Pneumococcal polysaccharide (PPSV23) vaccine. When PCV13 is also indicated, PCV13 should be obtained first. All adults aged 54 years and older should be immunized. An adult younger than age 15 years who has certain medical conditions should be immunized. Any person who resides in a nursing home or long-term care facility should be immunized. An adult smoker should be immunized. People with an immunocompromised condition and certain other conditions should receive both PCV13 and PPSV23 vaccines. People with human immunodeficiency virus (HIV) infection should be immunized as soon as possible after diagnosis. Immunization during chemotherapy or radiation therapy should be avoided. Routine use of PPSV23 vaccine is not recommended for American Indians, [place] Natives, or people younger than 65 years unless there are medical conditions that require PPSV23 vaccine. When indicated, people who have unknown immunization and have no record of immunization should receive PPSV23 vaccine. One-time revaccination 5 years after the first dose of PPSV23 is recommended for people aged 19-64 years who have chronic kidney failure, nephrotic syndrome, asplenia, or immunocompromised conditions. People who received 1-2 doses of PPSV23 before age 37 years should receive another dose of PPSV23 vaccine at age 56 years or later if at least 5 years have passed since the previous dose. Doses of PPSV23 are not needed for people immunized with PPSV23 at or after age 3 years.  Meningococcal  vaccine. Adults with asplenia or persistent complement component deficiencies should receive 2 doses of quadrivalent meningococcal conjugate (MenACWY-D) vaccine. The doses should be obtained at least 2 months apart. Microbiologists working with certain meningococcal bacteria, [place] recruits, people at risk during an outbreak, and people who travel to or live in countries with a high rate of meningitis should be immunized. A first-year college student up through age 8 years who is living in a residence hall should receive a dose if he did not receive a dose on or after his 16th birthday. Adults who have certain high-risk conditions should receive one or more doses of vaccine.  Hepatitis A vaccine. Adults who wish to be protected from this disease, have chronic liver disease, work with hepatitis A-infected animals, work in hepatitis A research labs, or travel to or work in countries with a high rate of hepatitis A should be immunized. Adults who were previously unvaccinated and who anticipate close contact with an international adoptee during the first 60 days after arrival in the [place] from a country with a high rate of hepatitis A should be immunized.  Hepatitis B vaccine. Adults should be immunized if they wish to be protected from this disease, are under age 68 years and have diabetes, have chronic liver disease, have had more than one sex partner in the past 6 months, may be exposed to blood or other infectious body fluids, are household contacts or sex partners of hepatitis B positive people, are clients or workers in certain care facilities, or travel to or work in countries with a high rate of hepatitis B.  Haemophilus influenzae type b (Hib) vaccine. A previously unvaccinated person with asplenia or sickle cell disease  or having a scheduled splenectomy should receive 1 dose of Hib vaccine. Regardless of previous immunization, a recipient of a hematopoietic stem cell transplant should receive  a 3-dose series 6-12 months after his successful transplant. Hib vaccine is not recommended for adults with HIV infection. Preventive Service / Frequency Ages 16 to 42  Blood pressure check.** / Every 3-5 years.  Lipid and cholesterol check.** / Every 5 years beginning at age 16.  Hepatitis C blood test.** / For any individual with known risks for hepatitis C.  Skin self-exam. / Monthly.  Influenza vaccine. / Every year.  Tetanus, diphtheria, and acellular pertussis (Tdap, Td) vaccine.** / Consult your health care provider. 1 dose of Td every 10 years.  Varicella vaccine.** / Consult your health care provider.  HPV vaccine. / 3 doses over 6 months, if 53 or younger.  Measles, mumps, rubella (MMR) vaccine.** / You need at least 1 dose of MMR if you were born in 1957 or later. You may also need a second dose.  Pneumococcal 13-valent conjugate (PCV13) vaccine.** / Consult your health care provider.  Pneumococcal polysaccharide (PPSV23) vaccine.** / 1 to 2 doses if you smoke cigarettes or if you have certain conditions.  Meningococcal vaccine.** / 1 dose if you are age 17 to 54 years and a Market researcher living in a residence hall, or have one of several medical conditions. You may also need additional booster doses.  Hepatitis A vaccine.** / Consult your health care provider.  Hepatitis B vaccine.** / Consult your health care provider.  Haemophilus influenzae type b (Hib) vaccine.** / Consult your health care provider. Ages 81 to 60  Blood pressure check.** / Every year.  Lipid and cholesterol check.** / Every 5 years beginning at age 31.  Lung cancer screening. / Every year if you are aged 51-80 years and have a 30-pack-year history of smoking and currently smoke or have quit within the past 15 years. Yearly screening is stopped once you have quit smoking for at least 15 years or develop a health problem that would prevent you from having lung cancer  treatment.  Fecal occult blood test (FOBT) of stool. / Every year beginning at age 54 and continuing until age 62. You may not have to do this test if you get a colonoscopy every 10 years.  Flexible sigmoidoscopy** or colonoscopy.** / Every 5 years for a flexible sigmoidoscopy or every 10 years for a colonoscopy beginning at age 45 and continuing until age 46.  Hepatitis C blood test.** / For all people born from [birth year] through 1965 and any individual with known risks for hepatitis C.  Skin self-exam. / Monthly.  Influenza vaccine. / Every year.  Tetanus, diphtheria, and acellular pertussis (Tdap/Td) vaccine.** / Consult your health care provider. 1 dose of Td every 10 years.  Varicella vaccine.** / Consult your health care provider.  Zoster vaccine.** / 1 dose for adults aged 35 years or older.  Measles, mumps, rubella (MMR) vaccine.** / You need at least 1 dose of MMR if you were born in 1957 or later. You may also need a second dose.  Pneumococcal 13-valent conjugate (PCV13) vaccine.** / Consult your health care provider.  Pneumococcal polysaccharide (PPSV23) vaccine.** / 1 to 2 doses if you smoke cigarettes or if you have certain conditions.  Meningococcal vaccine.** / Consult your health care provider.  Hepatitis A vaccine.** / Consult your health care provider.  Hepatitis B vaccine.** / Consult your health care provider.  Haemophilus influenzae type b (  Hib) vaccine.** / Consult your health care provider. Ages 63 and over  Blood pressure check.** / Every year.  Lipid and cholesterol check.**/ Every 5 years beginning at age 78.  Lung cancer screening. / Every year if you are aged 68-80 years and have a 30-pack-year history of smoking and currently smoke or have quit within the past 15 years. Yearly screening is stopped once you have quit smoking for at least 15 years or develop a health problem that would prevent you from having lung cancer treatment.  Fecal occult blood test  (FOBT) of stool. / Every year beginning at age 92 and continuing until age 40. You may not have to do this test if you get a colonoscopy every 10 years.  Flexible sigmoidoscopy** or colonoscopy.** / Every 5 years for a flexible sigmoidoscopy or every 10 years for a colonoscopy beginning at age 80 and continuing until age 2.  Hepatitis C blood test.** / For all people born from [birth year] through 1965 and any individual with known risks for hepatitis C.  Abdominal aortic aneurysm (AAA) screening.** / A one-time screening for ages 69 to 14 years who are current or former smokers.  Skin self-exam. / Monthly.  Influenza vaccine. / Every year.  Tetanus, diphtheria, and acellular pertussis (Tdap/Td) vaccine.** / 1 dose of Td every 10 years.  Varicella vaccine.** / Consult your health care provider.  Zoster vaccine.** / 1 dose for adults aged 13 years or older.  Pneumococcal 13-valent conjugate (PCV13) vaccine.** / 1 dose for all adults aged 38 years and older.  Pneumococcal polysaccharide (PPSV23) vaccine.** / 1 dose for all adults aged 78 years and older.  Meningococcal vaccine.** / Consult your health care provider.  Hepatitis A vaccine.** / Consult your health care provider.  Hepatitis B vaccine.** / Consult your health care provider.  Haemophilus influenzae type b (Hib) vaccine.** / Consult your health care provider. **Family history and personal history of risk and conditions may change your health care provider's recommendations.   This information is not intended to replace advice given to you by your health care provider. Make sure you discuss any questions you have with your health care provider.   Document Released: 03/23/2001 Document Revised: 02/15/2014 Document Reviewed: 06/22/2010 Elsevier Interactive Patient Education [employer].

## 2016-08-23 NOTE — Progress Notes (Signed)
Male physical  Impression and Recommendations:    1. Encounter for wellness examination   2. Screening for multiple conditions   3. Health education/counseling   4. Tobacco use disorder- approximately 28-pack-year history   5. Encounter for counseling for tobacco use disorder   6. Male pattern baldness     1) Labs obtained today- pt is fasting  2) Patient would like refill his finasteride for his male pattern baldness, he will call us with exact dose and sig. 3) Wellbutrin started for smoking cessation after we discussed various medication options.  We discussed risks and benefits of medication.  We discussed smoking cessation strategies.  Extensive counseling done.   Orders Placed This Encounter  Procedures  . CBC with Differential/Platelet  . Comprehensive metabolic panel    Order Specific Question:   Has the patient fasted?    Answer:   Yes  . Hemoglobin A1c  . HIV antibody  . Lipid panel    Order Specific Question:   Has the patient fasted?    Answer:   Yes  . TSH  . T4, free  . VITAMIN D 25 Hydroxy (Vit-D Deficiency, Fractures)    Patient's Medications  New Prescriptions   BUPROPION (WELLBUTRIN XL) 150 MG 24 HR TABLET    Take 1 tablet (150 mg total) by mouth every morning.  Previous Medications   BACLOFEN (LIORESAL) 10 MG TABLET    Take 1 tablet (10 mg total) by mouth 2 (two) times daily.   BIOTIN 5000 PO    Take by mouth.   CETIRIZINE (ZYRTEC) 1 MG/ML SYRUP    Take 10 mg by mouth daily. Reported on 03/06/2015   FEXOFENADINE (ALLEGRA) 180 MG TABLET    Take 1 tablet (180 mg total) by mouth daily.   FINASTERIDE (PROSCAR) 5 MG TABLET    Take 1.25 mg by mouth daily.   MOMETASONE (NASONEX) 50 MCG/ACT NASAL SPRAY    Place 2 sprays into the nose daily.   MONTELUKAST (SINGULAIR) 10 MG TABLET    Take 10 mg by mouth at bedtime.   MULTIPLE VITAMIN (ONE-A-DAY ESSENTIAL PO)    Take by mouth.   PROBIOTIC PRODUCT (FORTIFY DAILY PROBIOTIC PO)    Take by mouth.   SUMATRIPTAN  (IMITREX) 100 MG TABLET      Modified Medications   No medications on file  Discontinued Medications   FINASTERIDE PO    Take by mouth.     Please see AVS handed out to patient at the end of our visit for further patient instructions/ counseling done pertaining to today's office visit.  1) Anticipatory Guidance: Discussed importance of wearing a seatbelt while driving, not texting while driving;   sunscreen when outside along with skin surveillance; eating a balanced and modest diet; physical activity at least 25 minutes per day or 150 min/ week moderate to intense activity.  2) Immunizations / Screenings / Labs:  All immunizations are up-to-date per recommendations or will be updated today. Patient is due for dental and vision screens which pt will schedule independently. Will obtain CBC, CMP, HgA1c, Lipid panel, TSH and vit D when fasting, if not already done recently.   3) Weight:  BMI meaning discussed with patient.  Discussed goal of losing 5-10% of current body weight which would improve overall feelings of well being and improve objective health data. Improve nutrient density of diet through increasing intake of fruits and vegetables and decreasing saturated fats, white flour products and refined sugars.  Gross side effects, risk and benefits, and alternatives of medications discussed with patient.  Patient is aware that all medications have potential side effects and we are unable to predict every side effect or drug-drug interaction that may occur.  Expresses verbal understanding and consents to current therapy plan and treatment regimen.  Follow-up preventative CPE in 1 year. Follow-up office visit pending lab work.  F/up sooner for chronic care management and/or prn    Subjective:    CC: CPE  HPI: Paul Schroeder is a 46 y.o. male who presents to [place] [facility] today for a yearly health maintenance exam.    Health Maintenance Summary  Reviewed and updated, unless pt declines services.  --> yrly prostate exams with Urology.   Aspirin: administering 81 mg daily Colonoscopy:     None yet.  No inc risk or fam hx.  Tdap: Up to date: needs TD  Tobacco History Reviewed:   1ppd-->   Patches don't work well for him.  Alcohol:    No concerns, no excessive use Exercise Habits:   30 min wlaking daily; switched to stand-up desk STD concerns:   none Drug Use:   None Testicular/penile concerns:   none Gets every 6 months skin exams from his dermatologist due to his history of melanoma.  Health Maintenance  Topic Date Due  . TETANUS/TDAP  08/12/2017 (Originally 05/10/1989)  . HIV Screening  08/12/2017 (Originally 05/10/1985)  . INFLUENZA VACCINE  09/08/2016      Wt Readings from Last 3 Encounters:  08/23/16 177 lb (80.3 kg)  08/12/16 173 lb 6.4 oz (78.7 kg)  10/30/15 169 lb 8 oz (76.9 kg)   BP Readings from Last 3 Encounters:  08/23/16 (!) 141/93  08/12/16 (!) 151/99  10/30/15 (!) 155/100   Pulse Readings from Last 3 Encounters:  08/23/16 80  08/12/16 82  10/30/15 87    Patient Active Problem List   Diagnosis Date Noted  . Elevated blood pressure reading- white coat 08/12/2016    Priority: High  . Tobacco use disorder- approximately 28-pack-year history 08/12/2016    Priority: High  . History of panic attacks 08/12/2016    Priority: High  . Adjustment disorder with mixed anxiety and depressed mood- since teenager 08/12/2016    Priority: High  . 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 )  08/12/2016    Priority: Medium  . Pelvic floor dysfunction- treated by urology 04/22/2015    Priority: Medium  . Environmental and seasonal allergies 08/12/2016    Priority: Low  . Seasonal allergic rhinitis 08/12/2016    Priority: Low  . H/O non anemic vitamin B12 deficiency 08/12/2016    Priority: Low  . H/O iron deficiency anemia 08/12/2016    Priority: Low  . Encounter for counseling for tobacco use disorder  08/12/2016    Priority: Low  . Family history of mood disorder- MOM 08/12/2016    Priority: Low  . 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 Melanoma of skin ([place]) 08/12/2016  . Dysuria 10/07/2015  . Right groin pain 03/05/2015  . Hematospermia 02/22/2015  . Epididymitis, right 02/22/2015    Past Medical History:  Diagnosis Date  . Allergy   . Anxiety   . Bronchitis   . Cancer [facility]) 2013   Malignant mole on his back removed   . Hematospermia   . Hemorrhoids   . Migraines    family Hx, triggered by stress  . Seasonal allergies  Past Surgical History:  Procedure Laterality Date  . skin lesions removed      Family History  Problem Relation Age of Onset  . Hypertension Mother   . Hyperlipidemia Mother   . Stroke Mother   . Depression Mother   . Kidney disease Brother   . Kidney disease Maternal Grandfather   . Heart disease Maternal Grandfather   . Prostate cancer Neg Hx     History  Drug Use  . Frequency: 2.0 times per week    Comment: marijuana  ,  History  Alcohol Use  . 2.4 oz/week  . 4 Shots of liquor per week  ,  History  Smoking Status  . Current Every Day Smoker  . Packs/day: 1.00  . Years: 3.00  . Types: Cigarettes  Smokeless Tobacco  . Never Used  ,  History  Sexual Activity  . Sexual activity: Not on file    Patient's Medications  New Prescriptions   BUPROPION (WELLBUTRIN XL) 150 MG 24 HR TABLET    Take 1 tablet (150 mg total) by mouth every morning.  Previous Medications   BACLOFEN (LIORESAL) 10 MG TABLET    Take 1 tablet (10 mg total) by mouth 2 (two) times daily.   BIOTIN 5000 PO    Take by mouth.   CETIRIZINE (ZYRTEC) 1 MG/ML SYRUP    Take 10 mg by mouth daily. Reported on 03/06/2015   FEXOFENADINE (ALLEGRA) 180 MG TABLET    Take 1 tablet (180 mg total) by mouth daily.   FINASTERIDE (PROSCAR) 5 MG TABLET    Take 1.25 mg by mouth daily.   MOMETASONE (NASONEX) 50 MCG/ACT NASAL SPRAY    Place 2 sprays into the nose daily.   MONTELUKAST  (SINGULAIR) 10 MG TABLET    Take 10 mg by mouth at bedtime.   MULTIPLE VITAMIN (ONE-A-DAY ESSENTIAL PO)    Take by mouth.   PROBIOTIC PRODUCT (FORTIFY DAILY PROBIOTIC PO)    Take by mouth.   SUMATRIPTAN (IMITREX) 100 MG TABLET      Modified Medications   No medications on file  Discontinued Medications   FINASTERIDE PO    Take by mouth.    Patient has no known allergies.  Review of Systems: General:   Denies fever, chills, unexplained weight loss.  Optho/Auditory:   Denies visual changes, blurred vision/LOV Respiratory:   Denies SOB, DOE more than baseline levels.  Cardiovascular:   Denies chest pain, palpitations, new onset peripheral edema  Gastrointestinal:   Denies nausea, vomiting, diarrhea.  Genitourinary: Denies dysuria, freq/ urgency, flank pain or discharge from genitals.  Endocrine:     Denies hot or cold intolerance, polyuria, polydipsia. Musculoskeletal:   Denies unexplained myalgias, joint swelling, unexplained arthralgias, gait problems.  Skin:  Denies rash, suspicious lesions Neurological:     Denies dizziness, unexplained weakness, numbness  Psychiatric/Behavioral:   Denies mood changes, suicidal or homicidal ideations, hallucinations    Objective:     Blood pressure (!) 141/93, pulse 80, height 5\' 10"  (1.778 m), weight 177 lb (80.3 kg). Body mass index is 25.4 kg/m. General Appearance:    Alert, cooperative, no distress, appears stated age  Head:    Normocephalic, without obvious abnormality, atraumatic  Eyes:    PERRL, conjunctiva/corneas clear, EOM's intact, fundi    benign, both eyes  Ears:    Normal TM's and external ear canals, both ears  Nose:   Nares normal, septum midline, mucosa normal, no drainage    or sinus tenderness  Throat:   Lips w/o lesion, mucosa moist, and tongue normal; teeth and   gums normal  Neck:   Supple, symmetrical, trachea midline, no adenopathy;    thyroid:  no enlargement/tenderness/nodules; no carotid   bruit or JVD  Back:      Symmetric, no curvature, ROM normal, no CVA tenderness  Lungs:     Clear to auscultation bilaterally, respirations unlabored, no Wh/ R/ R  Chest Wall:    No tenderness or gross deformity; normal excursion   Heart:    Regular rate and rhythm, S1 and S2 normal, no murmur, rub   or gallop  Abdomen:     Soft, non-tender, bowel sounds active all four quadrants, NO G/R/R, no masses, no organomegaly  Genitalia:   Defers has urologist   Rectal:   Defers has urologist   Extremities:   Extremities normal, atraumatic, no cyanosis or gross edema  Pulses:   2+ and symmetric all extremities  Skin:   Warm, dry, Skin color, texture, turgor normal, no obvious rashes or lesions- sees dermatology for yearly skin screenings   M-Sk:   Ambulates * 4 w/o difficulty, no gross deformities, tone WNL  Neurologic:   CNII-XII intact, normal strength, sensation and reflexes    Throughout Psych:  No HI/SI, judgement and insight good, Euthymic mood. Full Affect.

## 2016-08-23 NOTE — Telephone Encounter (Signed)
Pt called states Dr.Opalski requested he call back w/ Proscar information-- Pt states he takes as prescribed 1 (1/4) 5 MG tablet daily of the Proscar--advised will forward information to provider. --glh

## 2016-08-23 NOTE — Telephone Encounter (Signed)
Called patient verified that he takes 1/4 tablet daily - pended medication in today's chart for Dr Raliegh Scarlet to sign off and send in to pharmacy. MPulliam, CMA/RT(R)

## 2016-08-25 LAB — COMPREHENSIVE METABOLIC PANEL
A/G RATIO: 2.5 — AB (ref 1.2–2.2)
ALK PHOS: 67 IU/L (ref 39–117)
ALT: 23 IU/L (ref 0–44)
AST: 16 IU/L (ref 0–40)
Albumin: 4.7 g/dL (ref 3.5–5.5)
BILIRUBIN TOTAL: 0.3 mg/dL (ref 0.0–1.2)
BUN / CREAT RATIO: 13 (ref 9–20)
BUN: 10 mg/dL (ref 6–24)
CHLORIDE: 101 mmol/L (ref 96–106)
CO2: 26 mmol/L (ref 20–29)
Calcium: 9.7 mg/dL (ref 8.7–10.2)
Creatinine, Ser: 0.75 mg/dL — ABNORMAL LOW (ref 0.76–1.27)
GFR calc non Af Amer: 110 mL/min/{1.73_m2} (ref >59)
GFR, EST AFRICAN AMERICAN: 127 mL/min/{1.73_m2} (ref >59)
GLOBULIN, TOTAL: 1.9 g/dL (ref 1.5–4.5)
GLUCOSE: 88 mg/dL (ref 65–99)
POTASSIUM: 3.8 mmol/L (ref 3.5–5.2)
SODIUM: 144 mmol/L (ref 134–144)
Total Protein: 6.6 g/dL (ref 6.0–8.5)

## 2016-08-25 LAB — CBC WITH DIFFERENTIAL/PLATELET
BASOS: 1 %
Basophils Absolute: 0.1 10*3/uL (ref 0.0–0.2)
EOS (ABSOLUTE): 0.3 10*3/uL (ref 0.0–0.4)
Eos: 5 %
HEMOGLOBIN: 14.1 g/dL (ref 13.0–17.7)
Hematocrit: 40.4 % (ref 37.5–51.0)
Immature Grans (Abs): 0 10*3/uL (ref 0.0–0.1)
Immature Granulocytes: 0 %
LYMPHS ABS: 1.3 10*3/uL (ref 0.7–3.1)
Lymphs: 21 %
MCH: 31.1 pg (ref 26.6–33.0)
MCHC: 34.9 g/dL (ref 31.5–35.7)
MCV: 89 fL (ref 79–97)
MONOCYTES: 6 %
Monocytes Absolute: 0.4 10*3/uL (ref 0.1–0.9)
NEUTROS ABS: 4 10*3/uL (ref 1.4–7.0)
Neutrophils: 67 %
Platelets: 249 10*3/uL (ref 150–379)
RBC: 4.54 x10E6/uL (ref 4.14–5.80)
RDW: 13.2 % (ref 12.3–15.4)
WBC: 6 10*3/uL (ref 3.4–10.8)

## 2016-08-25 LAB — T4, FREE: Free T4: 1.37 ng/dL (ref 0.82–1.77)

## 2016-08-25 LAB — LIPID PANEL
CHOL/HDL RATIO: 2.6 ratio (ref 0.0–5.0)
Cholesterol, Total: 150 mg/dL (ref 100–199)
HDL: 58 mg/dL (ref >39)
LDL CALC: 70 mg/dL (ref 0–99)
Triglycerides: 111 mg/dL (ref 0–149)
VLDL CHOLESTEROL CAL: 22 mg/dL (ref 5–40)

## 2016-08-25 LAB — VITAMIN D 25 HYDROXY (VIT D DEFICIENCY, FRACTURES): VIT D 25 HYDROXY: 34 ng/mL (ref 30.0–100.0)

## 2016-08-25 LAB — HIV ANTIBODY (ROUTINE TESTING W REFLEX): HIV Screen 4th Generation wRfx: NONREACTIVE

## 2016-08-25 LAB — HEMOGLOBIN A1C
Est. average glucose Bld gHb Est-mCnc: 91 mg/dL
HEMOGLOBIN A1C: 4.8 % (ref 4.8–5.6)

## 2016-08-25 LAB — TSH: TSH: 1.39 u[IU]/mL (ref 0.450–4.500)

## 2016-08-30 ENCOUNTER — Other Ambulatory Visit (INDEPENDENT_AMBULATORY_CARE_PROVIDER_SITE_OTHER): Payer: Managed Care, Other (non HMO)

## 2016-08-30 DIAGNOSIS — Z1211 Encounter for screening for malignant neoplasm of colon: Secondary | ICD-10-CM

## 2016-08-30 LAB — IFOBT (OCCULT BLOOD)
IFOBT: NEGATIVE
IFOBT: NEGATIVE
IMMUNOLOGICAL FECAL OCCULT BLOOD TEST: NEGATIVE

## 2016-08-31 ENCOUNTER — Other Ambulatory Visit: Payer: Managed Care, Other (non HMO)

## 2016-09-06 ENCOUNTER — Ambulatory Visit: Payer: Managed Care, Other (non HMO) | Admitting: Family Medicine

## 2016-09-10 ENCOUNTER — Ambulatory Visit (INDEPENDENT_AMBULATORY_CARE_PROVIDER_SITE_OTHER): Payer: Managed Care, Other (non HMO) | Admitting: Family Medicine

## 2016-09-10 ENCOUNTER — Encounter: Payer: Self-pay | Admitting: Family Medicine

## 2016-09-10 VITALS — BP 154/99 | HR 60 | Ht 70.0 in | Wt 179.0 lb

## 2016-09-10 DIAGNOSIS — M6289 Other specified disorders of muscle: Secondary | ICD-10-CM

## 2016-09-10 DIAGNOSIS — Z716 Tobacco abuse counseling: Secondary | ICD-10-CM

## 2016-09-10 DIAGNOSIS — L649 Androgenic alopecia, unspecified: Secondary | ICD-10-CM

## 2016-09-10 DIAGNOSIS — G43809 Other migraine, not intractable, without status migrainosus: Secondary | ICD-10-CM

## 2016-09-10 DIAGNOSIS — K219 Gastro-esophageal reflux disease without esophagitis: Secondary | ICD-10-CM | POA: Insufficient documentation

## 2016-09-10 DIAGNOSIS — J302 Other seasonal allergic rhinitis: Secondary | ICD-10-CM

## 2016-09-10 DIAGNOSIS — R030 Elevated blood-pressure reading, without diagnosis of hypertension: Secondary | ICD-10-CM

## 2016-09-10 DIAGNOSIS — I10 Essential (primary) hypertension: Secondary | ICD-10-CM | POA: Insufficient documentation

## 2016-09-10 DIAGNOSIS — F419 Anxiety disorder, unspecified: Secondary | ICD-10-CM | POA: Insufficient documentation

## 2016-09-10 DIAGNOSIS — F17200 Nicotine dependence, unspecified, uncomplicated: Secondary | ICD-10-CM

## 2016-09-10 MED ORDER — BACLOFEN 5 MG PO TABS: 5.0000 mg | ORAL_TABLET | Freq: Two times a day (BID) | ORAL | 1 refills | Status: DC

## 2016-09-10 MED ORDER — MONTELUKAST SODIUM 10 MG PO TABS: 10.0000 mg | ORAL_TABLET | Freq: Every day | ORAL | 1 refills | Status: DC

## 2016-09-10 MED ORDER — FINASTERIDE 1 MG PO TABS: 2.0000 mg | ORAL_TABLET | Freq: Every day | ORAL | 1 refills | Status: DC

## 2016-09-10 MED ORDER — RANITIDINE HCL 150 MG PO TABS: 150.0000 mg | ORAL_TABLET | Freq: Two times a day (BID) | ORAL | Status: DC

## 2016-09-10 MED ORDER — BUPROPION HCL ER (XL) 150 MG PO TB24: 150.0000 mg | ORAL_TABLET | ORAL | 1 refills | Status: DC

## 2016-09-10 MED ORDER — SUMATRIPTAN SUCCINATE 100 MG PO TABS: 100.0000 mg | ORAL_TABLET | Freq: Every day | ORAL | 0 refills | Status: DC | PRN

## 2016-09-10 MED ORDER — LOSARTAN POTASSIUM 50 MG PO TABS: 50.0000 mg | ORAL_TABLET | Freq: Every day | ORAL | 0 refills | Status: DC

## 2016-09-10 NOTE — Patient Instructions (Addendum)
Should be on wellbutrin for 6-8 wks then have quit date.   October 1st---> quit date.  Become emotionally invested in it and also try to create new habits as you're doing now  Check BP--> goal is 130/80 or less.  Keep log and bring in next ov in 6wks   Nine ways to increase your "good" HDL cholesterol  High-density lipoprotein (HDL) is often referred to as the "good" cholesterol. Having high HDL levels helps carry cholesterol from your arteries to your liver, where it can be used or excreted.  Having high levels of HDL also has antioxidant and anti-inflammatory effects, and is linked to a reduced risk of heart disease (1, 2).  Most health experts recommend minimum blood levels of 40 mg/dl in men and 50 mg/dl in women.  While genetics definitely play a role, there are several other factors that affect HDL levels.  Here are nine healthy ways to raise your "good" HDL cholesterol.  1. Consume olive oil  two pieces of salmon on a plate olive oil being poured into a small dish Extra virgin olive oil may be more healthful than processed olive oils. Olive oil is one of the healthiest fats around.  A large analysis of 42 studies with more than 800,000 participants found that olive oil was the only source of monounsaturated fat that seemed to reduce heart disease risk (3).  Research has shown that one of olive oil's heart-healthy effects is an increase in HDL cholesterol. This effect is thought to be caused by antioxidants it contains called polyphenols (4, 5, 6, 7).  Extra virgin olive oil has more polyphenols than more processed olive oils, although the amount can still vary among different types and brands.  One study gave 200 healthy young men about 2 tablespoons (25 ml) of different olive oils per day for three weeks.  The researchers found that participants' HDL levels increased significantly more after they consumed the olive oil with the highest polyphenol content (6).  In another  study, when 87 older adults consumed about 4 tablespoons (50 ml) of high-polyphenol extra virgin olive oil every day for six weeks, their HDL cholesterol increased by 6.5 mg/dl, on average (7).  In addition to raising HDL levels, olive oil has been found to boost HDL's anti-inflammatory and antioxidant function in studies of older people and individuals with high cholesterol levels ( 7, 8, 9).  Whenever possible, select high-quality, certified extra virgin olive oils, which tend to be highest in polyphenols.  Bottom line: Extra virgin olive oil with a high polyphenol content has been shown to increase HDL levels in healthy people, the elderly and individuals with high cholesterol.  2. Follow a low-carb or ketogenic diet  Low-carb and ketogenic diets provide a number of health benefits, including weight loss and reduced blood sugar levels.  They have also been shown to increase HDL cholesterol in people who tend to have lower levels.  This includes those who are obese, insulin resistant or diabetic (10, 11, 12, 13, 14, 15, 16, 17).  In one study, people with type 2 diabetes were split into two groups.  One followed a diet consuming less than 50 grams of carbs per day. The other followed a high-carb diet.  Although both groups lost weight, the low-carb group's HDL cholesterol increased almost twice as much as the high-carb group's did (14).  In another study, obese people who followed a low-carb diet experienced an increase in HDL cholesterol of 5 mg/dl overall.  Meanwhile, in  the same study, the participants who ate a low-fat, high-carb diet showed a decrease in HDL cholesterol (15).  This response may partially be due to the higher levels of fat people typically consume on low-carb diets.  One study in overweight women found that diets high in meat and cheese increased HDL levels by 5-8%, compared to a higher-carb diet (18).  What's more, in addition to raising HDL cholesterol,  very-low-carb diets have been shown to decrease triglycerides and improve several other risk factors for heart disease (13, 14, 16, 17).  Bottom line: Low-carb and ketogenic diets typically increase HDL cholesterol levels in people with diabetes, metabolic syndrome and obesity.  3. Exercise regularly  Being physically active is important for heart health.  Studies have shown that many different types of exercise are effective at raising HDL cholesterol, including strength training, high-intensity exercise and aerobic exercise (19, 20, 21, 22, 23, 24).  However, the biggest increases in HDL are typically seen with high-intensity exercise.  One small study followed women who were living with polycystic ovary syndrome (PCOS), which is linked to a higher risk of insulin resistance. The study required them to perform high-intensity exercise three times a week.  The exercise led to an increase in HDL cholesterol of 8 mg/dL after 10 weeks. The women also showed improvements in other health markers, including decreased insulin resistance and improved arterial function (23).  In a 12-week study, overweight men who performed high-intensity exercise experienced a 10% increase in HDL cholesterol.  In contrast, the low-intensity exercise group showed only a 2% increase and the endurance training group experienced no change (24).  However, even lower-intensity exercise seems to increase HDL's anti-inflammatory and antioxidant capacities, whether or not HDL levels change (20, 21, 25).  Overall, high-intensity exercise such as high-intensity interval training (HIIT) and high-intensity circuit training (HICT) may boost HDL cholesterol levels the most.  Bottom line: Exercising several times per week can help raise HDL cholesterol and enhance its anti-inflammatory and antioxidant effects. High-intensity forms of exercise may be especially effective.  4. Add coconut oil to your diet  Studies have shown that  coconut oil may reduce appetite, increase metabolic rate and help protect brain health, among other benefits.  Some people may be concerned about coconut oil's effects on heart health due to its high saturated fat content.  However, it appears that coconut oil is actually quite heart healthy.  Coconut oil tends to raise HDL cholesterol more than many other types of fat.  In addition, it may improve the ratio of low-density-lipoprotein (LDL) cholesterol, the "bad" cholesterol, to HDL cholesterol. Improving this ratio reduces heart disease risk (26, 27, 28, 29).  One study examined the health effects of coconut oil on 1 women with excess belly fat. The researchers found that participants who took coconut oil daily experienced increased HDL cholesterol and a lower LDL-to-HDL ratio.  In contrast, the group who took soybean oil daily had a decrease in HDL cholesterol and an increase in the LDL-to-HDL ratio (29).  Most studies have found these health benefits occur at a dosage of about 2 tablespoons (30 ml) of coconut oil per day. It's best to incorporate this into cooking rather than eating spoonfuls of coconut oil on their own.  Bottom line: Consuming 2 tablespoons (30 ml) of coconut oil per day may help increase HDL cholesterol levels.  5. Stop smoking  cigarette butt Quitting smoking can reduce the risk of heart disease and lung cancer. Smoking increases the risk of many  health problems, including heart disease and lung cancer (30).  One of its negative effects is a suppression of HDL cholesterol.  Some studies have found that quitting smoking can increase HDL levels. Indeed, one study found no significant differences in HDL levels between former smokers and people who had never smoked (31, 32, 33, 34, 35).  In a one-year study of more than 1,500 people, those who quit smoking had twice the increase in HDL as those who resumed smoking within the year. The number of large HDL particles also  increased, which further reduced heart disease risk (32).  One study followed smokers who switched from traditional cigarettes to electronic cigarettes for one year. They found that the switch was associated with an increase in HDL cholesterol of 5 mg/dl, on average (33).  When it comes to the effect of nicotine replacement patches on HDL levels, research results have been mixed.  One study found that nicotine replacement therapy led to higher HDL cholesterol. However, other research suggests that people who use nicotine patches likely won't see increases in HDL levels until after replacement therapy is completed (34, 36).  Even in studies where HDL cholesterol levels didn't increase after people quit smoking, HDL function improved, resulting in less inflammation and other beneficial effects on heart health (37).  Bottom line: Quitting smoking can increase HDL levels, improve HDL function and help protect heart health.  6. Lose weight  When overweight and obese people lose weight, their HDL cholesterol levels usually increase.  What's more, this benefit seems to occur whether weight loss is achieved by calorie counting, carb restriction, intermittent fasting, weight loss surgery or a combination of diet and exercise (16, 38, 39, 40, 41, 42).  One study examined HDL levels in more than 3,000 overweight and obese [place] adults who followed a lifestyle modification program for one year.  The researchers found that losing at least 6.6 lbs (3 kg) led to an increase in HDL cholesterol of 4 mg/dl, on average (41).  In another study, when obese people with type 2 diabetes consumed calorie-restricted diets that provided 20-30% of calories from protein, they experienced significant increases in HDL cholesterol levels (42).  The key to achieving and maintaining healthy HDL cholesterol levels is choosing the type of diet that makes it easiest for you to lose weight and keep it off.  Bottom Line:  Several methods of weight loss have been shown to increase HDL cholesterol levels in people who are overweight or obese.  7. Choose purple produce  Consuming purple-colored fruits and vegetables is a delicious way to potentially increase HDL cholesterol.  Purple produce contains antioxidants known as anthocyanins.  Studies using anthocyanin extracts have shown that they help fight inflammation, protect your cells from damaging free radicals and may also raise HDL cholesterol levels (43, 44, 45, 46).  In a 24-week study of 7 people with diabetes, those who took an anthocyanin supplement twice a day experienced a 19% increase in HDL cholesterol, on average, along with other improvements in heart health markers (45).  In another study, when people with cholesterol issues took anthocyanin extract for 12 weeks, their HDL cholesterol levels increased by 13.7% (46).  Although these studies used extracts instead of foods, there are several fruits and vegetables that are very high in anthocyanins. These include eggplant, purple corn, red cabbage, blueberries, blackberries and black raspberries.  Bottom line: Consuming fruits and vegetables rich in anthocyanins may help increase HDL cholesterol levels.  8. Eat fatty fish often  The omega-3 fats in fatty fish provide major benefits to heart health, including a reduction in inflammation and better functioning of the cells that line your arteries (47, 48).  There's some research showing that eating fatty fish or taking fish oil may also help raise low levels of HDL cholesterol (49, 50, 51, 52, 53).  In a study of 33 heart disease patients, participants that consumed fatty fish four times per week experienced an increase in HDL cholesterol levels. The particle size of their HDL also increased (52).  In another study, overweight men who consumed herring five days a week for six weeks had a 5% increase in HDL cholesterol, compared with their levels after  eating lean pork and chicken five days a week (53).  However, there are a few studies that found no increase in HDL cholesterol in response to increased fish or omega-3 supplement intake (54, 55).  In addition to herring, other types of fatty fish that may help raise HDL cholesterol include salmon, sardines, mackerel and anchovies.  Bottom line: Eating fatty fish several times per week may help increase HDL cholesterol levels and provide other benefits to heart health.  9. Avoid artificial trans fats  Artificial trans fats have many negative health effects due to their inflammatory properties (56, 57).  There are two types of trans fats. One kind occurs naturally in animal products, including full-fat dairy.  In contrast, the artificial trans fats found in margarines and processed foods are created by adding hydrogen to unsaturated vegetable and seed oils. These fats are also known as industrial trans fats or partially hydrogenated fats.  Research has shown that, in addition to increasing inflammation and contributing to several health problems, these artificial trans fats may lower HDL cholesterol levels.  In one study, researchers compared how people's HDL levels responded when they consumed different margarines.  The study found that participants' HDL cholesterol levels were 10% lower after consuming margarine containing partially hydrogenated soybean oil, compared to their levels after consuming palm oil (58).  Another controlled study followed 40 adults who had diets high in different types of trans fats.  They found that HDL cholesterol levels in women were significantly lower after they consumed the diet high in industrial trans fats, compared to the diet containing naturally occurring trans fats (59).  To protect heart health and keep HDL cholesterol in the healthy range, it's best to avoid artificial trans fats altogether.  Bottom line: Artificial trans fats have been shown to  lower HDL levels and increase inflammation, compared to other fats.  Take home message  Although your HDL cholesterol levels are partly determined by your genetics, there are many things you can do to naturally increase your own levels.  Fortunately, the practices that raise HDL cholesterol often provide other health benefits as well.  Check out the DASH diet = 1.5 Gram Low Sodium Diet   A 1.5 gram sodium diet restricts the amount of sodium in the diet to no more than 1.5 g or 1500 mg daily.  The American Heart Association recommends Americans over the age of 38 to consume no more than 1500 mg of sodium each day to reduce the risk of developing high blood pressure.  Research also shows that limiting sodium may reduce heart attack and stroke risk.  Many foods contain sodium for flavor and sometimes as a preservative.  When the amount of sodium in a diet needs to be low, it is important to know what to look for when choosing  foods and drinks.  The following includes some information and guidelines to help make it easier for you to adapt to a low sodium diet.    QUICK TIPS  Do not add salt to food.  Avoid convenience items and fast food.  Choose unsalted snack foods.  Buy lower sodium products, often labeled as "lower sodium" or "no salt added."  Check food labels to learn how much sodium is in 1 serving.  When eating at a restaurant, ask that your food be prepared with less salt or none, if possible.    READING FOOD LABELS FOR SODIUM INFORMATION  The nutrition facts label is a good place to find how much sodium is in foods. Look for products with no more than 400 mg of sodium per serving.  Remember that 1.5 g = 1500 mg.  The food label may also list foods as:  Sodium-free: Less than 5 mg in a serving.  Very low sodium: 35 mg or less in a serving.  Low-sodium: 140 mg or less in a serving.  Light in sodium: 50% less sodium in a serving. For example, if a food that usually has 300 mg of  sodium is changed to become light in sodium, it will have 150 mg of sodium.  Reduced sodium: 25% less sodium in a serving. For example, if a food that usually has 400 mg of sodium is changed to reduced sodium, it will have 300 mg of sodium.    CHOOSING FOODS  Grains  Avoid: Salted crackers and snack items. Some cereals, including instant hot cereals. Bread stuffing and biscuit mixes. Seasoned rice or pasta mixes.  Choose: Unsalted snack items. Low-sodium cereals, oats, puffed wheat and rice, shredded wheat. English muffins and bread. Pasta.  Meats  Avoid: Salted, canned, smoked, spiced, pickled meats, including fish and poultry. Bacon, ham, sausage, cold cuts, hot dogs, anchovies.  Choose: Low-sodium canned tuna and salmon. Fresh or frozen meat, poultry, and fish.  Dairy  Avoid: Processed cheese and spreads. Cottage cheese. Buttermilk and condensed milk. Regular cheese.  Choose: Milk. Low-sodium cottage cheese. Yogurt. Sour cream. Low-sodium cheese.  Fruits and Vegetables  Avoid: Regular canned vegetables. Regular canned tomato sauce and paste. Frozen vegetables in sauces. Olives. [name]. Relishes. Sauerkraut.  Choose: Low-sodium canned vegetables. Low-sodium tomato sauce and paste. Frozen or fresh vegetables. Fresh and frozen fruit.  Condiments  Avoid: Canned and packaged gravies. Worcestershire sauce. Tartar sauce. Barbecue sauce. Soy sauce. Steak sauce. Ketchup. Onion, garlic, and table salt. Meat flavorings and tenderizers.  Choose: Fresh and dried herbs and spices. Low-sodium varieties of mustard and ketchup. Lemon juice. Tabasco sauce. Horseradish.    SAMPLE 1.5 GRAM SODIUM MEAL PLAN:   Breakfast / Sodium (mg)  1 cup low-fat milk / 143 mg  1 whole-wheat English muffin / 240 mg  1 tbs heart-healthy margarine / 153 mg  1 hard-boiled egg / 139 mg  1 small orange / 0 mg  Lunch / Sodium (mg)  1 cup raw carrots / 76 mg  2 tbs no salt added peanut butter / 5 mg  2 slices whole-wheat  bread / 270 mg  1 tbs jelly / 6 mg   cup red grapes / 2 mg  Dinner / Sodium (mg)  1 cup whole-wheat pasta / 2 mg  1 cup low-sodium tomato sauce / 73 mg  3 oz lean ground beef / 57 mg  1 small side salad (1 cup raw spinach leaves,  cup cucumber,  cup yellow bell pepper)  with 1 tsp olive oil and 1 tsp red wine vinegar / 25 mg  Snack / Sodium (mg)  1 container low-fat vanilla yogurt / 107 mg  3 graham cracker squares / 127 mg  Nutrient Analysis  Calories: 1745  Protein: 75 g  Carbohydrate: 237 g  Fat: 57 g  Sodium: 1425 mg  Document Released: 01/25/2005 Document Revised: 10/07/2010 Document Reviewed: 04/28/2009  [facility] Patient Information 2012 [place], [place].

## 2016-09-10 NOTE — Progress Notes (Signed)
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 Elevated blood pressure reading- white coat   3. Tobacco use disorder- approximately 28-pack-year history   4. Encounter for counseling for tobacco use disorder   5. Pelvic floor dysfunction   6. Anxious mood   7. Seasonal allergic rhinitis, unspecified trigger   8. Other migraine without status migrainosus, not intractable   9. Gastroesophageal reflux disease, esophagitis presence not specified   10. HTN, goal below 130/80      No problem-specific Assessment & Plan notes found for this encounter.   The patient was counseled, risk factors were discussed, anticipatory guidance given.   New Prescriptions   FINASTERIDE (PROPECIA) 1 MG TABLET    Take 2 tablets (2 mg total) by mouth daily.   LOSARTAN (COZAAR) 50 MG TABLET    Take 1 tablet (50 mg total) by mouth daily.   RANITIDINE (ZANTAC) 150 MG TABLET    Take 1 tablet (150 mg total) by mouth 2 (two) times daily.     Discontinued Medications   No medications on file     Modified Medications   Modified Medication Previous Medication   BACLOFEN 5 MG TABS baclofen (LIORESAL) 10 MG tablet      Take 5 mg by mouth 2 (two) times daily.    Take 1 tablet (10 mg total) by mouth 2 (two) times daily.   BUPROPION (WELLBUTRIN XL) 150 MG 24 HR TABLET buPROPion (WELLBUTRIN XL) 150 MG 24 hr tablet      Take 1 tablet (150 mg total) by mouth every morning.    Take 1 tablet (150 mg total) by mouth every morning.   MONTELUKAST (SINGULAIR) 10 MG TABLET montelukast (SINGULAIR) 10 MG tablet      Take 1 tablet (10 mg total) by mouth at bedtime.    Take 10 mg by mouth at bedtime.   SUMATRIPTAN (IMITREX) 100 MG TABLET SUMAtriptan (IMITREX) 100 MG tablet      Take 1 tablet (100 mg total) by mouth daily as needed for migraine. At onset of HA          No orders of the defined types were placed in this encounter.    Gross side effects, risk and benefits, and alternatives of  medications and treatment plan in general discussed with patient.  Patient is aware that all medications have potential side effects and we are unable to predict every side effect or drug-drug interaction that may occur.   Patient will call with any questions prior to using medication if they have concerns.  Expresses verbal understanding and consents to current therapy and treatment regimen.  No barriers to understanding were identified.  Red flag symptoms and signs discussed in detail.  Patient expressed understanding regarding what to do in case of emergency\urgent symptoms  Please see AVS handed out to patient at the end of our visit for further patient instructions/ counseling done pertaining to today's office visit.   Return in about 6 weeks (around 10/22/2016) for started BP med, reck Bp- bring LOG from HOME.     Note: This document was prepared using Dragon voice recognition software and may include unintentional dictation errors.  Mellody Dance 11:31 AM --------------------------------------------------------------------------------------------------------------------------------------------------------------------------------------------------------------------------------------------    Subjective:    CC:  Chief Complaint  Patient presents with  . Follow-up    HPI: Paul Schroeder is a 46 y.o. male who presents to [place] at [facility] today for issues as  discussed below.  GAD:  Wellbutrin-  We started him on last OV-  keeps him more even.  Less anxiety, 2 wks now.   No s-e.    SMoking: Cut back to 1/2 ppd.  No quit date yet.   Wants to switch baclofen from Urology to here from now on.  1 yr ago saw Dr Sharin Grave for his pelvic floor dysfunction.  On stable txmnt plan for this.    He takes 1/2 tab baclofen and advil 3 tabs twice daily.    He also   GERD:  Zantac--- started this along with the advil that we d/c pt last OV.   Has def made him feel better-  less gassey feelings, less burping, less farting.   HTN:  At home-- Bp's are 150's / 99 or so.  Feels "heat in his face" and can tell when B P goes up.    ED-  for couple yrs now--> about 6 mo into starting baclofen.   Never tired med sin past.   Problem  Anxious Mood  Gastroesophageal Reflux Disease  Htn, Goal Below 130/80     Wt Readings from Last 3 Encounters:  09/10/16 179 lb (81.2 kg)  08/23/16 177 lb (80.3 kg)  08/12/16 173 lb 6.4 oz (78.7 kg)   BP Readings from Last 3 Encounters:  09/10/16 (!) 154/99  08/23/16 (!) 141/93  08/12/16 (!) 151/99   Pulse Readings from Last 3 Encounters:  09/10/16 60  08/23/16 80  08/12/16 82   BMI Readings from Last 3 Encounters:  09/10/16 25.68 kg/m  08/23/16 25.40 kg/m  08/12/16 24.88 kg/m     Patient Care Team    Relationship Specialty Notifications Start End  Mellody Dance, DO PCP - General Family Medicine  08/12/16   Nori Riis, PA-C Physician Assistant Urology  03/10/15    Comment: REF PA  TO DR Jamal Collin 03-10-15  UNDER DR Cyndra Numbers, MD  General Surgery  03/10/15   Allyn Kenner, MD Consulting Physician Dermatology  08/12/16    Comment: Every 6 months goes for skin screenings due to his history of melanoma.  McKenzie, Candee Furbish, MD Consulting Physician Urology  08/12/16      Patient Active Problem List   Diagnosis Date Noted  . Elevated blood pressure reading- white coat 08/12/2016    Priority: High  . Tobacco use disorder- approximately 28-pack-year history 08/12/2016    Priority: High  . History of panic attacks 08/12/2016    Priority: High  . Adjustment disorder with mixed anxiety and depressed mood- since teenager 08/12/2016    Priority: High  . 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 )  08/12/2016    Priority: Medium  . Pelvic floor dysfunction- treated by urology 04/22/2015    Priority: Medium  . Environmental and seasonal allergies 08/12/2016    Priority: Low  .  Seasonal allergic rhinitis 08/12/2016    Priority: Low  . H/O non anemic vitamin B12 deficiency 08/12/2016    Priority: Low  . H/O iron deficiency anemia 08/12/2016    Priority: Low  . Encounter for counseling for tobacco use disorder 08/12/2016    Priority: Low  . Family history of mood disorder- MOM 08/12/2016    Priority: Low  . Anxious mood 09/10/2016  . Gastroesophageal reflux disease 09/10/2016  . HTN, goal below 130/80 09/10/2016  . 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 Melanoma of skin ([place]) 08/12/2016  . Dysuria 10/07/2015  . Right groin  pain 03/05/2015  . Hematospermia 02/22/2015  . Epididymitis, right 02/22/2015    Past Medical history, Surgical history, Family history, Social history, Allergies and Medications have been entered into the medical record, reviewed and changed as needed.    Current Meds  Medication Sig  . baclofen 5 MG TABS Take 5 mg by mouth 2 (two) times daily.  Marland Kitchen BIOTIN 5000 PO Take by mouth.  Marland Kitchen buPROPion (WELLBUTRIN XL) 150 MG 24 hr tablet Take 1 tablet (150 mg total) by mouth every morning.  . cetirizine (ZYRTEC) 1 MG/ML syrup Take 10 mg by mouth daily. Reported on 03/06/2015  . fexofenadine (ALLEGRA) 180 MG tablet Take 1 tablet (180 mg total) by mouth daily.  . finasteride (PROSCAR) 5 MG tablet Take 1/4 tablet by mouth daily  . mometasone (NASONEX) 50 MCG/ACT nasal spray Place 2 sprays into the nose daily.  . montelukast (SINGULAIR) 10 MG tablet Take 1 tablet (10 mg total) by mouth at bedtime.  . Multiple Vitamin (ONE-A-DAY ESSENTIAL PO) Take by mouth.  . Probiotic Product (FORTIFY DAILY PROBIOTIC PO) Take by mouth.  . SUMAtriptan (IMITREX) 100 MG tablet Take 1 tablet (100 mg total) by mouth daily as needed for migraine. At onset of HA  . [DISCONTINUED] baclofen (LIORESAL) 10 MG tablet Take 1 tablet (10 mg total) by mouth 2 (two) times daily.  . [DISCONTINUED] buPROPion (WELLBUTRIN XL) 150 MG 24 hr tablet Take 1 tablet (150 mg total) by mouth every  morning.  . [DISCONTINUED] montelukast (SINGULAIR) 10 MG tablet Take 10 mg by mouth at bedtime.  . [DISCONTINUED] SUMAtriptan (IMITREX) 100 MG tablet     Allergies:  No Known Allergies   Review of Systems: General:   Denies fever, chills, unexplained weight loss.  Optho/Auditory:   Denies visual changes, blurred vision/LOV Respiratory:   Denies wheeze, DOE more than baseline levels.  Cardiovascular:   Denies chest pain, palpitations, new onset peripheral edema  Gastrointestinal:   Denies nausea, vomiting, diarrhea, abd pain.  Genitourinary: Denies dysuria, freq/ urgency, flank pain or discharge from genitals.  Endocrine:     Denies hot or cold intolerance, polyuria, polydipsia. Musculoskeletal:   Denies unexplained myalgias, joint swelling, unexplained arthralgias, gait problems.  Skin:  Denies new onset rash, suspicious lesions Neurological:     Denies dizziness, unexplained weakness, numbness  Psychiatric/Behavioral:   Denies mood changes, suicidal or homicidal ideations, hallucinations    Objective:   Blood pressure (!) 154/99, pulse 60, height 5\' 10"  (1.778 m), weight 179 lb (81.2 kg). Body mass index is 25.68 kg/m. General:  Well Developed, well nourished, appropriate for stated age.  Neuro:  Alert and oriented,  extra-ocular muscles intact  HEENT:  Normocephalic, atraumatic, neck supple, no carotid bruits appreciated  Skin:  no gross rash, warm, pink. Cardiac:  RRR, S1 S2 Respiratory:  ECTA B/L and A/P, Not using accessory muscles, speaking in full sentences- unlabored. Vascular:  Ext warm, no cyanosis apprec.; cap RF less 2 sec. Psych:  No HI/SI, judgement and insight good, Euthymic mood. Full Affect.

## 2016-09-14 ENCOUNTER — Other Ambulatory Visit: Payer: Self-pay

## 2016-09-14 DIAGNOSIS — M6289 Other specified disorders of muscle: Secondary | ICD-10-CM

## 2016-09-14 DIAGNOSIS — R1031 Right lower quadrant pain: Secondary | ICD-10-CM

## 2016-09-14 MED ORDER — BACLOFEN 10 MG PO TABS: 5.0000 mg | ORAL_TABLET | Freq: Two times a day (BID) | ORAL | 0 refills | Status: DC

## 2016-09-14 NOTE — Telephone Encounter (Signed)
[facility] sent over a request to change medication strength.  Baclofen 5mg  is on back order per the pharmacy Baclofen 10mg  is available and os partially scored so the patient can cut in half if needed. Sent request to Dr. Raliegh Scarlet for review. MPulliam, CMA/RT(R)

## 2016-10-28 ENCOUNTER — Encounter: Payer: Self-pay | Admitting: Family Medicine

## 2016-10-28 ENCOUNTER — Ambulatory Visit (INDEPENDENT_AMBULATORY_CARE_PROVIDER_SITE_OTHER): Payer: 59 | Admitting: Family Medicine

## 2016-10-28 ENCOUNTER — Other Ambulatory Visit: Payer: Self-pay | Admitting: Family Medicine

## 2016-10-28 VITALS — BP 150/98 | HR 58 | Ht 70.0 in | Wt 177.1 lb

## 2016-10-28 DIAGNOSIS — I10 Essential (primary) hypertension: Secondary | ICD-10-CM | POA: Diagnosis not present

## 2016-10-28 DIAGNOSIS — F17200 Nicotine dependence, unspecified, uncomplicated: Secondary | ICD-10-CM

## 2016-10-28 DIAGNOSIS — R030 Elevated blood-pressure reading, without diagnosis of hypertension: Secondary | ICD-10-CM

## 2016-10-28 DIAGNOSIS — F4323 Adjustment disorder with mixed anxiety and depressed mood: Secondary | ICD-10-CM | POA: Diagnosis not present

## 2016-10-28 DIAGNOSIS — G43101 Migraine with aura, not intractable, with status migrainosus: Secondary | ICD-10-CM

## 2016-10-28 DIAGNOSIS — Z5181 Encounter for therapeutic drug level monitoring: Secondary | ICD-10-CM

## 2016-10-28 DIAGNOSIS — G43809 Other migraine, not intractable, without status migrainosus: Secondary | ICD-10-CM

## 2016-10-28 MED ORDER — LOSARTAN POTASSIUM 100 MG PO TABS: 100.0000 mg | ORAL_TABLET | Freq: Every day | ORAL | 1 refills | Status: DC

## 2016-10-28 MED ORDER — VERAPAMIL HCL ER 120 MG PO TBCR: 120.0000 mg | EXTENDED_RELEASE_TABLET | Freq: Every day | ORAL | 0 refills | Status: DC

## 2016-10-28 NOTE — Patient Instructions (Addendum)
2-3 weeks follow-up for blood pressure.  Continue to please check your blood pressure daily or every other day at least.  Please check it not just when you're relaxed or when you're tense but random times.  Also please try to avoid any nicotine or caffeine half hour prior.

## 2016-10-28 NOTE — Progress Notes (Signed)
Impression and Recommendations:    1. Migraine with aura and with status migrainosus, not intractable   2. HTN, goal below 130/80   3. Tobacco use disorder- approximately 28-pack-year history   4. Elevated blood pressure reading- white coat   5. Adjustment disorder with mixed anxiety and depressed mood- since teenager   6. Medication monitoring encounter    - Increase losartan from 50 to 100 mg daily.  Patient will take 2 of his 35s until he gets a new prescription for the 100 mg. - We will add Calan SR or verapamil long acting or his blood pressure as well as for the fact that he has bad migraines.  We're hoping this will help relieve those symptoms and decreased severity of migraines as well as help his blood pressure - Patient will obtain CMP next office visit so we can check his calcium levels as well as his electrolytes and kidney function.  - Continue to wean down nicotine usage.  Congratulated patient on his progress thus far.  Quit date of October 1.  Migraine with aura and with status migrainosus, not intractable - Plan: verapamil (CALAN-SR) 120 MG CR tablet, Comprehensive metabolic panel  HTN, goal below 130/80 - Plan: losartan (COZAAR) 100 MG tablet, verapamil (CALAN-SR) 120 MG CR tablet, Comprehensive metabolic panel, CANCELED: Basic metabolic panel  Tobacco use disorder- approximately 28-pack-year history  Elevated blood pressure reading- white coat  Adjustment disorder with mixed anxiety and depressed mood- since teenager  Medication monitoring encounter - Plan: Comprehensive metabolic panel, CANCELED: Basic metabolic panel  No problem-specific Assessment & Plan notes found for this encounter.    Education and routine counseling performed. Handouts provided.   New Prescriptions   VERAPAMIL (CALAN-SR) 120 MG CR TABLET    Take 1 tablet (120 mg total) by mouth at bedtime.    Discontinued Medications   CETIRIZINE (ZYRTEC) 1 MG/ML SYRUP    Take 10 mg by mouth  daily. Reported on 03/06/2015    Modified Medications   Modified Medication Previous Medication   LOSARTAN (COZAAR) 100 MG TABLET losartan (COZAAR) 50 MG tablet      Take 1 tablet (100 mg total) by mouth daily.    Take 1 tablet (50 mg total) by mouth daily.    Orders Placed This Encounter  Procedures  . Comprehensive metabolic panel     Return for 2-3 wks- bp reck and will need lab.  The patient was counseled, risk factors were discussed, anticipatory guidance given.  Gross side effects, risk and benefits, and alternatives of medications discussed with patient.  Patient is aware that all medications have potential side effects and we are unable to predict every side effect or drug-drug interaction that may occur.  Expresses verbal understanding and consents to current therapy plan and treatment regimen.  Please see AVS handed out to patient at the end of our visit for further patient instructions/ counseling done pertaining to today's office visit.    Note: This document was prepared using Dragon voice recognition software and may include unintentional dictation errors.     Subjective:    Chief Complaint  Patient presents with  . Follow-up    HTN    HPI: Paul Schroeder is a 46 y.o. male who presents to [place] at [facility] today for follow up for HTN- last office visit we started him on losartan 59 g daily.  He's here for follow-up..     Problem  Tobacco use disorder- approximately  28-pack-year history   Stopped smoking 2 wks ago b/c his quit date of October 1 he felt was too far away he did not want to wait till then.  He's been using the vaping occ- TID of the lowest amnt of nicotine---> 2.6.   Also using the patches every other day and then the vaping every other day.  By October 1 he plans on not using any nicotine products.  He's been very successful and slowly changing his daily habits to exclude it altogether by October 1   Gastroesophageal  Reflux Disease    Propecia- Per patient his insurance would only pay for 5 mg tablets.  He cuts it in two quarters every day and takes a quarter tablet daily.   HTN:  -  His blood pressure has not been controlled at home.  178/126, 151/96, 165/113, 170/113, 151/96-->  - checks his BP prior to caffeine intake or nicotine intake.    Patient is completely asymptomatic.  - Patient reports good compliance with blood pressure medications  - Denies medication S-E   - Smoking Status noted - weening  - He denies new onset of: chest pain, exercise intolerance, shortness of breath, dizziness, visual changes, headache, lower extremity swelling or claudication.   Today their BP is BP: (!) 150/98   Last 3 blood pressure readings in our office are as follows: BP Readings from Last 3 Encounters:  10/28/16 (!) 150/98  09/10/16 (!) 154/99  08/23/16 (!) 141/93    Pulse Readings from Last 3 Encounters:  10/28/16 (!) 58  09/10/16 60  08/23/16 80    Filed Weights   10/28/16 0934  Weight: 177 lb 1.6 oz (80.3 kg)      Patient Care Team    Relationship Specialty Notifications Start End  Mellody Dance, DO PCP - General Family Medicine  08/12/16   Nori Riis, PA-C Physician Assistant Urology  03/10/15    Comment: REF PA  TO DR Jamal Collin 03-10-15  UNDER DR Cyndra Numbers, MD  General Surgery  03/10/15   Allyn Kenner, MD Consulting Physician Dermatology  08/12/16    Comment: Every 6 months goes for skin screenings due to his history of melanoma.  McKenzie, Candee Furbish, MD Consulting Physician Urology  08/12/16      Lab Results  Component Value Date   CREATININE 0.75 (L) 08/23/2016   BUN 10 08/23/2016   NA 144 08/23/2016   K 3.8 08/23/2016   CL 101 08/23/2016   CO2 26 08/23/2016    Lab Results  Component Value Date   CHOL 150 08/23/2016   CHOL 186 07/26/2014    Lab Results  Component Value Date   HDL 58 08/23/2016   HDL 48 07/26/2014    Lab Results    Component Value Date   LDLCALC 70 08/23/2016   [place] 111 07/26/2014    Lab Results  Component Value Date   TRIG 111 08/23/2016   TRIG 135 07/26/2014    Lab Results  Component Value Date   CHOLHDL 2.6 08/23/2016    No results found for: LDLDIRECT ===================================================================   Patient Active Problem List   Diagnosis Date Noted  . Elevated blood pressure reading- white coat 08/12/2016    Priority: High  . Tobacco use disorder- approximately 28-pack-year history 08/12/2016    Priority: High  . History of panic attacks 08/12/2016    Priority: High  . Adjustment disorder with mixed anxiety and depressed mood- since teenager 08/12/2016  Priority: High  . Gastroesophageal reflux disease 09/10/2016    Priority: Medium  . 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 )  08/12/2016    Priority: Medium  . Pelvic floor dysfunction- treated by urology 04/22/2015    Priority: Medium  . Environmental and seasonal allergies 08/12/2016    Priority: Low  . Seasonal allergic rhinitis 08/12/2016    Priority: Low  . H/O non anemic vitamin B12 deficiency 08/12/2016    Priority: Low  . H/O iron deficiency anemia 08/12/2016    Priority: Low  . Encounter for counseling for tobacco use disorder 08/12/2016    Priority: Low  . Family history of mood disorder- MOM 08/12/2016    Priority: Low  . Anxious mood 09/10/2016  . HTN, goal below 130/80 09/10/2016  . 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 Melanoma of skin ([place]) 08/12/2016  . Dysuria 10/07/2015  . Right groin pain 03/05/2015  . Hematospermia 02/22/2015  . Epididymitis, right 02/22/2015     Past Medical History:  Diagnosis Date  . Allergy   . Anxiety   . Bronchitis   . Cancer [facility]) 2013   Malignant mole on his back removed   . Hematospermia   . Hemorrhoids   . Migraines    family Hx, triggered by stress  . Seasonal allergies      Past Surgical History:  Procedure  Laterality Date  . skin lesions removed       Family History  Problem Relation Age of Onset  . Hypertension Mother   . Hyperlipidemia Mother   . Stroke Mother   . Depression Mother   . Kidney disease Brother   . Kidney disease Maternal Grandfather   . Heart disease Maternal Grandfather   . Prostate cancer Neg Hx      History  Drug Use  . Frequency: 2.0 times per week    Comment: marijuana  ,  History  Alcohol Use  . 2.4 oz/week  . 4 Shots of liquor per week  ,  History  Smoking Status  . Current Every Day Smoker  . Packs/day: 1.00  . Years: 3.00  . Types: Cigarettes  Smokeless Tobacco  . Never Used  ,    Current Outpatient Prescriptions on File Prior to Visit  Medication Sig Dispense Refill  . baclofen (LIORESAL) 10 MG tablet Take 0.5 tablets (5 mg total) by mouth 2 (two) times daily. 90 tablet 0  . BIOTIN 5000 PO Take by mouth.    Marland Kitchen buPROPion (WELLBUTRIN XL) 150 MG 24 hr tablet Take 1 tablet (150 mg total) by mouth every morning. 90 tablet 1  . fexofenadine (ALLEGRA) 180 MG tablet Take 1 tablet (180 mg total) by mouth daily. 90 tablet 1  . finasteride (PROSCAR) 5 MG tablet Take 1/4 tablet by mouth daily 45 tablet 2  . mometasone (NASONEX) 50 MCG/ACT nasal spray Place 2 sprays into the nose daily.    . montelukast (SINGULAIR) 10 MG tablet Take 1 tablet (10 mg total) by mouth at bedtime. 90 tablet 1  . Multiple Vitamin (ONE-A-DAY ESSENTIAL PO) Take by mouth.    . Probiotic Product (FORTIFY DAILY PROBIOTIC PO) Take by mouth.    . ranitidine (ZANTAC) 150 MG tablet Take 1 tablet (150 mg total) by mouth 2 (two) times daily.    . SUMAtriptan (IMITREX) 100 MG tablet Take 1 tablet (100 mg total) by mouth daily as needed for migraine. At onset of HA 10 tablet 0  . finasteride (PROPECIA) 1  MG tablet Take 2 tablets (2 mg total) by mouth daily. 180 tablet 1   No current facility-administered medications on file prior to visit.      No Known Allergies   Review of  Systems:   General:  Denies fever, chills Optho/Auditory:   Denies visual changes, blurred vision Respiratory:   Denies SOB, cough, wheeze, DIB  Cardiovascular:   Denies chest pain, palpitations, painful respirations Gastrointestinal:   Denies nausea, vomiting, diarrhea.  Endocrine:     Denies new hot or cold intolerance Musculoskeletal:  Denies joint swelling, gait issues, or new unexplained myalgias/ arthralgias Skin:  Denies rash, suspicious lesions  Neurological:    Denies dizziness, unexplained weakness, numbness  Psychiatric/Behavioral:   Denies mood changes  Objective:    Blood pressure (!) 150/98, pulse (!) 58, height 5\' 10"  (1.778 m), weight 177 lb 1.6 oz (80.3 kg).  Body mass index is 25.41 kg/m.  General: Well Developed, well nourished, and in no acute distress.  HEENT: Normocephalic, atraumatic, pupils equal round reactive to light, neck supple, No carotid bruits, no JVD Skin: Warm and dry, cap RF less 2 sec Cardiac: Regular rate and rhythm, S1, S2 WNL's, no murmurs rubs or gallops Respiratory: ECTA B/L, Not using accessory muscles, speaking in full sentences. NeuroM-Sk: Ambulates w/o assistance, moves ext * 4 w/o difficulty, sensation grossly intact.  Ext: scant edema b/l lower ext Psych: No HI/SI, judgement and insight good, Euthymic mood. Full Affect.

## 2016-11-16 ENCOUNTER — Other Ambulatory Visit: Payer: Self-pay | Admitting: Family Medicine

## 2016-11-16 DIAGNOSIS — G43809 Other migraine, not intractable, without status migrainosus: Secondary | ICD-10-CM

## 2016-12-03 ENCOUNTER — Encounter: Payer: Self-pay | Admitting: Family Medicine

## 2016-12-03 ENCOUNTER — Ambulatory Visit (INDEPENDENT_AMBULATORY_CARE_PROVIDER_SITE_OTHER): Payer: 59 | Admitting: Family Medicine

## 2016-12-03 DIAGNOSIS — F419 Anxiety disorder, unspecified: Secondary | ICD-10-CM

## 2016-12-03 DIAGNOSIS — Z5181 Encounter for therapeutic drug level monitoring: Secondary | ICD-10-CM

## 2016-12-03 DIAGNOSIS — F17200 Nicotine dependence, unspecified, uncomplicated: Secondary | ICD-10-CM

## 2016-12-03 DIAGNOSIS — G43101 Migraine with aura, not intractable, with status migrainosus: Secondary | ICD-10-CM

## 2016-12-03 DIAGNOSIS — I10 Essential (primary) hypertension: Secondary | ICD-10-CM

## 2016-12-03 DIAGNOSIS — R1031 Right lower quadrant pain: Secondary | ICD-10-CM

## 2016-12-03 DIAGNOSIS — Z716 Tobacco abuse counseling: Secondary | ICD-10-CM

## 2016-12-03 DIAGNOSIS — M6289 Other specified disorders of muscle: Secondary | ICD-10-CM

## 2016-12-03 MED ORDER — BUPROPION HCL ER (XL) 300 MG PO TB24: 300.0000 mg | ORAL_TABLET | Freq: Every day | ORAL | 1 refills | Status: DC

## 2016-12-03 MED ORDER — BACLOFEN 10 MG PO TABS: 5.0000 mg | ORAL_TABLET | Freq: Two times a day (BID) | ORAL | 1 refills | Status: DC

## 2016-12-03 NOTE — Patient Instructions (Addendum)
--- In the future we will consider adding buspirone to your regiment for the mood disorder and anxiety if going up on the Wellbutrin does not get [place] where we want to go  -Please give patient a blood pressure log that he can use at home to write down times he takes his blood pressure.  After our discussion we will not make any changes with your blood pressure medicine today but just with the mood meds above.  Please check your blood pressure random times during the day not the same time a day every day and bring in next office visit.     [email or web address]  Monitoring Your Blood Pressure at Home HBP how to monitor imageHow to use a home blood pressure monitor Be still. Don't smoke, drink caffeinated beverages or exercise within 30 minutes before measuring your blood pressure. Empty your bladder and ensure at least 5 minutes of quiet rest before measurements.  Sit correctly. Sit with your back straight and supported (on a dining chair, rather than a sofa). Your feet should be flat on the floor and your legs should not be crossed. Your arm should be supported on a flat surface (such as a table) with the upper arm at heart level. Make sure the bottom of the cuff is placed directly above the bend of the elbow. Check your monitor's instructions for an illustration or have your healthcare provider show you how. Measure at the same time every day. It's important to take the readings at the same time each day, such as morning and evening. It is best to take the readings daily however ideally beginning 2 weeks after a change in treatment and during the week before your next appointment. Take multiple readings and record the results. Each time you measure, take two or three readings one minute apart and record the results using a printable (PDF) or online tracker. If your monitor has built-in  memory to store your readings, take it with you to your appointments. Some monitors may also allow you to upload your readings to a secure website after you register your profile. Don't take the measurement over clothes. Download a PDF sheet that shows you how to measure your blood pressure properly. Also available in [place], [place] and text only.  Know your numbers. Learn what the numbers in your blood pressure reading mean.  BLOOD PRESSURE CATEGORY SYSTOLIC mm Hg (upper number)   DIASTOLIC mm Hg (lower number) NORMAL LESS THAN 120 and LESS THAN 80 ELEVATED 120 - 129 and LESS THAN 80 HIGH BLOOD PRESSURE (HYPERTENSION) STAGE 1 130 - 139 or 80 - 89 HIGH BLOOD PRESSURE (HYPERTENSION) STAGE 2 140 OR HIGHER or 90 OR HIGHER HYPERTENSIVE CRISIS  (consult your doctor immediately) HIGHER THAN 180 and/or HIGHER THAN 120 Note: A diagnosis of high blood pressure must be confirmed with a medical professional. A doctor should also evaluate any unusually low blood pressure readings.  Download this chart: [place]  Spanish  Traditional [place]  If you get a high blood pressure reading A single high reading is not an immediate cause for alarm. If you get a reading that is slightly or moderately higher than normal, take your blood pressure a few more times and consult your healthcare professional to verify if there' s a health concern or whether there may be any issues with your monitor. If your blood pressure readings suddenly exceed 180/120 mm Hg, wait five minutes and test again. If your readings are still unusually high, contact your doctor immediately.  You could be experiencing a hypertensive crisis. If your blood pressure is higher than 180/120 mm Hg and you are experiencing signs of possible organ damage such as chest pain, shortness of breath, back pain, numbness/weakness, change in vision, difficulty speaking, do not wait to see if your pressure comes down on its own. Call 9-1-1. AHA  Recommendation The American Heart Association recommends home monitoring for all people with high blood pressure to help the healthcare provider determine whether treatments are working. Home monitoring (self-measured blood pressure) is not a substitute for regular visits to your physician. If you have been prescribed medication to lower your blood pressure, don't stop taking your medication without consulting your doctor, even if your blood pressure readings are in the normal range during home monitoring.  Choosing a home blood pressure monitor The American Heart Association recommends an automatic, cuff-style, bicep (upper-arm) monitor.  Wrist and finger monitors are not recommended because they yield less reliable readings. Choose a monitor that has been validated. If you are unsure, ask your doctor or pharmacist for advice. When selecting a blood pressure monitor for a senior, pregnant woman or child, make sure it is validated for these conditions. Make sure the cuff fits - measure around your upper arm and choose a monitor that comes with the correct size cuff. Once you've purchased your monitor, bring it to your next appointment Have your doctor check to see that you are using it correctly and getting the same results as the equipment in the office. Plan to bring your monitor in once a year to make sure the readings are accurate.  Home blood pressure monitoring may be especially useful for: Anyone diagnosed with high blood pressure (HBP or hypertension). Individuals starting high blood pressure treatment to determine its effectiveness. People requiring closer monitoring, especially individuals with risk factors for high blood pressure and/or conditions  related to high blood pressure.  Pregnant women, experiencing pregnancy-induced hypertension and/or preeclampsia.  Evaluating potentially false readings, like: People who only have high readings at the doctor' s office ("white coat"  hypertension).  People who only have high readings at home but not at the doctor' s office ("masked" hypertension). NOTE: People with atrial fibrillation or other arrhythmias may not be good candidates for home monitoring because electronic home blood pressure devices may not be able to give accurate measurements. Ask your doctor to recommend a monitoring method that works for you. Left-arm vs. right-arm blood pressure Several studies have been done to determine what is a normal variation between right and left arm. In general, any difference of 10 mm Hg or less is considered normal and is not a cause for concern.  Why keep a blood pressure journal? One blood pressure measurement is like a snapshot. It only tells what your blood pressure is at that moment. A record of readings taken over time provides a "time-lapse" picture of your blood pressure that can help you partner with your physician to ensure that your treatments  to lower high blood pressure  (HBP or hypertension) are working.

## 2016-12-03 NOTE — Progress Notes (Signed)
Impression and Recommendations:    1. Pelvic floor dysfunction   2. Right groin pain   3. Tobacco use disorder- approximately 28-pack-year history   4. Encounter for counseling for tobacco use disorder   5. Anxious mood     1. Pelvic floor dysfunction - baclofen (LIORESAL) 10 MG tablet; Take 0.5 tablets (5 mg total) by mouth 2 (two) times daily.  Dispense: 90 tablet; Refill: 0  2. Right groin pain - baclofen (LIORESAL) 10 MG tablet; Take 0.5 tablets (5 mg total) by mouth 2 (two) times daily.  Dispense: 90 tablet; Refill: 0   No problem-specific Assessment & Plan notes found for this encounter.   Education and routine counseling performed. Handouts provided.   New Prescriptions   No medications on file    Discontinued Medications   No medications on file    Modified Medications   Modified Medication Previous Medication   BACLOFEN (LIORESAL) 10 MG TABLET baclofen (LIORESAL) 10 MG tablet      Take 0.5 tablets (5 mg total) by mouth 2 (two) times daily.    Take 0.5 tablets (5 mg total) by mouth 2 (two) times daily.   BUPROPION (WELLBUTRIN XL) 300 MG 24 HR TABLET buPROPion (WELLBUTRIN XL) 150 MG 24 hr tablet      Take 1 tablet (300 mg total) by mouth daily.    Take 1 tablet (150 mg total) by mouth every morning.    No orders of the defined types were placed in this encounter.    Return for 4-6 wks- reck BP and anxiety- bring in log of BP's.  The patient was counseled, risk factors were discussed, anticipatory guidance given.  Gross side effects, risk and benefits, and alternatives of medications discussed with patient.  Patient is aware that all medications have potential side effects and we are unable to predict every side effect or drug-drug interaction that may occur.  Expresses verbal understanding and consents to current therapy plan and treatment regimen.  Please see AVS handed out to patient at the end of our visit for further patient instructions/ counseling  done pertaining to today's office visit.    Note:  This document was prepared using Dragon voice recognition software and may include unintentional dictation errors.     Subjective:    Chief Complaint  Patient presents with   Follow-up    HPI: Paul Schroeder is a 46 y.o. male who presents to [facility] today for follow up for HTN.     No problems updated. --Patient has still not smoke cigarettes since September 1.  He continues to vapor occasionally.  2.6mg  nicotine vapor liquid.   Off patches- burning and itching it caused.   Gad  -still struggling although he is taking the Wellbutrin every day.  Migraines -nonexistent since starting the verapamil SR last office visit for blood pressure and migraines.  Patient is happy with this and tolerating well.     Activity-   Walking 30 min BID- 5 d/wk.  wkends- active with young girls/    HTN:  -Last office visit we increased losartan from 50-100 mg daily.  We also added Call[place] SR verapamil because of his migraines and blood pressure.  Pt tolerating the new meds well-       -Home blood pressures have been running 143/95 with a pulse of 59, 146/96 with a pulse of 55, 153/104 with a pulse of 61, 154/107 with a pulse of 82,  --->  Higher - no particular time.   - Patient reports good compliance with blood pressure medications  - Denies medication S-E   - Smoking Status noted   - He denies new onset of: chest pain, exercise intolerance, shortness of breath, dizziness, visual changes, headache, lower extremity swelling or claudication.   Today their BP is BP: (!) 173/105   Last 3 blood pressure readings in our office are as follows: BP Readings from Last 3 Encounters:  12/03/16 (!) 173/105  10/28/16 (!) 150/98  09/10/16 (!) 154/99    Pulse Readings from Last 3 Encounters:  12/03/16 79  10/28/16 (!) 58  09/10/16 60    Filed Weights   12/03/16 1101  Weight: 173 lb 12.8 oz (78.8 kg)       Patient Care Team    Relationship Specialty Notifications Start End  Thomasene Lot, DO PCP - General Family Medicine  08/12/16   Harle Battiest, PA-C Physician Assistant Urology  03/10/15    Comment: REF PA  TO DR Evette Cristal 03-10-15  UNDER DR Cindee Lame, MD  General Surgery  03/10/15   Nita Sells, MD Consulting Physician Dermatology  08/12/16    Comment: Every 6 months goes for skin screenings due to his history of melanoma.  McKenzie, Mardene Celeste, MD Consulting Physician Urology  08/12/16      Lab Results  Component Value Date   CREATININE 0.75 (L) 08/23/2016   BUN 10 08/23/2016   NA 144 08/23/2016   K 3.8 08/23/2016   CL 101 08/23/2016   CO2 26 08/23/2016    Lab Results  Component Value Date   CHOL 150 08/23/2016   CHOL 186 07/26/2014    Lab Results  Component Value Date   HDL 58 08/23/2016   HDL 48 07/26/2014    Lab Results  Component Value Date   LDLCALC 70 08/23/2016   LDLCALC 111 07/26/2014    Lab Results  Component Value Date   TRIG 111 08/23/2016   TRIG 135 07/26/2014    Lab Results  Component Value Date   CHOLHDL 2.6 08/23/2016    No results found for: LDLDIRECT ===================================================================   Patient Active Problem List   Diagnosis Date Noted   Elevated blood pressure reading- white coat 08/12/2016    Priority: High   Tobacco use disorder- approximately 28-pack-year history 08/12/2016    Priority: High   History of panic attacks 08/12/2016    Priority: High   Adjustment disorder with mixed anxiety and depressed mood- since teenager 08/12/2016    Priority: High   Gastroesophageal reflux disease 09/10/2016    Priority: Medium   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 )  08/12/2016    Priority: Medium   Pelvic floor dysfunction- treated by urology 04/22/2015    Priority: Medium   Environmental and seasonal allergies 08/12/2016    Priority: Low   Seasonal  allergic rhinitis 08/12/2016    Priority: Low   H/O non anemic vitamin B12 deficiency 08/12/2016    Priority: Low   H/O iron deficiency anemia 08/12/2016    Priority: Low   Encounter for counseling for tobacco use disorder 08/12/2016    Priority: Low   Family history of mood disorder- MOM 08/12/2016    Priority: Low   Anxious mood 09/10/2016   HTN, goal below 130/80 09/10/2016   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   Melanoma of skin (HCC) 08/12/2016   Dysuria 10/07/2015   Right groin pain 03/05/2015   Hematospermia  02/22/2015   Epididymitis, right 02/22/2015     Past Medical History:  Diagnosis Date   Allergy    Anxiety    Bronchitis    Cancer (HCC) 2013   Malignant mole on his back removed    Hematospermia    Hemorrhoids    Migraines    family Hx, triggered by stress   Seasonal allergies      Past Surgical History:  Procedure Laterality Date   skin lesions removed       Family History  Problem Relation Age of Onset   Hypertension Mother    Hyperlipidemia Mother    Stroke Mother    Depression Mother    Kidney disease Brother    Kidney disease Maternal Grandfather    Heart disease Maternal Grandfather    Prostate cancer Neg Hx      History  Drug Use   Frequency: 2.0 times per week    Comment: marijuana  ,  History  Alcohol Use   2.4 oz/week   4 Shots of liquor per week  ,  History  Smoking Status   Current Every Day Smoker   Packs/day: 1.00   Years: 3.00   Types: Cigarettes  Smokeless Tobacco   Never Used  ,    Current Outpatient Prescriptions on File Prior to Visit  Medication Sig Dispense Refill   BIOTIN 5000 PO Take by mouth.     fexofenadine (ALLEGRA) 180 MG tablet Take 1 tablet (180 mg total) by mouth daily. 90 tablet 1   finasteride (PROPECIA) 1 MG tablet Take 2 tablets (2 mg total) by mouth daily. 180 tablet 1   finasteride (PROSCAR) 5 MG tablet Take 1/4 tablet by mouth daily 45 tablet 2   losartan (COZAAR) 100 MG tablet Take 1  tablet (100 mg total) by mouth daily. 90 tablet 1   mometasone (NASONEX) 50 MCG/ACT nasal spray Place 2 sprays into the nose daily.     montelukast (SINGULAIR) 10 MG tablet Take 1 tablet (10 mg total) by mouth at bedtime. 90 tablet 1   Multiple Vitamin (ONE-A-DAY ESSENTIAL PO) Take by mouth.     Probiotic Product (FORTIFY DAILY PROBIOTIC PO) Take by mouth.     ranitidine (ZANTAC) 150 MG tablet Take 1 tablet (150 mg total) by mouth 2 (two) times daily.     SUMAtriptan (IMITREX) 100 MG tablet TAKE 1 TABLET DAILY AS     NEEDED FOR MIGRAINE AT     ONSET OF HEADACHE. 9 tablet 0   verapamil (CALAN-SR) 120 MG CR tablet Take 1 tablet (120 mg total) by mouth at bedtime. 90 tablet 0   No current facility-administered medications on file prior to visit.      No Known Allergies   Review of Systems:   General:  Denies fever, chills Optho/Auditory:   Denies visual changes, blurred vision Respiratory:   Denies SOB, cough, wheeze, DIB  Cardiovascular:   Denies chest pain, palpitations, painful respirations Gastrointestinal:   Denies nausea, vomiting, diarrhea.  Endocrine:     Denies new hot or cold intolerance Musculoskeletal:  Denies joint swelling, gait issues, or new unexplained myalgias/ arthralgias Skin:  Denies rash, suspicious lesions  Neurological:    Denies dizziness, unexplained weakness, numbness  Psychiatric/Behavioral:   Denies mood changes  Objective:    Blood pressure (!) 173/105, pulse 79, height 5\' 10"  (1.778 m), weight 173 lb 12.8 oz (78.8 kg).  Body mass index is 24.94 kg/m.  General: Well Developed, well nourished, and in no  acute distress.  HEENT: Normocephalic, atraumatic, pupils equal round reactive to light, neck supple, No carotid bruits, no JVD Skin: Warm and dry, cap RF less 2 sec Cardiac: Regular rate and rhythm, S1, S2 WNL's, no murmurs rubs or gallops Respiratory: ECTA B/L, Not using accessory muscles, speaking in full sentences. NeuroM-Sk: Ambulates w/o  assistance, moves ext * 4 w/o difficulty, sensation grossly intact.  Ext: scant edema b/l lower ext Psych: No HI/SI, judgement and insight good, Euthymic mood. Full Affect.

## 2016-12-04 LAB — COMPREHENSIVE METABOLIC PANEL
ALT: 21 IU/L (ref 0–44)
AST: 17 IU/L (ref 0–40)
Albumin/Globulin Ratio: 2.5 — ABNORMAL HIGH (ref 1.2–2.2)
Albumin: 5 g/dL (ref 3.5–5.5)
Alkaline Phosphatase: 74 IU/L (ref 39–117)
BUN/Creatinine Ratio: 15 (ref 9–20)
BUN: 12 mg/dL (ref 6–24)
Bilirubin Total: 0.5 mg/dL (ref 0.0–1.2)
CALCIUM: 9.8 mg/dL (ref 8.7–10.2)
CO2: 28 mmol/L (ref 20–29)
CREATININE: 0.81 mg/dL (ref 0.76–1.27)
Chloride: 103 mmol/L (ref 96–106)
GFR, EST AFRICAN AMERICAN: 123 mL/min/{1.73_m2} (ref >59)
GFR, EST NON AFRICAN AMERICAN: 107 mL/min/{1.73_m2} (ref >59)
GLUCOSE: 88 mg/dL (ref 65–99)
Globulin, Total: 2 g/dL (ref 1.5–4.5)
Potassium: 3.8 mmol/L (ref 3.5–5.2)
Sodium: 144 mmol/L (ref 134–144)
TOTAL PROTEIN: 7 g/dL (ref 6.0–8.5)

## 2016-12-06 ENCOUNTER — Other Ambulatory Visit: Payer: Self-pay | Admitting: Family Medicine

## 2016-12-06 DIAGNOSIS — G43809 Other migraine, not intractable, without status migrainosus: Secondary | ICD-10-CM

## 2016-12-06 NOTE — Telephone Encounter (Signed)
Please review and refill if appropriate.  Pt just received 9 tablets on 11/17/16.  Charyl Bigger, CMA

## 2016-12-28 ENCOUNTER — Other Ambulatory Visit: Payer: Self-pay | Admitting: Family Medicine

## 2016-12-28 DIAGNOSIS — G43809 Other migraine, not intractable, without status migrainosus: Secondary | ICD-10-CM

## 2016-12-28 NOTE — Telephone Encounter (Signed)
Last time I saw patient or 1 of the last times, in July 2018 patient noted that his migraines stopped once he started baclofen.    No it is not okay he goes through this much migraine medicine in that short of a time period.  He will be having rebound headaches since he is going through so much.    We will need to consider a different daily suppressive medicine to help control his migraines instead of just relying on abortive therapies.

## 2016-12-28 NOTE — Telephone Encounter (Signed)
May the patient have more than 9 tablets since he has gone thru 9 tablets in 3 weeks?  Charyl Bigger, CMA

## 2016-12-29 NOTE — Telephone Encounter (Signed)
Attempted to call pt.  Call would not go thru.  Charyl Bigger, CMA

## 2017-01-05 NOTE — Telephone Encounter (Signed)
LVM for pt to call to discuss.  T. [name], CMA  

## 2017-01-06 NOTE — Telephone Encounter (Signed)
Spoke with patient who states that he does not need this refill.  He has only had a couple of headaches since his last OV and believes this refill request must be related to mail order pharmacy attempting to get a 90 day supply, rather than a 30 day supply.  Pt states he will discuss headaches at his next OV.  Charyl Bigger, CMA

## 2017-01-21 ENCOUNTER — Ambulatory Visit: Payer: 59 | Admitting: Family Medicine

## 2017-02-02 ENCOUNTER — Other Ambulatory Visit: Payer: Self-pay | Admitting: Family Medicine

## 2017-02-02 DIAGNOSIS — I10 Essential (primary) hypertension: Secondary | ICD-10-CM

## 2017-02-02 DIAGNOSIS — G43101 Migraine with aura, not intractable, with status migrainosus: Secondary | ICD-10-CM

## 2017-02-14 ENCOUNTER — Other Ambulatory Visit: Payer: Self-pay | Admitting: Family Medicine

## 2017-02-14 DIAGNOSIS — Z716 Tobacco abuse counseling: Secondary | ICD-10-CM

## 2017-02-14 DIAGNOSIS — F17200 Nicotine dependence, unspecified, uncomplicated: Secondary | ICD-10-CM

## 2017-02-14 DIAGNOSIS — F419 Anxiety disorder, unspecified: Secondary | ICD-10-CM

## 2017-02-25 ENCOUNTER — Encounter: Payer: Self-pay | Admitting: Family Medicine

## 2017-02-25 ENCOUNTER — Ambulatory Visit (INDEPENDENT_AMBULATORY_CARE_PROVIDER_SITE_OTHER): Payer: 59 | Admitting: Family Medicine

## 2017-02-25 VITALS — BP 145/88 | HR 63 | Ht 70.0 in | Wt 175.5 lb

## 2017-02-25 DIAGNOSIS — I10 Essential (primary) hypertension: Secondary | ICD-10-CM

## 2017-02-25 DIAGNOSIS — Z716 Tobacco abuse counseling: Secondary | ICD-10-CM

## 2017-02-25 DIAGNOSIS — N529 Male erectile dysfunction, unspecified: Secondary | ICD-10-CM | POA: Diagnosis not present

## 2017-02-25 DIAGNOSIS — F4323 Adjustment disorder with mixed anxiety and depressed mood: Secondary | ICD-10-CM | POA: Diagnosis not present

## 2017-02-25 DIAGNOSIS — E559 Vitamin D deficiency, unspecified: Secondary | ICD-10-CM | POA: Diagnosis not present

## 2017-02-25 DIAGNOSIS — F17200 Nicotine dependence, unspecified, uncomplicated: Secondary | ICD-10-CM | POA: Diagnosis not present

## 2017-02-25 DIAGNOSIS — F419 Anxiety disorder, unspecified: Secondary | ICD-10-CM

## 2017-02-25 DIAGNOSIS — G43101 Migraine with aura, not intractable, with status migrainosus: Secondary | ICD-10-CM | POA: Diagnosis not present

## 2017-02-25 DIAGNOSIS — Z8659 Personal history of other mental and behavioral disorders: Secondary | ICD-10-CM

## 2017-02-25 MED ORDER — VERAPAMIL HCL ER 120 MG PO TBCR: 120.0000 mg | EXTENDED_RELEASE_TABLET | Freq: Every day | ORAL | 1 refills | Status: DC

## 2017-02-25 MED ORDER — SILDENAFIL CITRATE 100 MG PO TABS: 100.0000 mg | ORAL_TABLET | ORAL | 1 refills | Status: DC | PRN

## 2017-02-25 MED ORDER — VITAMIN D (ERGOCALCIFEROL) 1.25 MG (50000 UNIT) PO CAPS: 50000.0000 [IU] | ORAL_CAPSULE | ORAL | 10 refills | Status: DC

## 2017-02-25 MED ORDER — BUPROPION HCL ER (XL) 300 MG PO TB24: 300.0000 mg | ORAL_TABLET | Freq: Every day | ORAL | 1 refills | Status: DC

## 2017-02-25 MED ORDER — LOSARTAN POTASSIUM-HCTZ 100-25 MG PO TABS: 1.0000 | ORAL_TABLET | Freq: Every day | ORAL | 3 refills | Status: DC

## 2017-02-25 NOTE — Progress Notes (Signed)
Impression and Recommendations:    1. Essential hypertension   2. h/o Tobacco use disorder- approximately 28-pack-year history   3. History of panic attacks   4. Adjustment disorder with mixed anxiety and depressed mood- since teenager   5. Anxious mood   6. Erectile dysfunction, unspecified erectile dysfunction type   7. HTN, goal below 130/80   8. Migraine with aura and with status migrainosus, not intractable   9. Encounter for counseling for tobacco use disorder   10. Vitamin D insufficiency     Education and routine counseling performed. Handouts provided.  Orders Placed This Encounter  Procedures  . Comprehensive metabolic panel  . VITAMIN D 25 Hydroxy (Vit-D Deficiency, Fractures)   HTN - Recommended medication modification to better manage his HTN. Prescribed combination pill of HCTZ and losartan. These medicines do work synergistically, and the nature of this helpful interaction was discussed at length with the patient. The usefulness of polypharmacy with HTN was reviewed in detail.  - Patient agrees to check his blood pressure and BRING HIS BP LOG in to next appointment.  - Patient knows to return sooner with any concerns for his medication changes.  - Information provided on low salt DASH diet, to help further control his blood pressure.  Migraines - Patient will continue on medication as prescribed, as his migraines are well managed and have subsided lately.  Erectile Dysfunction - Will prescribe the patient viagra today.  - Potential side-effects reviewed and education provided on viagra today, especially regarding its effects on the vascular system. Reading materials given to the patient to take home.  - If something goes wrong with his blood pressure or otherwise, he knows to come in immediately with any concerns.  Mood - Patient will continue on Wellbutrin as prescribed.  - Patient knows to let us know if he notices any exacerbation of his  anxiety.  Vitamin D Insufficiency - Patient should up his Vitamin D intake.   - Weekly Vitamin D supplement prescribed. We will re-check his vitamin D after he's been on the supplement for a while.  Labs & Follow-up - In 3-4 months approximately, patient will return to obtain CMP and Vitamin D, to check on his Vitamin D insufficiency and blood pressure management.  - Labs should be obtained at least 1 week prior to his follow up appointment.  - Patient would like medication refills today.   Gross side effects, risk and benefits, and alternatives of medications and treatment plan in general discussed with patient.  Patient is aware that all medications have potential side effects and we are unable to predict every side effect or drug-drug interaction that may occur.   Patient will call with any questions prior to using medication if they have concerns.  Expresses verbal understanding and consents to current therapy and treatment regimen.  No barriers to understanding were identified.  Red flag symptoms and signs discussed in detail.  Patient expressed understanding regarding what to do in case of emergency\urgent symptoms  Please see AVS handed out to patient at the end of our visit for further patient instructions/ counseling done pertaining to today's office visit.   Return for 3-4 mo; come in for bldwrk 2-3 d prior to ov.     Note: This document was prepared using Dragon voice recognition software and may include unintentional dictation errors.  This document serves as a record of services personally performed by Mellody Dance, DO. It was created on her behalf by Belenda Cruise  Adah Perl, a trained medical scribe. The creation of this record is based on the scribe's personal observations and the provider's statements to them.   I have reviewed the above medical documentation for accuracy and completeness and I concur.  Mellody Dance 03/02/17 8:04  AM   --------------------------------------------------------------------------------------------------------------------------------------------------------------------------------------------------------------------------------------------   Subjective:    CC:  Chief Complaint  Patient presents with  . Follow-up    HPI: Paul Schroeder is a 47 y.o. male who presents to [place] [facility] today for follow-up of mood.   Overall states that his recent holidays went well.  He confirms doing well in general.  1. HTN HPI:  -  His blood pressure has not been controlled at home. He forgot to bring in his blood pressure log today, but has been watching the average, which has been 145/93.  - Patient reports good compliance with blood pressure medications. He is only on two blood pressure medications.  - Denies medication S-E   - Smoking Status noted - not smoking. Completely quit smoking cigarettes in September. Still doing some vaping, once or twice a day. Notes that he hasn't even craved that today.  - He denies new onset of: chest pain, exercise intolerance, shortness of breath, dizziness, visual changes, headache, lower extremity swelling or claudication.   Last 3 blood pressure readings in our office are as follows: BP Readings from Last 3 Encounters:  02/25/17 (!) 145/88  12/03/16 (!) 158/102  10/28/16 (!) 150/98   Migraines Migraines have subsided recently. The last time he took imitrex was once last month, but he has 2 and a half boxes saved. His migraines are down to one occurrence a month.  Erectile Dysfunction His erectile dysfunction is not improving. His main concern with using medication is potential interference with his baclofen.  Notes that, for the most part, his baclofen is working, as long as he doesn't eat really salty foods. He has not been going to physical rehab. He is still doing activities to take care of his back at home.  He  did try Viagra in the past and notes that he couldn't tell if it interfered with his baclofen or his blood pressure.  Mood Reports that his mood has been pretty good. Got through the holidays, but had some run-ins with family members. Too much closeness, for too long a time.   Believes the Wellbutrin is controlling his anxiety well. Denies the need for anything additional for panic attacks. Has not had any panic attacks lately, and would rather stay away from those additional medications.  Filed Weights   02/25/17 1010  Weight: 175 lb 8 oz (79.6 kg)     Depression screen [facility] 2/9 02/25/2017 12/03/2016 08/12/2016  Decreased Interest 1 1 1   Down, Depressed, Hopeless 1 1 1   PHQ - 2 Score 2 2 2   Altered sleeping 2 2 -  Tired, decreased energy 2 2 1   Change in appetite 1 1 0  Feeling bad or failure about yourself  1 0 0  Trouble concentrating 1 1 0  Moving slowly or fidgety/restless 0 0 0  Suicidal thoughts 0 0 0  PHQ-9 Score 9 8 -  Difficult doing work/chores Not difficult at all Not difficult at all Not difficult at all     GAD 7 : Generalized Anxiety Score 02/25/2017 08/12/2016  Nervous, Anxious, on Edge 1 2  Control/stop worrying 1 0  Worry too much - different things 2 0  Trouble relaxing 1 2  Restless 1 0  Easily annoyed or irritable 2 2  Afraid - awful might happen 1 0  Total GAD 7 Score 9 6  Anxiety Difficulty Not difficult at all Not difficult at all     Wt Readings from Last 3 Encounters:  02/25/17 175 lb 8 oz (79.6 kg)  12/03/16 173 lb 12.8 oz (78.8 kg)  10/28/16 177 lb 1.6 oz (80.3 kg)   BP Readings from Last 3 Encounters:  02/25/17 (!) 145/88  12/03/16 (!) 158/102  10/28/16 (!) 150/98   Pulse Readings from Last 3 Encounters:  02/25/17 63  12/03/16 79  10/28/16 (!) 58   BMI Readings from Last 3 Encounters:  02/25/17 25.18 kg/m  12/03/16 24.94 kg/m  10/28/16 25.41 kg/m      Patient Care Team    Relationship Specialty Notifications Start End   Mellody Dance, DO PCP - General Family Medicine  08/12/16   Nori Riis, PA-C Physician Assistant Urology  03/10/15    Comment: REF PA  TO DR Jamal Collin 03-10-15  UNDER DR Cyndra Numbers, MD  General Surgery  03/10/15   Allyn Kenner, MD Consulting Physician Dermatology  08/12/16    Comment: Every 6 months goes for skin screenings due to his history of melanoma.  McKenzie, Candee Furbish, MD Consulting Physician Urology  08/12/16      Patient Active Problem List   Diagnosis Date Noted  . HTN (hypertension) 08/12/2016    Priority: High  . Tobacco use disorder- approximately 28-pack-year history 08/12/2016    Priority: High  . History of panic attacks 08/12/2016    Priority: High  . Adjustment disorder with mixed anxiety and depressed mood- since teenager 08/12/2016    Priority: High  . Gastroesophageal reflux disease 09/10/2016    Priority: Medium  . 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 )  08/12/2016    Priority: Medium  . Pelvic floor dysfunction- treated by urology 04/22/2015    Priority: Medium  . Environmental and seasonal allergies 08/12/2016    Priority: Low  . Seasonal allergic rhinitis 08/12/2016    Priority: Low  . H/O non anemic vitamin B12 deficiency 08/12/2016    Priority: Low  . H/O iron deficiency anemia 08/12/2016    Priority: Low  . Encounter for counseling for tobacco use disorder 08/12/2016    Priority: Low  . Family history of mood disorder- MOM 08/12/2016    Priority: Low  . Erectile dysfunction 02/25/2017  . Vitamin D insufficiency 02/25/2017  . Anxious mood 09/10/2016  . HTN, goal below 130/80 09/10/2016  . 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 Melanoma of skin ([place]) 08/12/2016  . Dysuria 10/07/2015  . Right groin pain 03/05/2015  . Hematospermia 02/22/2015  . Epididymitis, right 02/22/2015    Past Medical history, Surgical history, Family history, Social history, Allergies and Medications have been entered into the  medical record, reviewed and changed as needed.    Current Meds  Medication Sig  . baclofen (LIORESAL) 10 MG tablet Take 0.5 tablets (5 mg total) by mouth 2 (two) times daily.  Marland Kitchen BIOTIN 5000 PO Take by mouth.  Marland Kitchen buPROPion (WELLBUTRIN XL) 300 MG 24 hr tablet Take 1 tablet (300 mg total) by mouth daily.  . fexofenadine (ALLEGRA) 180 MG tablet Take 1 tablet (180 mg total) by mouth daily.  . finasteride (PROSCAR) 5 MG tablet Take 1/4 tablet by mouth daily  . mometasone (NASONEX) 50 MCG/ACT nasal spray Place 2 sprays into the nose  daily.  . montelukast (SINGULAIR) 10 MG tablet Take 1 tablet (10 mg total) by mouth at bedtime.  . Multiple Vitamin (ONE-A-DAY ESSENTIAL PO) Take by mouth.  . Probiotic Product (FORTIFY DAILY PROBIOTIC PO) Take by mouth.  . ranitidine (ZANTAC) 150 MG tablet Take 1 tablet (150 mg total) by mouth 2 (two) times daily.  . SUMAtriptan (IMITREX) 100 MG tablet TAKE 1 TABLET DAILY AS     NEEDED FOR MIGRAINE AT     ONSET OF HEADACHE.  . verapamil (CALAN-SR) 120 MG CR tablet Take 1 tablet (120 mg total) by mouth at bedtime. PATIENT MUST HAVE OFFICE VISIT PRIOR TO ANY FURTHER REFILLS  . [DISCONTINUED] buPROPion (WELLBUTRIN XL) 300 MG 24 hr tablet Take 1 tablet (300 mg total) by mouth daily.  . [DISCONTINUED] finasteride (PROPECIA) 1 MG tablet Take 2 tablets (2 mg total) by mouth daily.  . [DISCONTINUED] losartan (COZAAR) 100 MG tablet Take 1 tablet (100 mg total) by mouth daily.  . [DISCONTINUED] verapamil (CALAN-SR) 120 MG CR tablet Take 1 tablet (120 mg total) by mouth at bedtime. PATIENT MUST HAVE OFFICE VISIT PRIOR TO ANY FURTHER REFILLS    Allergies:  No Known Allergies  Review of Systems: General:   No F/C, wt loss Pulm:   No DIB, SOB, pleuritic chest pain Card:  No CP, palpitations Abd:  No n/v/d or pain Ext:  No inc edema from baseline Psych: no SI/ HI   Objective:   Blood pressure (!) 145/88, pulse 63, height 5\' 10"  (1.778 m), weight 175 lb 8 oz (79.6 kg), SpO2  100 %. Body mass index is 25.18 kg/m. General:  Well Developed, well nourished, appropriate for stated age.  Neuro:  Alert and oriented,  extra-ocular muscles intact  HEENT:  Normocephalic, atraumatic, neck supple, no carotid bruits appreciated  Skin:  no gross rash, warm, pink. Cardiac:  RRR, S1 S2 Respiratory:  ECTA B/L and A/P, Not using accessory muscles, speaking in full sentences- unlabored. Vascular:  Ext warm, no cyanosis apprec.; cap RF less 2 sec. Psych:  No HI/SI, judgement and insight good, Euthymic mood. Full Affect.

## 2017-02-25 NOTE — Patient Instructions (Addendum)
We will follow-up in about 3-4 months for recheck of your blood pressure please bring in a blood pressure log.  1 week prior to the office visit with me please come in for blood work.  This does not have to be fasting.  Please keep your blood pressure and bring a log next time     Check out the DASH diet = 1.5 Gram Low Sodium Diet   A 1.5 gram sodium diet restricts the amount of sodium in the diet to no more than 1.5 g or 1500 mg daily.  The American Heart Association recommends Americans over the age of 43 to consume no more than 1500 mg of sodium each day to reduce the risk of developing high blood pressure.  Research also shows that limiting sodium may reduce heart attack and stroke risk.  Many foods contain sodium for flavor and sometimes as a preservative.  When the amount of sodium in a diet needs to be low, it is important to know what to look for when choosing foods and drinks.  The following includes some information and guidelines to help make it easier for you to adapt to a low sodium diet.    QUICK TIPS  Do not add salt to food.  Avoid convenience items and fast food.  Choose unsalted snack foods.  Buy lower sodium products, often labeled as "lower sodium" or "no salt added."  Check food labels to learn how much sodium is in 1 serving.  When eating at a restaurant, ask that your food be prepared with less salt or none, if possible.    READING FOOD LABELS FOR SODIUM INFORMATION  The nutrition facts label is a good place to find how much sodium is in foods. Look for products with no more than 400 mg of sodium per serving.  Remember that 1.5 g = 1500 mg.  The food label may also list foods as:  Sodium-free: Less than 5 mg in a serving.  Very low sodium: 35 mg or less in a serving.  Low-sodium: 140 mg or less in a serving.  Light in sodium: 50% less sodium in a serving. For example, if a food that usually has 300 mg of sodium is changed to become light in sodium, it will have 150  mg of sodium.  Reduced sodium: 25% less sodium in a serving. For example, if a food that usually has 400 mg of sodium is changed to reduced sodium, it will have 300 mg of sodium.    CHOOSING FOODS  Grains  Avoid: Salted crackers and snack items. Some cereals, including instant hot cereals. Bread stuffing and biscuit mixes. Seasoned rice or pasta mixes.  Choose: Unsalted snack items. Low-sodium cereals, oats, puffed wheat and rice, shredded wheat. English muffins and bread. Pasta.  Meats  Avoid: Salted, canned, smoked, spiced, pickled meats, including fish and poultry. Bacon, ham, sausage, cold cuts, hot dogs, anchovies.  Choose: Low-sodium canned tuna and salmon. Fresh or frozen meat, poultry, and fish.  Dairy  Avoid: Processed cheese and spreads. Cottage cheese. Buttermilk and condensed milk. Regular cheese.  Choose: Milk. Low-sodium cottage cheese. Yogurt. Sour cream. Low-sodium cheese.  Fruits and Vegetables  Avoid: Regular canned vegetables. Regular canned tomato sauce and paste. Frozen vegetables in sauces. Olives. Paul Schroeder. Relishes. Sauerkraut.  Choose: Low-sodium canned vegetables. Low-sodium tomato sauce and paste. Frozen or fresh vegetables. Fresh and frozen fruit.  Condiments  Avoid: Canned and packaged gravies. Worcestershire sauce. Tartar sauce. Barbecue sauce. Soy sauce. Steak sauce. Ketchup.  Onion, garlic, and table salt. Meat flavorings and tenderizers.  Choose: Fresh and dried herbs and spices. Low-sodium varieties of mustard and ketchup. Lemon juice. Tabasco sauce. Horseradish.    SAMPLE 1.5 GRAM SODIUM MEAL PLAN:   Breakfast / Sodium (mg)  1 cup low-fat milk / 143 mg  1 whole-wheat English muffin / 240 mg  1 tbs heart-healthy margarine / 153 mg  1 hard-boiled egg / 139 mg  1 small orange / 0 mg  Lunch / Sodium (mg)  1 cup raw carrots / 76 mg  2 tbs no salt added peanut butter / 5 mg  2 slices whole-wheat bread / 270 mg  1 tbs jelly / 6 mg   cup red grapes / 2 mg   Dinner / Sodium (mg)  1 cup whole-wheat pasta / 2 mg  1 cup low-sodium tomato sauce / 73 mg  3 oz lean ground beef / 57 mg  1 small side salad (1 cup raw spinach leaves,  cup cucumber,  cup yellow bell pepper) with 1 tsp olive oil and 1 tsp red wine vinegar / 25 mg  Snack / Sodium (mg)  1 container low-fat vanilla yogurt / 107 mg  3 graham cracker squares / 127 mg  Nutrient Analysis  Calories: 1745  Protein: 75 g  Carbohydrate: 237 g  Fat: 57 g  Sodium: 1425 mg  Document Released: 01/25/2005 Document Revised: 10/07/2010 Document Reviewed: 04/28/2009  [facility] Patient Information 2012 [place], [place].

## 2017-03-05 ENCOUNTER — Other Ambulatory Visit: Payer: Self-pay | Admitting: Family Medicine

## 2017-03-05 DIAGNOSIS — J302 Other seasonal allergic rhinitis: Secondary | ICD-10-CM

## 2017-04-24 IMAGING — CT CT RENAL STONE PROTOCOL
1 of 2 series · 15 of 32 positions shown, 19 images · non-contrast
Comparison: None.

CLINICAL DATA: Right lower quadrant pain radiating to right groin
for 2 weeks. Hematocrit hernia. Microscopic hematuria and dysuria.
Personal history of melanoma.

EXAM:
CT ABDOMEN AND PELVIS WITHOUT CONTRAST
TECHNIQUE: Multidetector CT imaging of the abdomen and pelvis was performed
following the standard protocol without IV contrast.

[Series 2: axial st · axial · 0.70mm/px · z∈[-1005,-575]mm · 15 of 94 slices shown, 19 images]
[im 4/94  soft-tissue]
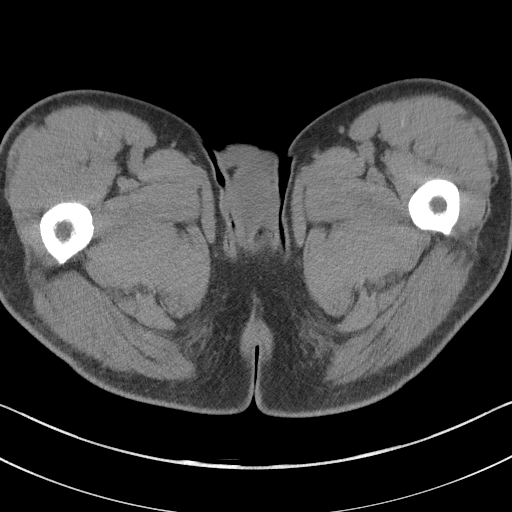
[im 4/94  bone]
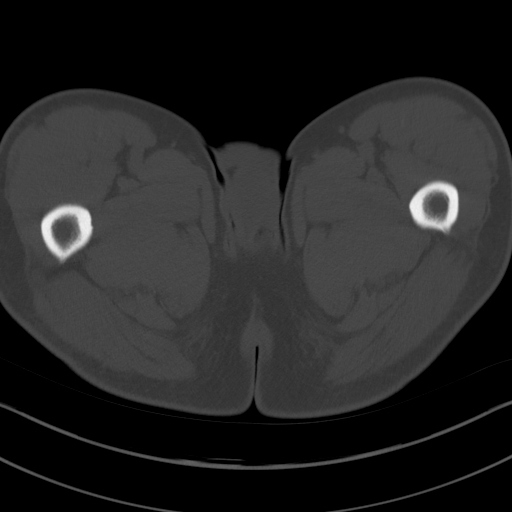
[im 11/94  soft-tissue]
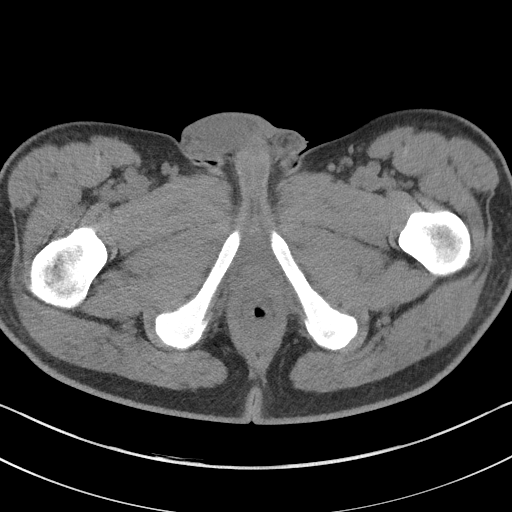
[im 18/94  soft-tissue]
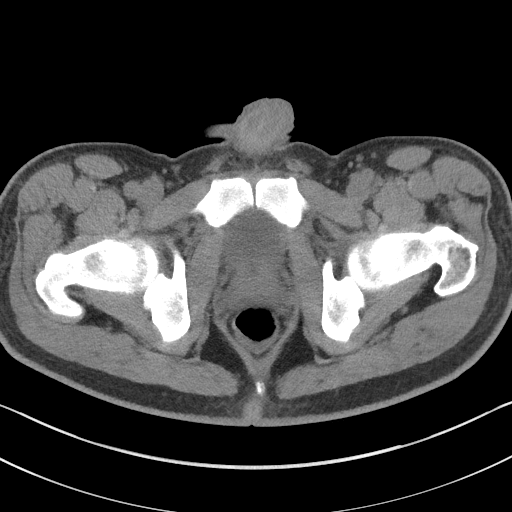
[im 26/94  soft-tissue]
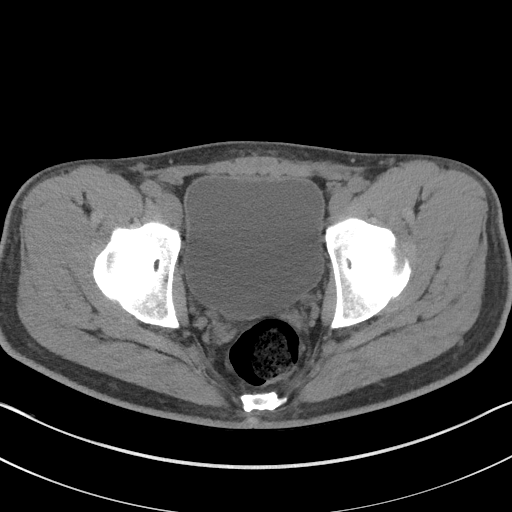
[im 33/94  soft-tissue]
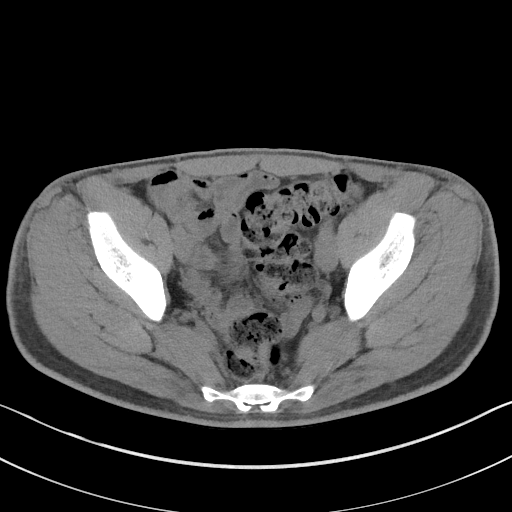
[im 40/94  soft-tissue]
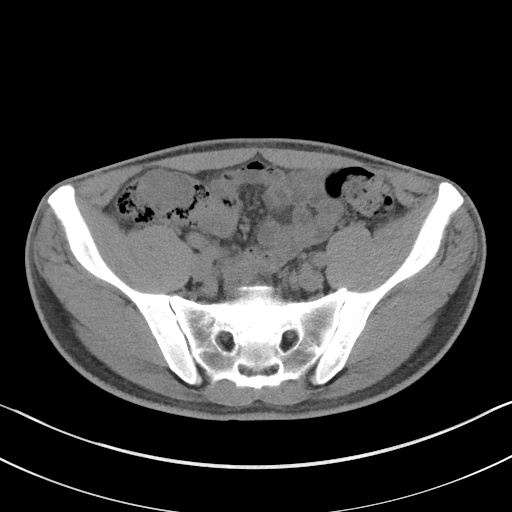
[im 47/94  soft-tissue]
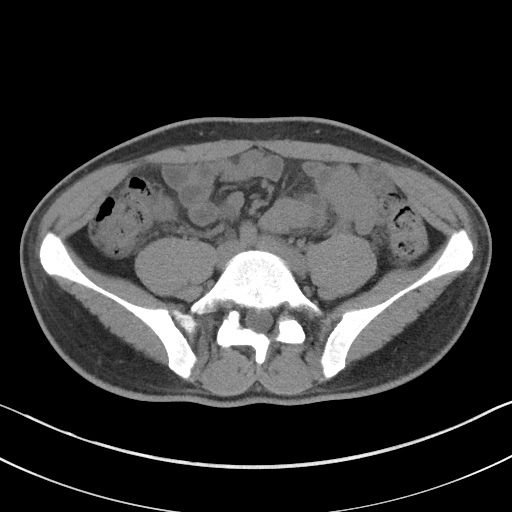
[im 54/94  soft-tissue]
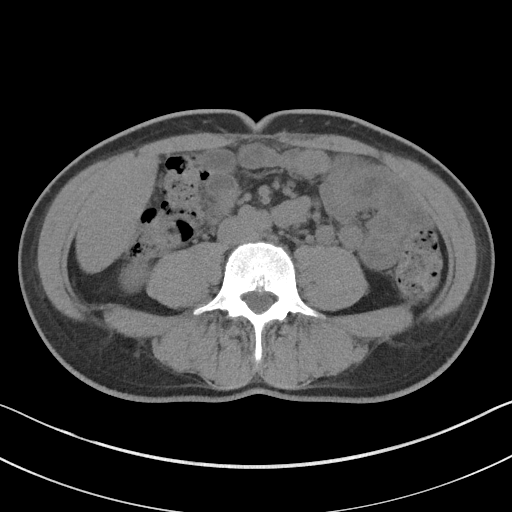
[im 61/94  soft-tissue]
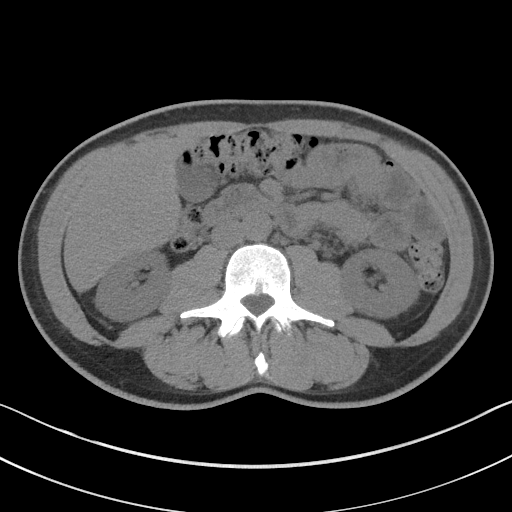
[im 61/94  bone]
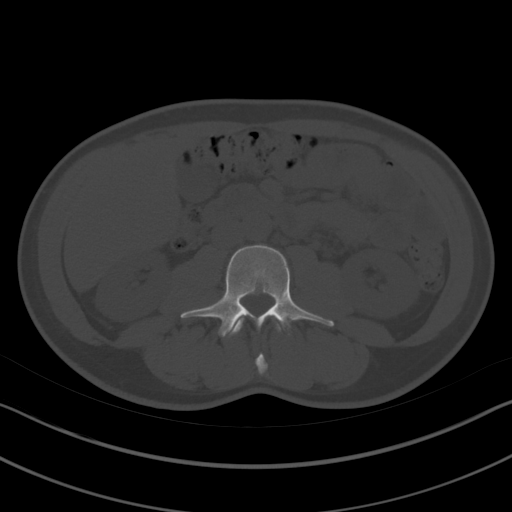
[im 68/94  soft-tissue]
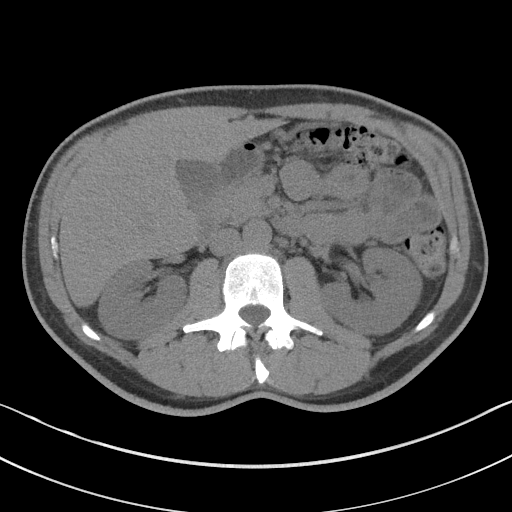
[im 76/94  soft-tissue]
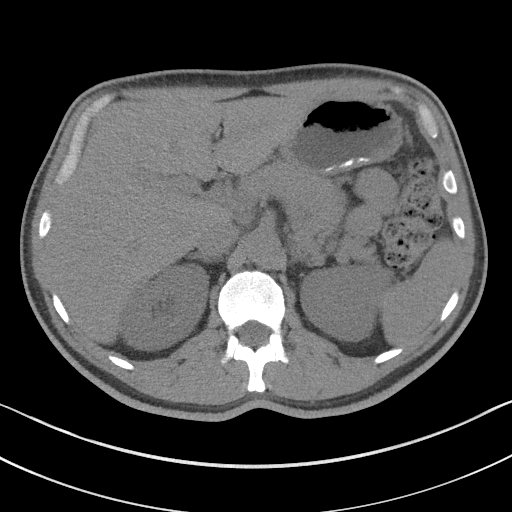
[im 79/94  lung]
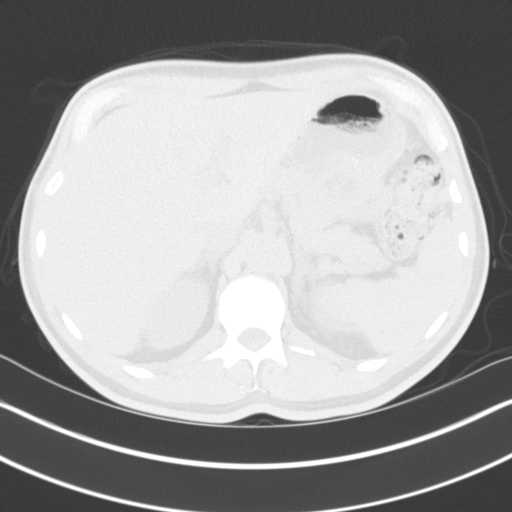
[im 83/94  soft-tissue]
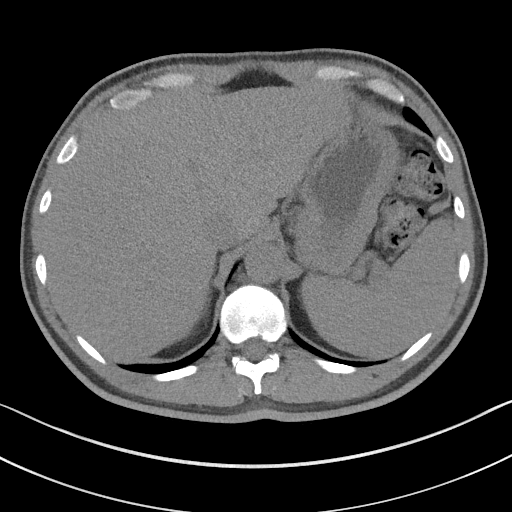
[im 83/94  lung]
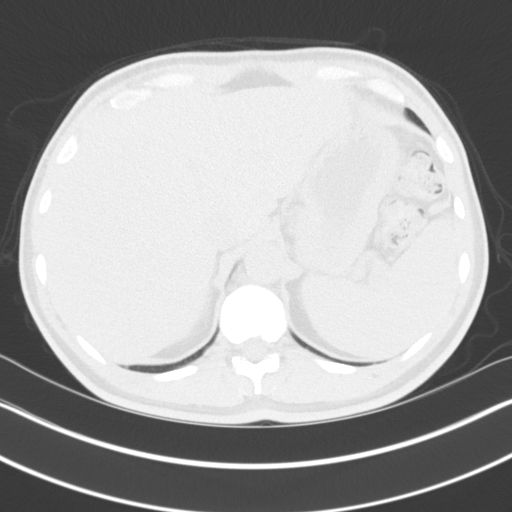
[im 86/94  lung]
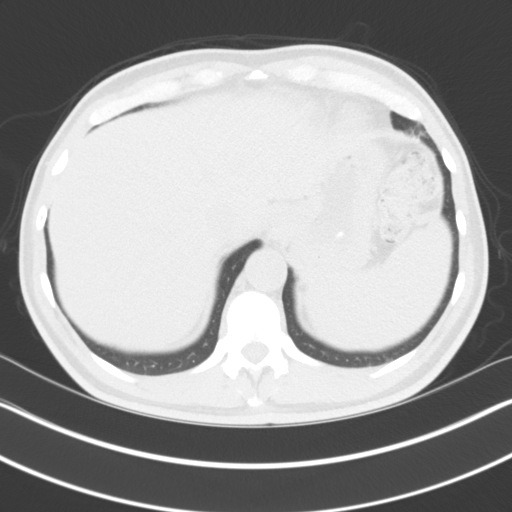
[im 90/94  soft-tissue]
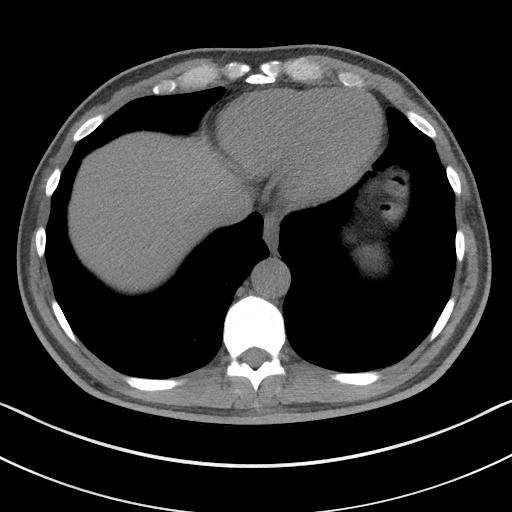
[im 90/94  lung]
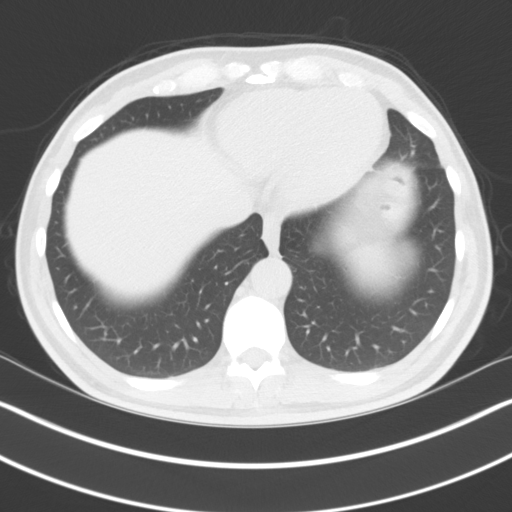

[15 of 32 positions shown; findings below may reference images not displayed]

FINDINGS: Lower chest:  No acute findings.

Hepatobiliary: No mass visualized on this un-enhanced exam.
Gallbladder is unremarkable.

Pancreas: No mass or inflammatory process identified on this
un-enhanced exam.

Spleen: Within normal limits in size.

Adrenals/Urinary Tract: No evidence of urolithiasis or
hydronephrosis. No definite mass visualized on this un-enhanced
exam.

Stomach/Bowel: No evidence of obstruction, inflammatory process, or
abnormal fluid collections.

Vascular/Lymphatic: No pathologically enlarged lymph nodes. No
evidence of abdominal aortic aneurysm.

Reproductive: No mass or other significant abnormality.

Other: No evidence of inguinal hernia or mass.

Musculoskeletal:  No suspicious bone lesions identified.
IMPRESSION: No evidence of urolithiasis, hydronephrosis, or other acute
findings.

## 2017-05-09 ENCOUNTER — Other Ambulatory Visit: Payer: Self-pay | Admitting: Family Medicine

## 2017-05-09 DIAGNOSIS — F17200 Nicotine dependence, unspecified, uncomplicated: Secondary | ICD-10-CM

## 2017-05-09 DIAGNOSIS — F419 Anxiety disorder, unspecified: Secondary | ICD-10-CM

## 2017-05-09 DIAGNOSIS — Z716 Tobacco abuse counseling: Secondary | ICD-10-CM

## 2017-05-13 IMAGING — CT CT ABD-PELV W/ CM
1 of 3 series · 14 of 32 positions shown, 19 images · IV contrast (APPLIED)
Comparison: 02/27/2015 CT abdomen/ pelvis.

CLINICAL DATA: Right lower quadrant abdominal pain intermittently
for 1.5 months.

EXAM:
CT ABDOMEN AND PELVIS WITH CONTRAST
TECHNIQUE: Multidetector CT imaging of the abdomen and pelvis was performed
using the standard protocol following bolus administration of
intravenous contrast.
CONTRAST:  100mL OMNIPAQUE IOHEXOL 350 MG/ML SOLN

[Series 2: axial st · axial · 0.72mm/px · z∈[-1025,-625]mm · 14 of 92 slices shown, 19 images]
[im 6/92  soft-tissue]
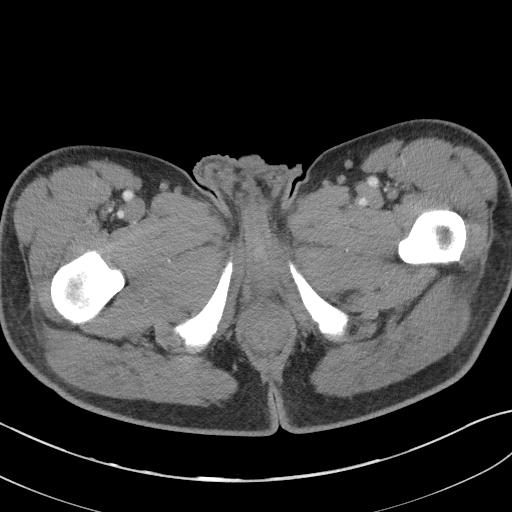
[im 6/92  bone]
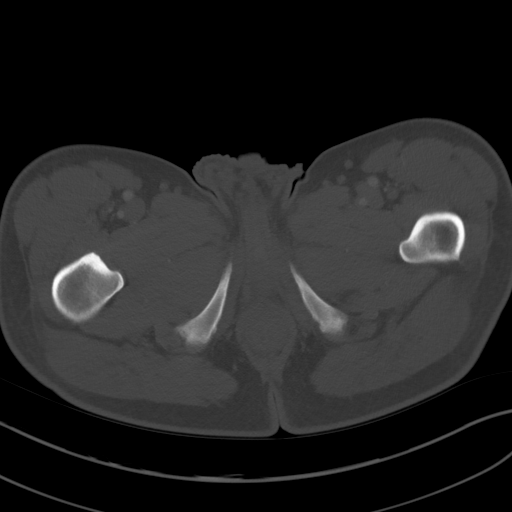
[im 11/92  soft-tissue]
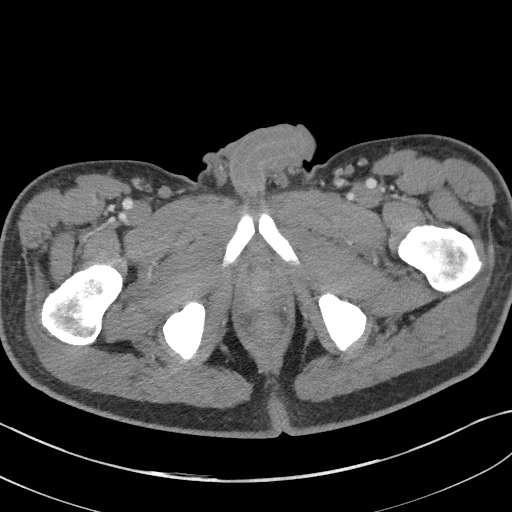
[im 21/92  soft-tissue]
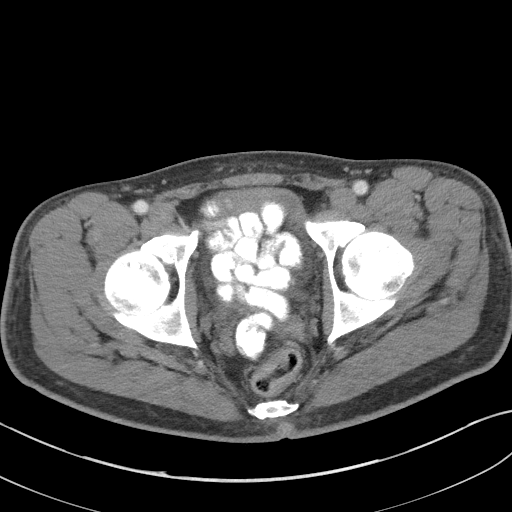
[im 26/92  soft-tissue]
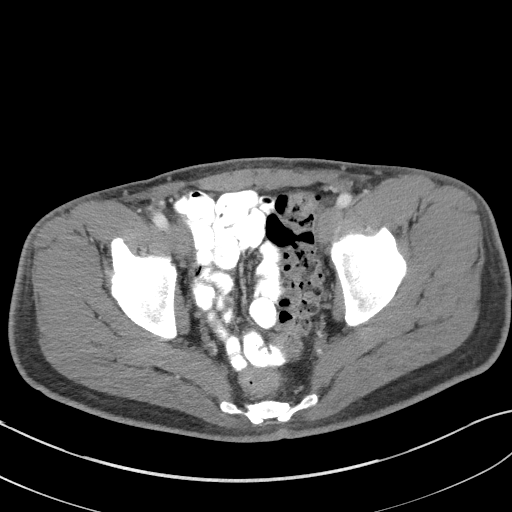
[im 31/92  soft-tissue]
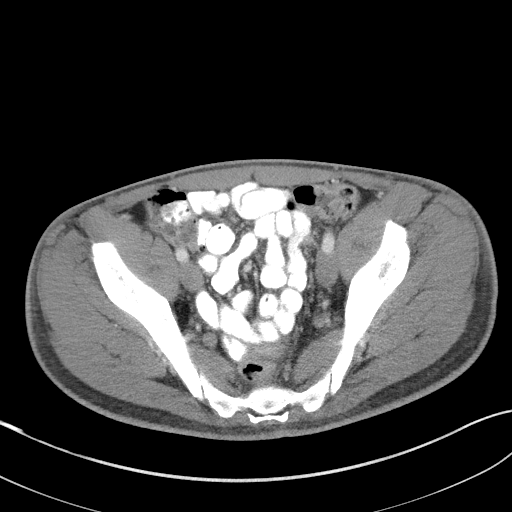
[im 41/92  soft-tissue]
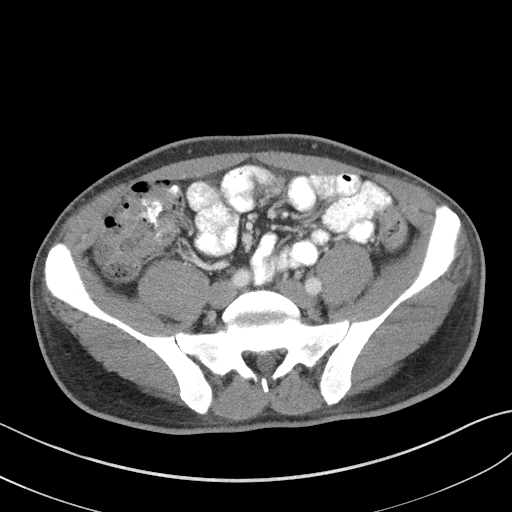
[im 46/92  soft-tissue]
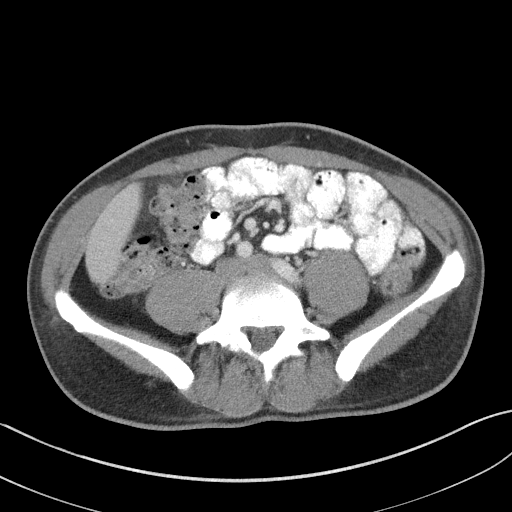
[im 51/92  soft-tissue]
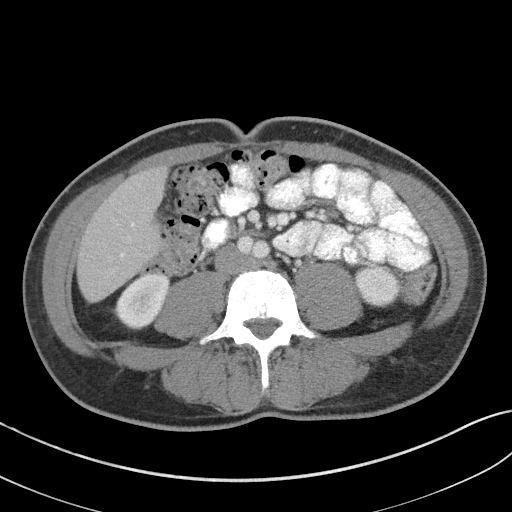
[im 61/92  soft-tissue]
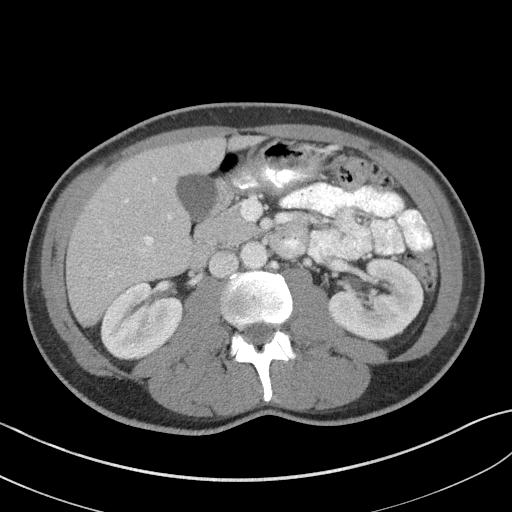
[im 61/92  bone]
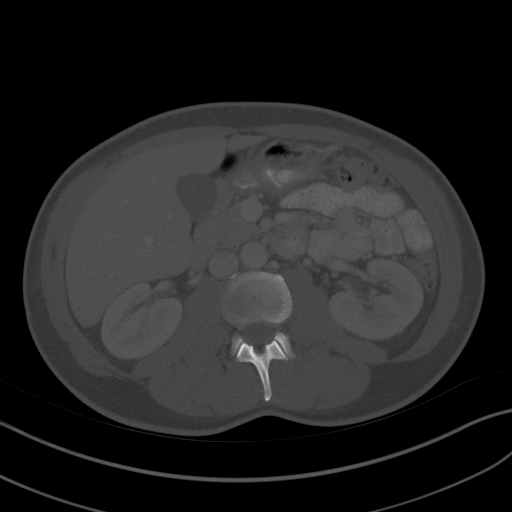
[im 66/92  soft-tissue]
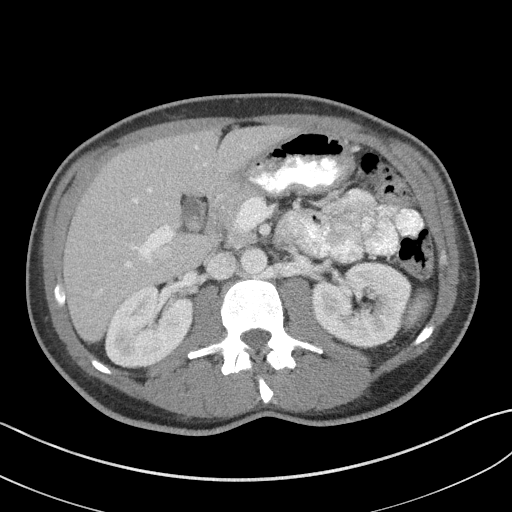
[im 71/92  soft-tissue]
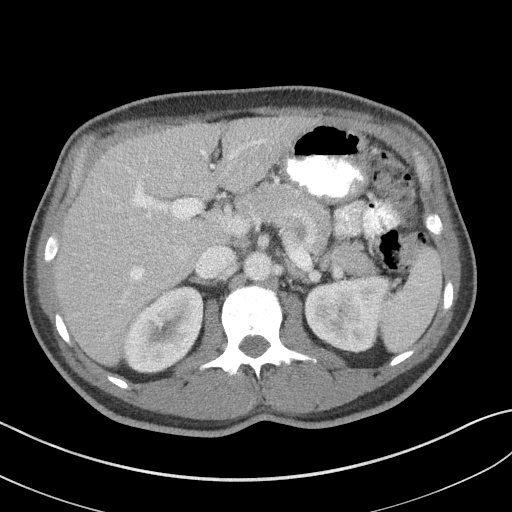
[im 71/92  lung]
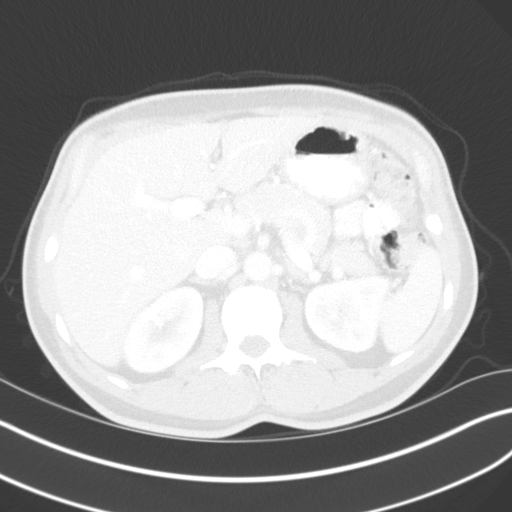
[im 76/92  lung]
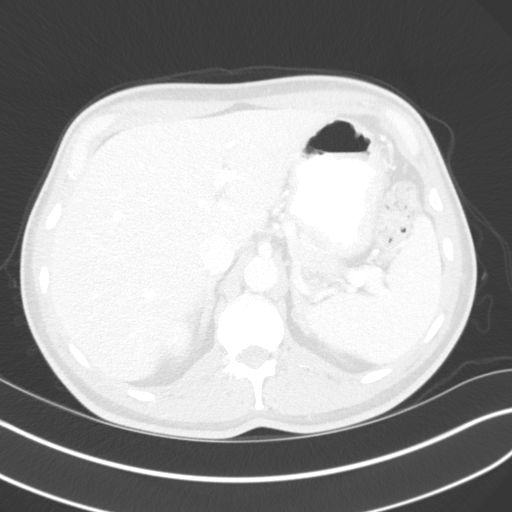
[im 81/92  soft-tissue]
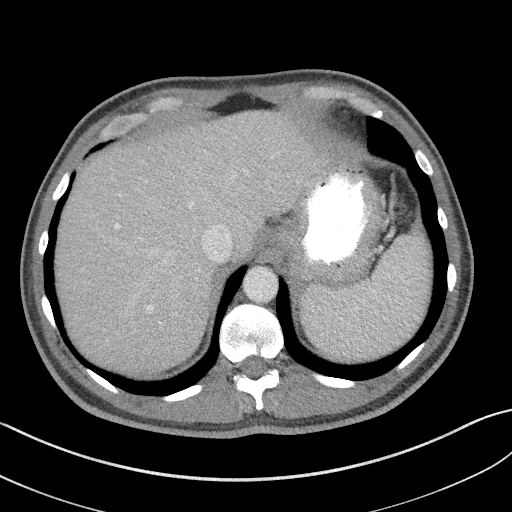
[im 81/92  lung]
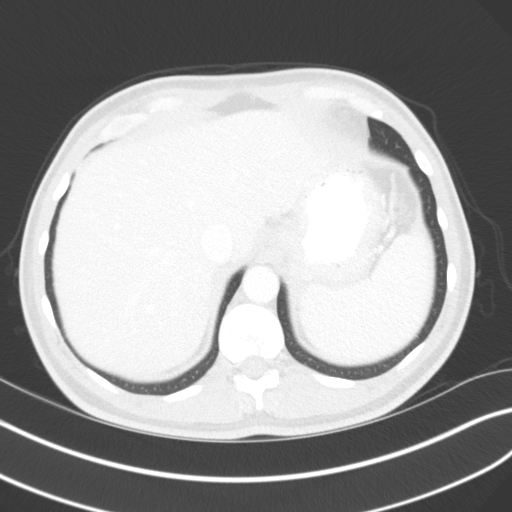
[im 86/92  soft-tissue]
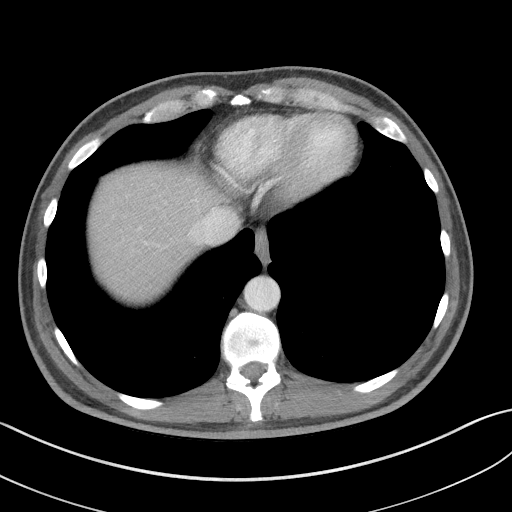
[im 86/92  lung]
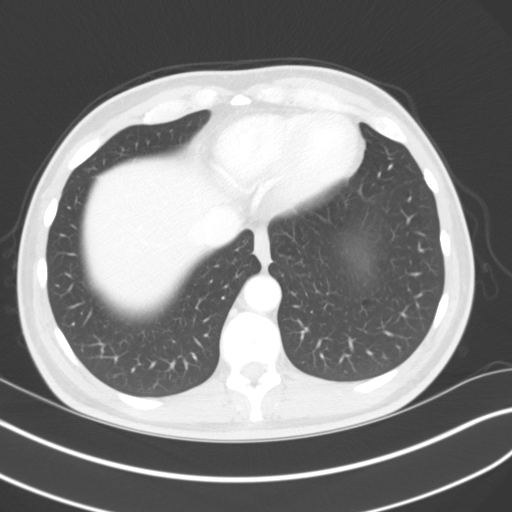

[14 of 32 positions shown; findings below may reference images not displayed]

FINDINGS: Lower chest: No significant pulmonary nodules or acute consolidative
airspace disease.

Hepatobiliary: Normal liver with no liver mass. Normal gallbladder
with no radiopaque cholelithiasis. No biliary ductal dilatation.

Pancreas: Normal, with no mass or duct dilation.

Spleen: Normal size. No mass.

Adrenals/Urinary Tract: Normal adrenals. Normal kidneys with no
hydronephrosis and no renal mass. Normal bladder.

Stomach/Bowel: Grossly normal stomach. Normal caliber small bowel
with no small bowel wall thickening. Normal appendix . Normal large
bowel with no diverticulosis, large bowel wall thickening or
pericolonic fat stranding.

Vascular/Lymphatic: Normal caliber abdominal aorta. Patent portal,
splenic, hepatic and renal veins. Note is made of a circumaortic
left renal vein. No pathologically enlarged lymph nodes in the
abdomen or pelvis.

Reproductive: Normal size prostate and seminal vesicles.

Other: No pneumoperitoneum, ascites or focal fluid collection.

Musculoskeletal: No aggressive appearing focal osseous lesions.
Minimal spondylosis in the lumbar spine. No evidence of a ventral or
inguinal hernia.
IMPRESSION: No CT findings to explain the patient's presenting symptoms. No
evidence of bowel obstruction or acute bowel inflammation. Normal
appendix.

## 2017-06-08 ENCOUNTER — Other Ambulatory Visit: Payer: Self-pay | Admitting: Family Medicine

## 2017-06-08 DIAGNOSIS — R1031 Right lower quadrant pain: Secondary | ICD-10-CM

## 2017-06-08 DIAGNOSIS — M6289 Other specified disorders of muscle: Secondary | ICD-10-CM

## 2017-06-17 ENCOUNTER — Other Ambulatory Visit: Payer: 59

## 2017-06-24 ENCOUNTER — Ambulatory Visit: Payer: 59 | Admitting: Family Medicine

## 2017-07-11 ENCOUNTER — Other Ambulatory Visit: Payer: 59

## 2017-07-11 DIAGNOSIS — I10 Essential (primary) hypertension: Secondary | ICD-10-CM

## 2017-07-11 DIAGNOSIS — E559 Vitamin D deficiency, unspecified: Secondary | ICD-10-CM

## 2017-07-12 LAB — COMPREHENSIVE METABOLIC PANEL
A/G RATIO: 2.3 — AB (ref 1.2–2.2)
ALT: 20 IU/L (ref 0–44)
AST: 15 IU/L (ref 0–40)
Albumin: 4.8 g/dL (ref 3.5–5.5)
Alkaline Phosphatase: 61 IU/L (ref 39–117)
BUN/Creatinine Ratio: 14 (ref 9–20)
BUN: 13 mg/dL (ref 6–24)
Bilirubin Total: 0.5 mg/dL (ref 0.0–1.2)
CALCIUM: 10 mg/dL (ref 8.7–10.2)
CHLORIDE: 102 mmol/L (ref 96–106)
CO2: 22 mmol/L (ref 20–29)
Creatinine, Ser: 0.96 mg/dL (ref 0.76–1.27)
GFR calc Af Amer: 108 mL/min/{1.73_m2} (ref >59)
GFR, EST NON AFRICAN AMERICAN: 94 mL/min/{1.73_m2} (ref >59)
Globulin, Total: 2.1 g/dL (ref 1.5–4.5)
Glucose: 80 mg/dL (ref 65–99)
POTASSIUM: 3.6 mmol/L (ref 3.5–5.2)
Sodium: 145 mmol/L — ABNORMAL HIGH (ref 134–144)
Total Protein: 6.9 g/dL (ref 6.0–8.5)

## 2017-07-12 LAB — VITAMIN D 25 HYDROXY (VIT D DEFICIENCY, FRACTURES): Vit D, 25-Hydroxy: 48.1 ng/mL (ref 30.0–100.0)

## 2017-07-13 ENCOUNTER — Encounter: Payer: Self-pay | Admitting: Family Medicine

## 2017-07-13 ENCOUNTER — Ambulatory Visit (INDEPENDENT_AMBULATORY_CARE_PROVIDER_SITE_OTHER): Payer: 59 | Admitting: Family Medicine

## 2017-07-13 VITALS — BP 133/94 | HR 67 | Ht 70.0 in | Wt 178.0 lb

## 2017-07-13 DIAGNOSIS — Z719 Counseling, unspecified: Secondary | ICD-10-CM

## 2017-07-13 DIAGNOSIS — I10 Essential (primary) hypertension: Secondary | ICD-10-CM | POA: Diagnosis not present

## 2017-07-13 DIAGNOSIS — F4323 Adjustment disorder with mixed anxiety and depressed mood: Secondary | ICD-10-CM | POA: Diagnosis not present

## 2017-07-13 DIAGNOSIS — Z5181 Encounter for therapeutic drug level monitoring: Secondary | ICD-10-CM | POA: Diagnosis not present

## 2017-07-13 DIAGNOSIS — E559 Vitamin D deficiency, unspecified: Secondary | ICD-10-CM | POA: Diagnosis not present

## 2017-07-13 DIAGNOSIS — G43809 Other migraine, not intractable, without status migrainosus: Secondary | ICD-10-CM | POA: Diagnosis not present

## 2017-07-13 DIAGNOSIS — N529 Male erectile dysfunction, unspecified: Secondary | ICD-10-CM

## 2017-07-13 DIAGNOSIS — Z83438 Family history of other disorder of lipoprotein metabolism and other lipidemia: Secondary | ICD-10-CM | POA: Diagnosis not present

## 2017-07-13 MED ORDER — SUMATRIPTAN SUCCINATE 100 MG PO TABS: 100.0000 mg | ORAL_TABLET | ORAL | 0 refills | Status: DC | PRN

## 2017-07-13 MED ORDER — VERAPAMIL HCL ER 240 MG PO TBCR: 240.0000 mg | EXTENDED_RELEASE_TABLET | Freq: Every day | ORAL | 3 refills | Status: DC

## 2017-07-13 NOTE — Patient Instructions (Signed)
Check out the DASH diet = 1.5 Gram Low Sodium Diet   A 1.5 gram sodium diet restricts the amount of sodium in the diet to no more than 1.5 g or 1500 mg daily.  The American Heart Association recommends Americans over the age of 47 to consume no more than 1500 mg of sodium each day to reduce the risk of developing high blood pressure.  Research also shows that limiting sodium may reduce heart attack and stroke risk.  Many foods contain sodium for flavor and sometimes as a preservative.  When the amount of sodium in a diet needs to be low, it is important to know what to look for when choosing foods and drinks.  The following includes some information and guidelines to help make it easier for you to adapt to a low sodium diet.    QUICK TIPS  Do not add salt to food.  Avoid convenience items and fast food.  Choose unsalted snack foods.  Buy lower sodium products, often labeled as "lower sodium" or "no salt added."  Check food labels to learn how much sodium is in 1 serving.  When eating at a restaurant, ask that your food be prepared with less salt or none, if possible.    READING FOOD LABELS FOR SODIUM INFORMATION  The nutrition facts label is a good place to find how much sodium is in foods. Look for products with no more than 400 mg of sodium per serving.  Remember that 1.5 g = 1500 mg.  The food label may also list foods as:  Sodium-free: Less than 5 mg in a serving.  Very low sodium: 35 mg or less in a serving.  Low-sodium: 140 mg or less in a serving.  Light in sodium: 50% less sodium in a serving. For example, if a food that usually has 300 mg of sodium is changed to become light in sodium, it will have 150 mg of sodium.  Reduced sodium: 25% less sodium in a serving. For example, if a food that usually has 400 mg of sodium is changed to reduced sodium, it will have 300 mg of sodium.    CHOOSING FOODS  Grains  Avoid: Salted crackers and snack items. Some cereals, including instant  hot cereals. Bread stuffing and biscuit mixes. Seasoned rice or pasta mixes.  Choose: Unsalted snack items. Low-sodium cereals, oats, puffed wheat and rice, shredded wheat. English muffins and bread. Pasta.  Meats  Avoid: Salted, canned, smoked, spiced, pickled meats, including fish and poultry. Bacon, ham, sausage, cold cuts, hot dogs, anchovies.  Choose: Low-sodium canned tuna and salmon. Fresh or frozen meat, poultry, and fish.  Dairy  Avoid: Processed cheese and spreads. Cottage cheese. Buttermilk and condensed milk. Regular cheese.  Choose: Milk. Low-sodium cottage cheese. Yogurt. Sour cream. Low-sodium cheese.  Fruits and Vegetables  Avoid: Regular canned vegetables. Regular canned tomato sauce and paste. Frozen vegetables in sauces. Olives. Paul Schroeder. Relishes. Sauerkraut.  Choose: Low-sodium canned vegetables. Low-sodium tomato sauce and paste. Frozen or fresh vegetables. Fresh and frozen fruit.  Condiments  Avoid: Canned and packaged gravies. Worcestershire sauce. Tartar sauce. Barbecue sauce. Soy sauce. Steak sauce. Ketchup. Onion, garlic, and table salt. Meat flavorings and tenderizers.  Choose: Fresh and dried herbs and spices. Low-sodium varieties of mustard and ketchup. Lemon juice. Tabasco sauce. Horseradish.    SAMPLE 1.5 GRAM SODIUM MEAL PLAN:   Breakfast / Sodium (mg)  1 cup low-fat milk / 143 mg  1 whole-wheat English muffin / 240 mg  1 tbs heart-healthy margarine / 153 mg  1 hard-boiled egg / 139 mg  1 small orange / 0 mg  Lunch / Sodium (mg)  1 cup raw carrots / 76 mg  2 tbs no salt added peanut butter / 5 mg  2 slices whole-wheat bread / 270 mg  1 tbs jelly / 6 mg   cup red grapes / 2 mg  Dinner / Sodium (mg)  1 cup whole-wheat pasta / 2 mg  1 cup low-sodium tomato sauce / 73 mg  3 oz lean ground beef / 57 mg  1 small side salad (1 cup raw spinach leaves,  cup cucumber,  cup yellow bell pepper) with 1 tsp olive oil and 1 tsp red wine vinegar / 25 mg  Snack /  Sodium (mg)  1 container low-fat vanilla yogurt / 107 mg  3 graham cracker squares / 127 mg  Nutrient Analysis  Calories: 1745  Protein: 75 g  Carbohydrate: 237 g  Fat: 57 g  Sodium: 1425 mg  Document Released: 01/25/2005 Document Revised: 10/07/2010 Document Reviewed: 04/28/2009  Paul Schroeder Patient Information 2012 [place], [place].      Hypertension Hypertension, commonly called high blood pressure, is when the force of blood pumping through the arteries is too strong. The arteries are the blood vessels that carry blood from the heart throughout the body. Hypertension forces the heart to work harder to pump blood and may cause arteries to become narrow or stiff. Having untreated or uncontrolled hypertension can cause heart attacks, strokes, kidney disease, and other problems. A blood pressure reading consists of a higher number over a lower number. Ideally, your blood pressure should be below 120/80. The first ("top") number is called the systolic pressure. It is a measure of the pressure in your arteries as your heart beats. The second ("bottom") number is called the diastolic pressure. It is a measure of the pressure in your arteries as the heart relaxes. What are the causes? The cause of this condition is not known. What increases the risk? Some risk factors for high blood pressure are under your control. Others are not. Factors you can change  Smoking.  Having type 2 diabetes mellitus, high cholesterol, or both.  Not getting enough exercise or physical activity.  Being overweight.  Having too much fat, sugar, calories, or salt (sodium) in your diet.  Drinking too much alcohol. Factors that are difficult or impossible to change  Having chronic kidney disease.  Having a family history of high blood pressure.  Age. Risk increases with age.  Race. You may be at higher risk if you are African-American.  Gender. Men are at higher risk than women before age 75. After age 52,  women are at higher risk than men.  Having obstructive sleep apnea.  Stress. What are the signs or symptoms? Extremely high blood pressure (hypertensive crisis) may cause:  Headache.  Anxiety.  Shortness of breath.  Nosebleed.  Nausea and vomiting.  Severe chest pain.  Jerky movements you cannot control (seizures).  How is this diagnosed? This condition is diagnosed by measuring your blood pressure while you are seated, with your arm resting on a surface. The cuff of the blood pressure monitor will be placed directly against the skin of your upper arm at the level of your heart. It should be measured at least twice using the same arm. Certain conditions can cause a difference in blood pressure between your right and left arms. Certain factors can cause blood pressure readings  to be lower or higher than normal (elevated) for a short period of time:  When your blood pressure is higher when you are in a health care provider's office than when you are at home, this is called white coat hypertension. Most people with this condition do not need medicines.  When your blood pressure is higher at home than when you are in a health care provider's office, this is called masked hypertension. Most people with this condition may need medicines to control blood pressure.  If you have a high blood pressure reading during one visit or you have normal blood pressure with other risk factors:  You may be asked to return on a different day to have your blood pressure checked again.  You may be asked to monitor your blood pressure at home for 1 week or longer.  If you are diagnosed with hypertension, you may have other blood or imaging tests to help your health care provider understand your overall risk for other conditions. How is this treated? This condition is treated by making healthy lifestyle changes, such as eating healthy foods, exercising more, and reducing your alcohol intake. Your health  care provider may prescribe medicine if lifestyle changes are not enough to get your blood pressure under control, and if:  Your systolic blood pressure is above 130.  Your diastolic blood pressure is above 80.  Your personal target blood pressure may vary depending on your medical conditions, your age, and other factors. Follow these instructions at home: Eating and drinking  Eat a diet that is high in fiber and potassium, and low in sodium, added sugar, and fat. An example eating plan is called the DASH (Dietary Approaches to Stop Hypertension) diet. To eat this way: ? Eat plenty of fresh fruits and vegetables. Try to fill half of your plate at each meal with fruits and vegetables. ? Eat whole grains, such as whole wheat pasta, brown rice, or whole grain bread. Fill about one quarter of your plate with whole grains. ? Eat or drink low-fat dairy products, such as skim milk or low-fat yogurt. ? Avoid fatty cuts of meat, processed or cured meats, and poultry with skin. Fill about one quarter of your plate with lean proteins, such as fish, chicken without skin, beans, eggs, and tofu. ? Avoid premade and processed foods. These tend to be higher in sodium, added sugar, and fat.  Reduce your daily sodium intake. Most people with hypertension should eat less than 1,500 mg of sodium a day.  Limit alcohol intake to no more than 1 drink a day for nonpregnant women and 2 drinks a day for men. One drink equals 12 oz of beer, 5 oz of wine, or 1 oz of hard liquor. Lifestyle  Work with your health care provider to maintain a healthy body weight or to lose weight. Ask what an ideal weight is for you.  Get at least 30 minutes of exercise that causes your heart to beat faster (aerobic exercise) most days of the week. Activities may include walking, swimming, or biking.  Include exercise to strengthen your muscles (resistance exercise), such as pilates or lifting weights, as part of your weekly exercise  routine. Try to do these types of exercises for 30 minutes at least 3 days a week.  Do not use any products that contain nicotine or tobacco, such as cigarettes and e-cigarettes. If you need help quitting, ask your health care provider.  Monitor your blood pressure at home as told by  your health care provider.  Keep all follow-up visits as told by your health care provider. This is important. Medicines  Take over-the-counter and prescription medicines only as told by your health care provider. Follow directions carefully. Blood pressure medicines must be taken as prescribed.  Do not skip doses of blood pressure medicine. Doing this puts you at risk for problems and can make the medicine less effective.  Ask your health care provider about side effects or reactions to medicines that you should watch for. Contact a health care provider if:  You think you are having a reaction to a medicine you are taking.  You have headaches that keep coming back (recurring).  You feel dizzy.  You have swelling in your ankles.  You have trouble with your vision. Get help right away if:  You develop a severe headache or confusion.  You have unusual weakness or numbness.  You feel faint.  You have severe pain in your chest or abdomen.  You vomit repeatedly.  You have trouble breathing. Summary  Hypertension is when the force of blood pumping through your arteries is too strong. If this condition is not controlled, it may put you at risk for serious complications.  Your personal target blood pressure may vary depending on your medical conditions, your age, and other factors. For most people, a normal blood pressure is less than 120/80.  Hypertension is treated with lifestyle changes, medicines, or a combination of both. Lifestyle changes include weight loss, eating a healthy, low-sodium diet, exercising more, and limiting alcohol. This information is not intended to replace advice given to you  by your health care provider. Make sure you discuss any questions you have with your health care provider. Document Released: 01/25/2005 Document Revised: 12/24/2015 Document Reviewed: 12/24/2015 Elsevier Interactive Patient Education  [employer].

## 2017-07-13 NOTE — Progress Notes (Signed)
Impression and Recommendations:    1. Essential hypertension   2. Adjustment disorder with mixed anxiety and depressed mood- since teenager   3. Erectile dysfunction, unspecified erectile dysfunction type   4. Other migraine without status migrainosus, not intractable   5. Vitamin D insufficiency   6. Medication monitoring encounter   7. Health education/counseling   8. Family history of hyperlipidemia     1. Essential HTN -Increase verapamil dose to 240 mg qd to which pt agrees.  -BP remains elevated above goal at this time. -tolerating meds well without complication. -Continue checking your BP at home and keeping a log. Bring this into next OV.  -Goal BP: <130/80.  2. Adjustment disorder with mixed anxiety -continue wellbutrin. He is doing well on this.  3. Erectile dysfunction -last OV we started viagra, to which pt has not used yet.  -advised pt to use it or lose it. Encouraged pt to engage in healthy sex and intimacy with his husband.   4. Migraine without status migrainosus -Sx stable at this time with migraine frequency improved after starting meds.  -refill meds.   5. Vit D insufficiency -continue supplementation as listed below.   6. Medication monitoring encountering/Health education/counseling -extensively done today in office about erectile dysfunction, sexual intimacy with married partner of 25 years, and importance of exercise and diet with regards to BP and ED and libido. -Advised to decrease salt intake and increase water intake.  7. FMHx increased HLD -add FLP to recent blood work he had in office 07-11-17.  -Drink adequate amounts of water, equal to half of your wt in oz per day.  Orders Placed This Encounter  Procedures  . CBC with Differential/Platelet  . Hemoglobin A1c  . Lipid panel  . T4, free  . TSH  . Magnesium  . Phosphorus  . Microalbumin / creatinine urine ratio    Meds ordered this encounter  Medications  . SUMAtriptan  (IMITREX) 100 MG tablet    Sig: Take 1 tablet (100 mg total) by mouth as needed for migraine. May repeat in 2 hours if headache persists or recurs.    Dispense:  9 tablet    Refill:  0  . verapamil (CALAN-SR) 240 MG CR tablet    Sig: Take 1 tablet (240 mg total) by mouth at bedtime. PATIENT MUST HAVE OFFICE VISIT PRIOR TO ANY FURTHER REFILLS    Dispense:  90 tablet    Refill:  3    Gross side effects, risk and benefits, and alternatives of medications and treatment plan in general discussed with patient.  Patient is aware that all medications have potential side effects and we are unable to predict every side effect or drug-drug interaction that may occur.   Patient will call with any questions prior to using medication if they have concerns.  Expresses verbal understanding and consents to current therapy and treatment regimen.  No barriers to understanding were identified.  Red flag symptoms and signs discussed in detail.  Patient expressed understanding regarding what to do in case of emergency\urgent symptoms  Please see AVS handed out to patient at the end of our visit for further patient instructions/ counseling done pertaining to today's office visit.   Return for Please follow-up in 4-6 weeks for recheck of BP- inc Calan SR.    Note: This note was prepared with assistance of Dragon voice recognition software. Occasional wrong-word or sound-a-like substitutions may have occurred due to the inherent limitations of voice recognition  software.   This document serves as a record of services personally performed by Mellody Dance, DO. It was created on her behalf by Mayer Masker, a trained medical scribe. The creation of this record is based on the scribe's personal observations and the provider's statements to them.   I have reviewed the above medical documentation for accuracy and completeness and I concur.  Mellody Dance 07/17/17 9:53  PM  --------------------------------------------------------------------------------------------------------------------------------------------------------------------------------------------------------------------------------------------    Subjective:     HPI: Paul Schroeder is a 47 y.o. male who presents to [facility] today for issues as discussed below including ED and HTN.  ED From last OV we started him on viagra. He has not tried this medication yet. He has low libido. He has been together with his husband for 25 years.  Exercise He has been exercising, "power walking" 30 minutes BID. He is active at his job. He has more energy throughout the day.   Supplements He takes Vit D once weekly as well as daily multivitamin.  HA He has had a few headaches, with 2 migraines since last OV. He has taken verapramil with relief to his HA. The frequency of his recent migraines have reduced since last OV.   HTN HPI:  -  His blood pressure has not been controlled at home.  Pt is checking it at home.   -Goal BP: <130s/80s  -Average BP at home is 134/94  - Patient reports good compliance with blood pressure medications.  - Denies medication S-E   - Smoking Status noted   - He denies new onset of: chest pain, exercise intolerance, shortness of breath, dizziness, visual changes, headache, lower extremity swelling or claudication.   Last 3 blood pressure readings in our office are as follows: BP Readings from Last 3 Encounters:  07/13/17 (!) 133/94  02/25/17 (!) 145/88  12/03/16 (!) 158/102    Filed Weights   07/13/17 0953  Weight: 178 lb (80.7 kg)    Wt Readings from Last 3 Encounters:  07/13/17 178 lb (80.7 kg)  02/25/17 175 lb 8 oz (79.6 kg)  12/03/16 173 lb 12.8 oz (78.8 kg)   BP Readings from Last 3 Encounters:  07/13/17 (!) 133/94  02/25/17 (!) 145/88  12/03/16 (!) 158/102   Pulse Readings from Last 3 Encounters:  07/13/17 67   02/25/17 63  12/03/16 79   BMI Readings from Last 3 Encounters:  07/13/17 25.54 kg/m  02/25/17 25.18 kg/m  12/03/16 24.94 kg/m     Patient Care Team    Relationship Specialty Notifications Start End  Mellody Dance, DO PCP - General Family Medicine  08/12/16   Nori Riis, PA-C Physician Assistant Urology  03/10/15    Comment: REF PA  TO DR Jamal Collin 03-10-15  UNDER DR Cyndra Numbers, MD  General Surgery  03/10/15   Allyn Kenner, MD Consulting Physician Dermatology  08/12/16    Comment: Every 6 months goes for skin screenings due to his history of melanoma.  McKenzie, Candee Furbish, MD Consulting Physician Urology  08/12/16      Patient Active Problem List   Diagnosis Date Noted  . HTN (hypertension) 08/12/2016    Priority: High  . Tobacco use disorder- approximately 28-pack-year history 08/12/2016    Priority: High  . History of panic attacks 08/12/2016    Priority: High  . Adjustment disorder with mixed anxiety and depressed mood- since teenager 08/12/2016    Priority: High  . Gastroesophageal reflux  disease 09/10/2016    Priority: Medium  . 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 )  08/12/2016    Priority: Medium  . Pelvic floor dysfunction- treated by urology 04/22/2015    Priority: Medium  . Environmental and seasonal allergies 08/12/2016    Priority: Low  . Seasonal allergic rhinitis 08/12/2016    Priority: Low  . H/O non anemic vitamin B12 deficiency 08/12/2016    Priority: Low  . H/O iron deficiency anemia 08/12/2016    Priority: Low  . Encounter for counseling for tobacco use disorder 08/12/2016    Priority: Low  . Family history of mood disorder- MOM 08/12/2016    Priority: Low  . Medication monitoring encounter 07/13/2017  . Family history of hyperlipidemia 07/13/2017  . Health education/counseling 07/13/2017  . Erectile dysfunction 02/25/2017  . Vitamin D insufficiency 02/25/2017  . Anxious mood 09/10/2016  . HTN, goal  below 130/80 09/10/2016  . 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 Melanoma of skin ([place]) 08/12/2016  . Dysuria 10/07/2015  . Right groin pain 03/05/2015  . Hematospermia 02/22/2015  . Epididymitis, right 02/22/2015    Past Medical history, Surgical history, Family history, Social history, Allergies and Medications have been entered into the medical record, reviewed and changed as needed.    Current Meds  Medication Sig  . baclofen (LIORESAL) 10 MG tablet TAKE 1/2 (ONE-HALF) TABLET BY MOUTH TWICE DAILY  . BIOTIN 5000 PO Take by mouth.  Marland Kitchen buPROPion (WELLBUTRIN XL) 300 MG 24 hr tablet Take 1 tablet (300 mg total) by mouth daily.  . fexofenadine (ALLEGRA) 180 MG tablet Take 1 tablet (180 mg total) by mouth daily.  . finasteride (PROSCAR) 5 MG tablet Take 1/4 tablet by mouth daily  . losartan-hydrochlorothiazide (HYZAAR) 100-25 MG tablet Take 1 tablet by mouth daily.  . mometasone (NASONEX) 50 MCG/ACT nasal spray Place 2 sprays into the nose daily.  . montelukast (SINGULAIR) 10 MG tablet TAKE 1 TABLET AT BEDTIME  . Multiple Vitamin (ONE-A-DAY ESSENTIAL PO) Take by mouth.  . Probiotic Product (FORTIFY DAILY PROBIOTIC PO) Take by mouth.  . ranitidine (ZANTAC) 150 MG tablet Take 1 tablet (150 mg total) by mouth 2 (two) times daily.  . sildenafil (VIAGRA) 100 MG tablet Take 1 tablet (100 mg total) by mouth as needed for erectile dysfunction (for use prior to sexual activity).  . SUMAtriptan (IMITREX) 100 MG tablet Take 1 tablet (100 mg total) by mouth as needed for migraine. May repeat in 2 hours if headache persists or recurs.  . verapamil (CALAN-SR) 240 MG CR tablet Take 1 tablet (240 mg total) by mouth at bedtime. PATIENT MUST HAVE OFFICE VISIT PRIOR TO ANY FURTHER REFILLS  . Vitamin D, Ergocalciferol, (DRISDOL) 50000 units CAPS capsule Take 1 capsule (50,000 Units total) by mouth every 7 (seven) days.  . [DISCONTINUED] SUMAtriptan (IMITREX) 100 MG tablet TAKE 1 TABLET DAILY AS     NEEDED FOR  MIGRAINE AT     ONSET OF HEADACHE.  . [DISCONTINUED] verapamil (CALAN-SR) 120 MG CR tablet Take 1 tablet (120 mg total) by mouth at bedtime. PATIENT MUST HAVE OFFICE VISIT PRIOR TO ANY FURTHER REFILLS    Allergies:  No Known Allergies   Review of Systems:  A fourteen system review of systems was performed and found to be positive as per HPI.   Objective:   Blood pressure (!) 133/94, pulse 67, height 5\' 10"  (1.778 m), weight 178 lb (80.7 kg), SpO2 99 %. Body mass index is 25.54  kg/m. General:  Well Developed, well nourished, appropriate for stated age.  Neuro:  Alert and oriented,  extra-ocular muscles intact  HEENT:  Normocephalic, atraumatic, neck supple, no carotid bruits appreciated  Skin:  no gross rash, warm, pink. Cardiac:  RRR, S1 S2 Respiratory:  ECTA B/L and A/P, Not using accessory muscles, speaking in full sentences- unlabored. Vascular:  Ext warm, no cyanosis apprec.; cap RF less 2 sec. Psych:  No HI/SI, judgement and insight good, Euthymic mood. Full Affect.

## 2017-07-15 ENCOUNTER — Other Ambulatory Visit: Payer: Self-pay

## 2017-07-15 DIAGNOSIS — I10 Essential (primary) hypertension: Secondary | ICD-10-CM

## 2017-07-15 MED ORDER — HYDROCHLOROTHIAZIDE 25 MG PO TABS: 25.0000 mg | ORAL_TABLET | Freq: Every day | ORAL | 3 refills | Status: DC

## 2017-07-15 MED ORDER — LOSARTAN POTASSIUM 100 MG PO TABS: 100.0000 mg | ORAL_TABLET | Freq: Every day | ORAL | 3 refills | Status: DC

## 2017-07-15 NOTE — Telephone Encounter (Signed)
Hyzaar is on back order, per Dr. Raliegh Scarlet sent in separate RXs for losartan and HCTZ. MPulliam, CMA/RT(R)

## 2017-08-13 ENCOUNTER — Other Ambulatory Visit: Payer: Self-pay | Admitting: Family Medicine

## 2017-08-13 DIAGNOSIS — J302 Other seasonal allergic rhinitis: Secondary | ICD-10-CM

## 2017-08-19 ENCOUNTER — Other Ambulatory Visit: Payer: 59

## 2017-08-19 DIAGNOSIS — Z862 Personal history of diseases of the blood and blood-forming organs and certain disorders involving the immune mechanism: Secondary | ICD-10-CM

## 2017-08-19 DIAGNOSIS — Z83438 Family history of other disorder of lipoprotein metabolism and other lipidemia: Secondary | ICD-10-CM

## 2017-08-19 DIAGNOSIS — Z8639 Personal history of other endocrine, nutritional and metabolic disease: Secondary | ICD-10-CM

## 2017-08-19 DIAGNOSIS — E559 Vitamin D deficiency, unspecified: Secondary | ICD-10-CM

## 2017-08-19 DIAGNOSIS — I10 Essential (primary) hypertension: Secondary | ICD-10-CM

## 2017-08-20 LAB — CBC WITH DIFFERENTIAL/PLATELET
Basophils Absolute: 0 10*3/uL (ref 0.0–0.2)
Basos: 1 %
EOS (ABSOLUTE): 0.2 10*3/uL (ref 0.0–0.4)
Eos: 5 %
Hematocrit: 38.1 % (ref 37.5–51.0)
Hemoglobin: 13.1 g/dL (ref 13.0–17.7)
Immature Grans (Abs): 0 10*3/uL (ref 0.0–0.1)
Immature Granulocytes: 0 %
LYMPHS ABS: 1.5 10*3/uL (ref 0.7–3.1)
Lymphs: 30 %
MCH: 30 pg (ref 26.6–33.0)
MCHC: 34.4 g/dL (ref 31.5–35.7)
MCV: 87 fL (ref 79–97)
MONOS ABS: 0.5 10*3/uL (ref 0.1–0.9)
Monocytes: 9 %
NEUTROS ABS: 2.7 10*3/uL (ref 1.4–7.0)
NEUTROS PCT: 55 %
PLATELETS: 267 10*3/uL (ref 150–450)
RBC: 4.36 x10E6/uL (ref 4.14–5.80)
RDW: 12.1 % — AB (ref 12.3–15.4)
WBC: 5 10*3/uL (ref 3.4–10.8)

## 2017-08-20 LAB — MICROALBUMIN / CREATININE URINE RATIO
CREATININE, UR: 107.5 mg/dL
MICROALBUM., U, RANDOM: 5.7 ug/mL
Microalb/Creat Ratio: 5.3 mg/g creat (ref 0.0–30.0)

## 2017-08-20 LAB — LIPID PANEL
Chol/HDL Ratio: 2.8 ratio (ref 0.0–5.0)
Cholesterol, Total: 168 mg/dL (ref 100–199)
HDL: 59 mg/dL (ref >39)
LDL CALC: 98 mg/dL (ref 0–99)
Triglycerides: 57 mg/dL (ref 0–149)
VLDL CHOLESTEROL CAL: 11 mg/dL (ref 5–40)

## 2017-08-20 LAB — HEMOGLOBIN A1C
Est. average glucose Bld gHb Est-mCnc: 88 mg/dL
Hgb A1c MFr Bld: 4.7 % — ABNORMAL LOW (ref 4.8–5.6)

## 2017-08-20 LAB — MAGNESIUM: Magnesium: 2 mg/dL (ref 1.6–2.3)

## 2017-08-20 LAB — T4, FREE: Free T4: 1.34 ng/dL (ref 0.82–1.77)

## 2017-08-20 LAB — TSH: TSH: 2.55 u[IU]/mL (ref 0.450–4.500)

## 2017-08-20 LAB — PHOSPHORUS: Phosphorus: 2.4 mg/dL — ABNORMAL LOW (ref 2.5–4.5)

## 2017-08-26 ENCOUNTER — Ambulatory Visit (INDEPENDENT_AMBULATORY_CARE_PROVIDER_SITE_OTHER): Payer: 59 | Admitting: Family Medicine

## 2017-08-26 ENCOUNTER — Encounter: Payer: Self-pay | Admitting: Family Medicine

## 2017-08-26 VITALS — BP 123/76 | HR 65 | Ht 70.0 in | Wt 179.8 lb

## 2017-08-26 DIAGNOSIS — I10 Essential (primary) hypertension: Secondary | ICD-10-CM

## 2017-08-26 DIAGNOSIS — Z716 Tobacco abuse counseling: Secondary | ICD-10-CM

## 2017-08-26 DIAGNOSIS — M6289 Other specified disorders of muscle: Secondary | ICD-10-CM

## 2017-08-26 DIAGNOSIS — J3089 Other allergic rhinitis: Secondary | ICD-10-CM

## 2017-08-26 DIAGNOSIS — R1031 Right lower quadrant pain: Secondary | ICD-10-CM

## 2017-08-26 DIAGNOSIS — K219 Gastro-esophageal reflux disease without esophagitis: Secondary | ICD-10-CM

## 2017-08-26 DIAGNOSIS — F17200 Nicotine dependence, unspecified, uncomplicated: Secondary | ICD-10-CM

## 2017-08-26 DIAGNOSIS — E559 Vitamin D deficiency, unspecified: Secondary | ICD-10-CM

## 2017-08-26 DIAGNOSIS — F419 Anxiety disorder, unspecified: Secondary | ICD-10-CM

## 2017-08-26 DIAGNOSIS — J302 Other seasonal allergic rhinitis: Secondary | ICD-10-CM

## 2017-08-26 DIAGNOSIS — G43809 Other migraine, not intractable, without status migrainosus: Secondary | ICD-10-CM

## 2017-08-26 MED ORDER — FEXOFENADINE HCL 180 MG PO TABS: 180.0000 mg | ORAL_TABLET | Freq: Every day | ORAL | 1 refills | Status: DC

## 2017-08-26 MED ORDER — VITAMIN D (ERGOCALCIFEROL) 1.25 MG (50000 UNIT) PO CAPS: 50000.0000 [IU] | ORAL_CAPSULE | ORAL | 10 refills | Status: DC

## 2017-08-26 MED ORDER — RANITIDINE HCL 150 MG PO TABS: 150.0000 mg | ORAL_TABLET | Freq: Two times a day (BID) | ORAL | Status: DC

## 2017-08-26 MED ORDER — AMLODIPINE-VALSARTAN-HCTZ 5-160-12.5 MG PO TABS: 1.0000 | ORAL_TABLET | Freq: Every day | ORAL | 1 refills | Status: DC

## 2017-08-26 MED ORDER — BUPROPION HCL ER (XL) 300 MG PO TB24: 300.0000 mg | ORAL_TABLET | Freq: Every day | ORAL | 1 refills | Status: DC

## 2017-08-26 MED ORDER — SILDENAFIL CITRATE 100 MG PO TABS: 100.0000 mg | ORAL_TABLET | ORAL | 1 refills | Status: DC | PRN

## 2017-08-26 MED ORDER — MONTELUKAST SODIUM 10 MG PO TABS: 10.0000 mg | ORAL_TABLET | Freq: Every day | ORAL | 1 refills | Status: DC

## 2017-08-26 MED ORDER — VERAPAMIL HCL ER 240 MG PO TBCR: 240.0000 mg | EXTENDED_RELEASE_TABLET | Freq: Every day | ORAL | 3 refills | Status: DC

## 2017-08-26 MED ORDER — FINASTERIDE 5 MG PO TABS: ORAL_TABLET | ORAL | 2 refills | Status: DC

## 2017-08-26 MED ORDER — MOMETASONE FUROATE 50 MCG/ACT NA SUSP: 2.0000 | Freq: Every day | NASAL | 2 refills | Status: DC

## 2017-08-26 MED ORDER — SUMATRIPTAN SUCCINATE 100 MG PO TABS: 100.0000 mg | ORAL_TABLET | ORAL | 0 refills | Status: DC | PRN

## 2017-08-26 MED ORDER — BACLOFEN 10 MG PO TABS: ORAL_TABLET | ORAL | 0 refills | Status: DC

## 2017-08-26 NOTE — Progress Notes (Signed)
Impression and Recommendations:    1. Essential hypertension   2. Pelvic floor dysfunction   3. Right groin pain   4. h/o Tobacco use disorder- approximately 28-pack-year history   5. Encounter for counseling for tobacco use disorder   6. Anxious mood   7. Environmental and seasonal allergies   8. Seasonal allergic rhinitis, unspecified trigger   9. Gastroesophageal reflux disease, esophagitis presence not specified   10. Other migraine without status migrainosus, not intractable   11. Vitamin D insufficiency     1. Hypertension - Blood pressure remains elevated above goal at this time; not well-controlled at home on average. - Recommended change in treatment plan today.  See med list below. - Patient tolerating meds well without complication.  - Patient knows to keep a close eye on his blood pressure moving forward, especially given changes in treatment plan.  If anything alarming occurs, patient knows to return to clinic PRN.  - Ambulatory BP monitoring encouraged.  Keep log and bring in next OV.  Patient will try to sit for 15-20 minutes prior to taking blood pressure measurements, with no prior stimulation (caffiene, emotional, etc.).  However, he has not been in this habit in the past.  - Lifestyle changes such as dash diet and engaging in a regular exercise program discussed with patient.  Educational handouts provided  - Reviewed spokes of the wheel of health treatment, including prudent diet, adequate intake of water, adequate sleep, use of prayer/meditation, regular attention from a counselor/life coach.  2. Migraine without status migrainosus - Sx stable at this time with migraine frequency improved after starting meds.   3. Sleep/Insomnia - Encouraged the patient to continue using sound machine to aid his sleep at night.  4. BMI Counseling Explained to patient what BMI refers to, and what it means medically.    Told patient to think about it as a "medical risk  stratification measurement" and how increasing BMI is associated with increasing risk/ or worsening state of various diseases such as hypertension, hyperlipidemia, diabetes, premature OA, depression etc.  American Heart Association guidelines for healthy diet, basically Mediterranean diet, and exercise guidelines of 30 minutes 5 days per week or more discussed in detail.  Health counseling performed.  All questions answered.  5. Lifestyle & Preventative Health Maintenance - Advised patient to continue working toward exercising to improve overall mental, physical, and emotional health.    - Encouraged patient to continue to engage in daily physical activity, to his goal of 60 minutes most days per week.  Recommended that the patient eventually strive for at least 150 minutes of moderate cardiovascular activity per week according to guidelines established by the [facility].   - Healthy dietary habits encouraged, including low-carb, and high amounts of lean protein in diet.   - Patient should also consume adequate amounts of water - half of body weight in oz of water per day.   Education and routine counseling performed. Handouts provided.  6. Follow-Up - Continue to return for regularly scheduled chronic follow-up.  - Patient may return as needed for regularly scheduled chronic follow-up.   No orders of the defined types were placed in this encounter.  Medications Discontinued During This Encounter  Medication Reason  . losartan (COZAAR) 100 MG tablet Change in therapy  . baclofen (LIORESAL) 10 MG tablet Reorder  . buPROPion (WELLBUTRIN XL) 300 MG 24 hr tablet Reorder  . fexofenadine (ALLEGRA) 180 MG tablet Reorder  . finasteride (PROSCAR) 5 MG tablet  Reorder  . mometasone (NASONEX) 50 MCG/ACT nasal spray Reorder  . montelukast (SINGULAIR) 10 MG tablet Reorder  . ranitidine (ZANTAC) 150 MG tablet Reorder  . sildenafil (VIAGRA) 100 MG tablet Reorder  . SUMAtriptan (IMITREX) 100 MG tablet  Reorder  . verapamil (CALAN-SR) 240 MG CR tablet Reorder  . Vitamin D, Ergocalciferol, (DRISDOL) 50000 units CAPS capsule Reorder    Meds ordered this encounter  Medications  . baclofen (LIORESAL) 10 MG tablet    Sig: TAKE 1/2 (ONE-HALF) TABLET BY MOUTH TWICE DAILY    Dispense:  90 tablet    Refill:  0  . buPROPion (WELLBUTRIN XL) 300 MG 24 hr tablet    Sig: Take 1 tablet (300 mg total) by mouth daily.    Dispense:  90 tablet    Refill:  1  . fexofenadine (ALLEGRA) 180 MG tablet    Sig: Take 1 tablet (180 mg total) by mouth daily.    Dispense:  90 tablet    Refill:  1  . finasteride (PROSCAR) 5 MG tablet    Sig: Take 1/4 tablet by mouth daily    Dispense:  45 tablet    Refill:  2  . mometasone (NASONEX) 50 MCG/ACT nasal spray    Sig: Place 2 sprays into the nose daily.    Dispense:  17 g    Refill:  2  . montelukast (SINGULAIR) 10 MG tablet    Sig: Take 1 tablet (10 mg total) by mouth at bedtime.    Dispense:  90 tablet    Refill:  1  . ranitidine (ZANTAC) 150 MG tablet    Sig: Take 1 tablet (150 mg total) by mouth 2 (two) times daily.  . sildenafil (VIAGRA) 100 MG tablet    Sig: Take 1 tablet (100 mg total) by mouth as needed for erectile dysfunction (for use prior to sexual activity).    Dispense:  10 tablet    Refill:  1  . SUMAtriptan (IMITREX) 100 MG tablet    Sig: Take 1 tablet (100 mg total) by mouth as needed for migraine. May repeat in 2 hours if headache persists or recurs.    Dispense:  9 tablet    Refill:  0  . DISCONTD: verapamil (CALAN-SR) 240 MG CR tablet    Sig: Take 1 tablet (240 mg total) by mouth at bedtime. PATIENT MUST HAVE OFFICE VISIT PRIOR TO ANY FURTHER REFILLS    Dispense:  90 tablet    Refill:  3  . Vitamin D, Ergocalciferol, (DRISDOL) 50000 units CAPS capsule    Sig: Take 1 capsule (50,000 Units total) by mouth every 7 (seven) days.    Dispense:  12 capsule    Refill:  10  . amLODIPine-Valsartan-HCTZ 5-160-12.5 MG TABS    Sig: Take 1  tablet by mouth daily.    Dispense:  90 tablet    Refill:  1     The patient was counseled, risk factors were discussed, anticipatory guidance given.  Gross side effects, risk and benefits, and alternatives of medications discussed with patient.  Patient is aware that all medications have potential side effects and we are unable to predict every side effect or drug-drug interaction that may occur.  Expresses verbal understanding and consents to current therapy plan and treatment regimen.  Return in about 4 months (around 12/27/2017) for Since changing your blood pressure medicines, keep an eye on it follow-up sooner than planned if up.  Please see AVS handed out to patient at the  end of our visit for further patient instructions/ counseling done pertaining to today's office visit.    Note: This document was prepared using Dragon voice recognition software and may include unintentional dictation errors.  This document serves as a record of services personally performed by Mellody Dance, DO. It was created on her behalf by Toni Amend, a trained medical scribe. The creation of this record is based on the scribe's personal observations and the provider's statements to them.   I have reviewed the above medical documentation for accuracy and completeness and I concur.  Mellody Dance 09/04/17 9:39 PM    Subjective:    HPI: Paul Schroeder is a 47 y.o. male who presents to [facility] [facility] today for follow up for HTN.    Overall patient has been doing well.  Notes that the heat is ridiculous and "I want winter back."  Patient walks three times per day, every day of the week.  He walks about 20 minutes each episode, for an hour of exercise daily.  States he may not be drinking adequate amounts of water.  Sleeping Habits Patient has no trouble falling asleep, mainly staying asleep.  States that he wakes up in the middle of the night and can't get back  to sleep due to his partner's snoring.  HTN:  -  His blood pressure has not been controlled at home.   Blood Pressure Readings at Home  Range (124-158 / 80-109)  139/96 154/104 126/83 153/100 133/85 124/80 136/88 153/109 158/103 146/94  Not well controlled.  Patient states that higher readings always tend to be at night.  Feels his average has come down these last couple of weeks to the 130 range.  Patient remarks that he notices his blood pressure elevate when he's gone out to eat and consumed a lot of salt.  - Patient reports good compliance with blood pressure medications.  Patient states he is amenable to changes in his treatment plan as long as his migraines don't come back.  - Denies medication S-E   - Smoking Status noted   - He denies new onset of: chest pain, exercise intolerance, shortness of breath, dizziness, visual changes, headache, lower extremity swelling or claudication.   Last 3 blood pressure readings in our office are as follows: BP Readings from Last 3 Encounters:  08/26/17 123/76  07/13/17 (!) 133/94  02/25/17 (!) 145/88    Pulse Readings from Last 3 Encounters:  08/26/17 65  07/13/17 67  02/25/17 63    Filed Weights   08/26/17 1021  Weight: 179 lb 12.8 oz (81.6 kg)      Patient Care Team    Relationship Specialty Notifications Start End  Mellody Dance, DO PCP - General Family Medicine  08/12/16   Nori Riis, PA-C Physician Assistant Urology  03/10/15    Comment: REF PA  TO DR Jamal Collin 03-10-15  UNDER DR Cyndra Numbers, MD  General Surgery  03/10/15   Allyn Kenner, MD Consulting Physician Dermatology  08/12/16    Comment: Every 6 months goes for skin screenings due to his history of melanoma.  Cleon Gustin, MD Consulting Physician Urology  08/12/16      Lab Results  Component Value Date   CREATININE 0.96 07/11/2017   BUN 13 07/11/2017   NA 145 (H) 07/11/2017   K 3.6 07/11/2017   CL 102 07/11/2017    CO2 22 07/11/2017    Lab Results  Component  Value Date   CHOL 168 08/19/2017   CHOL 150 08/23/2016   CHOL 186 07/26/2014    Lab Results  Component Value Date   HDL 59 08/19/2017   HDL 58 08/23/2016   HDL 48 07/26/2014    Lab Results  Component Value Date   LDLCALC 98 08/19/2017   LDLCALC 70 08/23/2016   LDLCALC 111 07/26/2014    Lab Results  Component Value Date   TRIG 57 08/19/2017   TRIG 111 08/23/2016   TRIG 135 07/26/2014    Lab Results  Component Value Date   CHOLHDL 2.8 08/19/2017   CHOLHDL 2.6 08/23/2016    No results found for: LDLDIRECT ===================================================================   Patient Active Problem List   Diagnosis Date Noted  . HTN (hypertension) 08/12/2016    Priority: High  . Tobacco use disorder- approximately 28-pack-year history 08/12/2016    Priority: High  . History of panic attacks 08/12/2016    Priority: High  . Adjustment disorder with mixed anxiety and depressed mood- since teenager 08/12/2016    Priority: High  . Gastroesophageal reflux disease 09/10/2016    Priority: Medium  . 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 )  08/12/2016    Priority: Medium  . Pelvic floor dysfunction- treated by urology 04/22/2015    Priority: Medium  . Environmental and seasonal allergies 08/12/2016    Priority: Low  . Seasonal allergic rhinitis 08/12/2016    Priority: Low  . H/O non anemic vitamin B12 deficiency 08/12/2016    Priority: Low  . H/O iron deficiency anemia 08/12/2016    Priority: Low  . Encounter for counseling for tobacco use disorder 08/12/2016    Priority: Low  . Family history of mood disorder- MOM 08/12/2016    Priority: Low  . Medication monitoring encounter 07/13/2017  . Family history of hyperlipidemia 07/13/2017  . Health education/counseling 07/13/2017  . Erectile dysfunction 02/25/2017  . Vitamin D insufficiency 02/25/2017  . Anxious mood 09/10/2016  . HTN, goal below 130/80  09/10/2016  . 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 Melanoma of skin ([place]) 08/12/2016  . Dysuria 10/07/2015  . Right groin pain 03/05/2015  . Hematospermia 02/22/2015  . Epididymitis, right 02/22/2015     Past Medical History:  Diagnosis Date  . Allergy   . Anxiety   . Bronchitis   . Cancer [facility]) 2013   Malignant mole on his back removed   . Hematospermia   . Hemorrhoids   . Migraines    family Hx, triggered by stress  . Seasonal allergies      Past Surgical History:  Procedure Laterality Date  . skin lesions removed       Family History  Problem Relation Age of Onset  . Hypertension Mother   . Hyperlipidemia Mother   . Stroke Mother   . Depression Mother   . Kidney disease Brother   . Kidney disease Maternal Grandfather   . Heart disease Maternal Grandfather   . Prostate cancer Neg Hx      Social History   Substance and Sexual Activity  Drug Use Yes  . Frequency: 2.0 times per week   Comment: marijuana  ,  Social History   Substance and Sexual Activity  Alcohol Use Yes  . Alcohol/week: 2.4 oz  . Types: 4 Shots of liquor per week  ,  Social History   Tobacco Use  Smoking Status Current Every Day Smoker  . Packs/day: 1.00  . Years: 3.00  . Pack years: 3.00  .  Types: Cigarettes  Smokeless Tobacco Never Used  ,    Current Outpatient Medications on File Prior to Visit  Medication Sig Dispense Refill  . BIOTIN 5000 PO Take by mouth.    . hydrochlorothiazide (HYDRODIURIL) 25 MG tablet Take 1 tablet (25 mg total) by mouth daily. 90 tablet 3  . losartan (COZAAR) 100 MG tablet Take 100 mg by mouth daily.    . Multiple Vitamin (ONE-A-DAY ESSENTIAL PO) Take by mouth.    . Probiotic Product (FORTIFY DAILY PROBIOTIC PO) Take by mouth.    . losartan-hydrochlorothiazide (HYZAAR) 100-25 MG tablet Take 1 tablet by mouth daily. (Patient not taking: Reported on 08/26/2017) 90 tablet 3   No current facility-administered medications on file prior to visit.       No Known Allergies   Review of Systems:   General:  Denies fever, chills Optho/Auditory:   Denies visual changes, blurred vision Respiratory:   Denies SOB, cough, wheeze, DIB  Cardiovascular:   Denies chest pain, palpitations, painful respirations Gastrointestinal:   Denies nausea, vomiting, diarrhea.  Endocrine:     Denies new hot or cold intolerance Musculoskeletal:  Denies joint swelling, gait issues, or new unexplained myalgias/ arthralgias Skin:  Denies rash, suspicious lesions  Neurological:    Denies dizziness, unexplained weakness, numbness  Psychiatric/Behavioral:   Denies mood changes  Objective:    Blood pressure 123/76, pulse 65, height 5\' 10"  (1.778 m), weight 179 lb 12.8 oz (81.6 kg), SpO2 97 %.  Body mass index is 25.8 kg/m.  General: Well Developed, well nourished, and in no acute distress.  HEENT: Normocephalic, atraumatic, pupils equal round reactive to light, neck supple, No carotid bruits, no JVD Skin: Warm and dry, cap RF less 2 sec Cardiac: Regular rate and rhythm, S1, S2 WNL's, no murmurs rubs or gallops Respiratory: ECTA B/L, Not using accessory muscles, speaking in full sentences. NeuroM-Sk: Ambulates w/o assistance, moves ext * 4 w/o difficulty, sensation grossly intact.  Ext: scant edema b/l lower ext Psych: No HI/SI, judgement and insight good, Euthymic mood. Full Affect.

## 2017-08-29 ENCOUNTER — Telehealth: Payer: Self-pay

## 2017-08-29 NOTE — Telephone Encounter (Signed)
Pharmacy sent over medication change request.  Patient is on Verapamil ER 240 mg and the medication is on mfr backorder.  Please review and advise an alternative medication for the patient.  MPulliam, CMA/RT(R)

## 2017-08-30 NOTE — Telephone Encounter (Signed)
Called [facility] and they informed my that they do have any of these medications and there is no backorder on them. MPulliam, CMA/RT(R)

## 2017-08-30 NOTE — Telephone Encounter (Signed)
Please see if pharmacy has plain verapamil or if that is on back order as well, see if they have or can gwet Verelan PM or Isoptin SR or Covera HS.  Thanks

## 2017-08-31 ENCOUNTER — Other Ambulatory Visit: Payer: Self-pay

## 2017-08-31 DIAGNOSIS — I10 Essential (primary) hypertension: Secondary | ICD-10-CM

## 2017-08-31 MED ORDER — VERAPAMIL HCL ER 120 MG PO TBCR: 240.0000 mg | EXTENDED_RELEASE_TABLET | Freq: Every day | ORAL | Status: DC

## 2017-08-31 NOTE — Progress Notes (Signed)
Changed due to backorder of the 240mg  dose. MPulliam, CMA/RT(R)

## 2017-08-31 NOTE — Telephone Encounter (Signed)
Spoke to [facility] and they stated that they could change to 120 mg 2 tabs daily.  Approved by Dr. Raliegh Scarlet. Updated in chart. MPulliam, CMA/RT(R)

## 2017-08-31 NOTE — Telephone Encounter (Signed)
tell them to use 1 of them- (whichever cheaper for pt)  at the same exact dose and same sig.

## 2017-11-05 ENCOUNTER — Other Ambulatory Visit: Payer: Self-pay | Admitting: Family Medicine

## 2017-11-05 DIAGNOSIS — Z716 Tobacco abuse counseling: Secondary | ICD-10-CM

## 2017-11-05 DIAGNOSIS — F419 Anxiety disorder, unspecified: Secondary | ICD-10-CM

## 2017-11-05 DIAGNOSIS — F17200 Nicotine dependence, unspecified, uncomplicated: Secondary | ICD-10-CM

## 2017-12-19 ENCOUNTER — Other Ambulatory Visit: Payer: Self-pay | Admitting: Family Medicine

## 2017-12-19 DIAGNOSIS — M6289 Other specified disorders of muscle: Secondary | ICD-10-CM

## 2017-12-19 DIAGNOSIS — R1031 Right lower quadrant pain: Secondary | ICD-10-CM

## 2017-12-23 ENCOUNTER — Ambulatory Visit: Payer: 59 | Admitting: Family Medicine

## 2018-01-13 ENCOUNTER — Ambulatory Visit (INDEPENDENT_AMBULATORY_CARE_PROVIDER_SITE_OTHER): Payer: 59 | Admitting: Family Medicine

## 2018-01-13 ENCOUNTER — Encounter: Payer: Self-pay | Admitting: Family Medicine

## 2018-01-13 VITALS — BP 137/89 | HR 63 | Temp 98.5°F | Ht 70.0 in | Wt 195.0 lb

## 2018-01-13 DIAGNOSIS — G43101 Migraine with aura, not intractable, with status migrainosus: Secondary | ICD-10-CM

## 2018-01-13 DIAGNOSIS — Z87891 Personal history of nicotine dependence: Secondary | ICD-10-CM | POA: Diagnosis not present

## 2018-01-13 DIAGNOSIS — G4700 Insomnia, unspecified: Secondary | ICD-10-CM

## 2018-01-13 DIAGNOSIS — F1510 Other stimulant abuse, uncomplicated: Secondary | ICD-10-CM | POA: Diagnosis not present

## 2018-01-13 DIAGNOSIS — I10 Essential (primary) hypertension: Secondary | ICD-10-CM

## 2018-01-13 DIAGNOSIS — F4323 Adjustment disorder with mixed anxiety and depressed mood: Secondary | ICD-10-CM

## 2018-01-13 NOTE — Progress Notes (Signed)
Impression and Recommendations:    1. 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 Migraine with aura and with status migrainosus, not intractable   4. H/O tobacco use, presenting hazards to health (28-29-pack-year history quit even vaping in mid 2019   5. Adjustment disorder with mixed anxiety and depressed mood- since teenager   6. 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 Essential HTN -Patients' blood pressure has been averaging 134/90 at home.  -Blood pressure at 137/89 in the office today.   -Discussed goal blood pressure of 130/80 or below with the patient today.  -Patient continues on verapamil and amlodipine-valsartan-HCTZ at this time with good tolerance.  -Lifestyle changes such as dash diet and engaging in a regular exercise program discussed with patient.  Educational handouts provided -Ambulatory BP monitoring encouraged. Keep log and bring in next OV -Continue current medication(s). Also, risks and benefits of medications discussed with patient, including alternative treatments. Encouraged patient to read drug information handouts to further educate self about the medicine prior to starting it.  -Contact us prior with any Q's/ concerns.   2. BMI Counseling -He is exercising 30 minutes twice a day 5-6 days a week.  -Explained to patient what BMI refers to, and what it means medically. Told patient to think about it as a "medical risk stratification measurement" and how increasing BMI is associated with increasing risk/ or worsening state of various diseases such as  hypertension, hyperlipidemia, diabetes, premature OA, depression etc.  -American Heart Association guidelines for healthy diet, basically Mediterranean diet, and exercise guidelines of 30 minutes 5 days per week or more discussed in detail.  -Health counseling performed.  All questions answered.  3. Hx of Migraine:  -Patient tolerating Imitrex well at this time, patient to continue on this medication at this time.   4. Caffeine Abuse/Trouble falling asleep -Patient switched to no caffeine at night to help with this.  -Advised the patient to alternate caffeine and non-caffeinated drinks throughout the day to aid with this.   5. Mood -Patient continues on 300 mg daily of Buspar.  -He tolerates this medication well and will continue on this medication.   6. Hx of Tobacco abuse:  -Since his last visit to the office, he stopped smoking and vaping at this time.   Follow up as scheduled.     Education and routine counseling performed. Handouts provided.   Medications Discontinued During This Encounter  Medication Reason  . buPROPion (WELLBUTRIN XL) 300 MG 24 hr tablet Duplicate  . losartan-hydrochlorothiazide (HYZAAR) 326-71 MG tablet Duplicate     The patient was counseled, risk factors were discussed, anticipatory guidance given.  Gross side effects, risk and benefits, and alternatives of medications discussed with patient.  Patient is aware that all medications have potential side effects and we are unable to predict every side effect or drug-drug interaction that may occur.  Expresses verbal understanding and consents to current therapy plan and treatment regimen.  Return for f/up end of jan for CPE- come fasting for bldwrk i.  Please see AVS handed out  to patient at the end of our visit for further patient instructions/ counseling done pertaining to today's office visit.    Note:  This document was prepared using Dragon voice recognition software and may include unintentional  dictation errors.   This document serves as a record of services personally performed by Mellody Dance, DO. It was created on her behalf by Steva Colder, a trained medical scribe. The creation of this record is based on the scribe's personal observations and the provider's statements to them.   I have reviewed the above medical documentation for accuracy and completeness and I concur.  Mellody Dance, DO     Subjective:    HPI: Paul Schroeder is a 47 y.o. male who presents to [facility] today for follow up of [place].    He is walking twice a day at this time. He reports that he would like to go back to his low-carb diet due to his weight not reflecting his diet changes. He consumes four 20 ounce bottles of diet pepsi daily. His hair loss is maintaining.   Migraine:  He is taking his Imitrex as prescribed, however, notes that he has used it more this month and attributes it to his family and traveling. He has cut out all alcohol consumption, however, had a glass of wine for the first time in 6 months.   Hx of Smoking:  -He stopped smoking and vaping at this time.   HTN:  -  His blood pressure has been controlled at home.  Pt has been checking it regularly. His average blood pressures have been 134/90 at home.   - Patient reports good compliance with blood pressure medications  - Denies medication S-E   - Smoking Status noted   - He denies new onset of: chest pain, exercise intolerance, shortness of breath, dizziness, visual changes, headache, lower extremity swelling or claudication.    Last 3 blood pressure readings in our office are as follows: BP Readings from Last 3 Encounters:  01/13/18 137/89  08/26/17 123/76  07/13/17 (!) 133/94    Pulse Readings from Last 3 Encounters:  01/13/18 63  08/26/17 65  07/13/17 67    Filed Weights   01/13/18 1028  Weight: 195 lb (88.5 kg)      Patient Care Team    Relationship  Specialty Notifications Start End  Mellody Dance, DO PCP - General Family Medicine  08/12/16   Nori Riis, PA-C Physician Assistant Urology  03/10/15    Comment: REF PA  TO DR Jamal Collin 03-10-15  UNDER DR Cyndra Numbers, MD  General Surgery  03/10/15   Allyn Kenner, MD Consulting Physician Dermatology  08/12/16    Comment: Every 6 months goes for skin screenings due to his history of melanoma.  Cleon Gustin, MD Consulting Physician Urology  08/12/16      Lab Results  Component Value Date   CREATININE 0.96 07/11/2017   BUN 13 07/11/2017   NA 145 (H) 07/11/2017   K 3.6 07/11/2017   CL 102 07/11/2017   CO2 22 07/11/2017    Lab Results  Component Value Date   CHOL 168 08/19/2017   CHOL 150 08/23/2016   CHOL 186 07/26/2014    Lab Results  Component Value Date   HDL 59 08/19/2017   HDL 58 08/23/2016   HDL 48 07/26/2014    Lab Results  Component Value Date   LDLCALC 98 08/19/2017  LDLCALC 70 08/23/2016   LDLCALC 111 07/26/2014    Lab Results  Component Value Date   TRIG 57 08/19/2017   TRIG 111 08/23/2016   TRIG 135 07/26/2014    Lab Results  Component Value Date   CHOLHDL 2.8 08/19/2017   CHOLHDL 2.6 08/23/2016    No results found for: LDLDIRECT ===================================================================   Patient Active Problem List   Diagnosis Date Noted  . HTN (hypertension) 08/12/2016    Priority: High  . Tobacco use disorder- approximately 28-pack-year history- NOW quit even vaping! 08/12/2016    Priority: High  . History of panic attacks 08/12/2016    Priority: High  . Adjustment disorder with mixed anxiety and depressed mood- since teenager 08/12/2016    Priority: High  . Gastroesophageal reflux disease 09/10/2016    Priority: Medium  . 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 )  08/12/2016    Priority: Medium  . Pelvic floor dysfunction- treated by urology 04/22/2015    Priority: Medium  .  Environmental and seasonal allergies 08/12/2016    Priority: Low  . Seasonal allergic rhinitis 08/12/2016    Priority: Low  . H/O non anemic vitamin B12 deficiency 08/12/2016    Priority: Low  . H/O iron deficiency anemia 08/12/2016    Priority: Low  . Encounter for counseling for tobacco use disorder 08/12/2016    Priority: Low  . Family history of mood disorder- MOM 08/12/2016    Priority: Low  . 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 Insomnia 01/13/2018  . Medication monitoring encounter 07/13/2017  . Family history of hyperlipidemia 07/13/2017  . Health education/counseling 07/13/2017  . Erectile dysfunction 02/25/2017  . Vitamin D insufficiency 02/25/2017  . Anxious mood 09/10/2016  . HTN, goal below 130/80 09/10/2016  . 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 Melanoma of skin ([place]) 08/12/2016  . Dysuria 10/07/2015  . Right groin pain 03/05/2015  . Hematospermia 02/22/2015  . Epididymitis, right 02/22/2015     Past Medical History:  Diagnosis Date  . Allergy   . Anxiety   . Bronchitis   . Cancer [facility]) 2013   Malignant mole on his back removed   . Hematospermia   . Hemorrhoids   . Migraines    family Hx, triggered by stress  . Seasonal allergies      Past Surgical History:  Procedure Laterality Date  . skin lesions removed       Family History  Problem Relation Age of Onset  . Hypertension Mother   . Hyperlipidemia Mother   . Stroke Mother   . Depression Mother   . Kidney disease Brother   . Kidney disease Maternal Grandfather   . Heart disease Maternal Grandfather   . Prostate cancer Neg Hx      Social History   Substance and Sexual Activity  Drug Use Yes  . Frequency: 2.0 times per week   Comment: marijuana  ,  Social History   Substance and Sexual Activity  Alcohol Use Yes  . Alcohol/week: 4.0 standard drinks  . Types: 4 Shots of liquor per week  ,  Social History   Tobacco Use  Smoking Status Current Every Day Smoker  . Packs/day: 1.00   . Years: 3.00  . Pack years: 3.00  . Types: Cigarettes  Smokeless Tobacco Never Used  ,    Current Outpatient Medications on File Prior to Visit  Medication Sig Dispense Refill  . baclofen (LIORESAL) 10 MG tablet TAKE 1/2 TABLET (=5MG ) TWO TIMES A  DAY 90 tablet 0  . BIOTIN 5000 PO Take by mouth.    Marland Kitchen buPROPion (WELLBUTRIN XL) 300 MG 24 hr tablet Take 1 tablet (300 mg total) by mouth daily. 90 tablet 1  . fexofenadine (ALLEGRA) 180 MG tablet Take 1 tablet (180 mg total) by mouth daily. 90 tablet 1  . finasteride (PROSCAR) 5 MG tablet Take 1/4 tablet by mouth daily 45 tablet 2  . mometasone (NASONEX) 50 MCG/ACT nasal spray Place 2 sprays into the nose daily. 17 g 2  . montelukast (SINGULAIR) 10 MG tablet Take 1 tablet (10 mg total) by mouth at bedtime. 90 tablet 1  . Multiple Vitamin (ONE-A-DAY ESSENTIAL PO) Take by mouth.    . Probiotic Product (FORTIFY DAILY PROBIOTIC PO) Take by mouth.    . ranitidine (ZANTAC) 150 MG tablet Take 1 tablet (150 mg total) by mouth 2 (two) times daily.    . sildenafil (VIAGRA) 100 MG tablet Take 1 tablet (100 mg total) by mouth as needed for erectile dysfunction (for use prior to sexual activity). 10 tablet 1  . verapamil (CALAN-SR) 120 MG CR tablet Take 2 tablets (240 mg total) by mouth at bedtime. PATIENT MUST HAVE OFFICE VISIT PRIOR TO ANY FURTHER REFILLS    . Vitamin D, Ergocalciferol, (DRISDOL) 50000 units CAPS capsule Take 1 capsule (50,000 Units total) by mouth every 7 (seven) days. 12 capsule 10  . hydrochlorothiazide (HYDRODIURIL) 25 MG tablet Take 1 tablet (25 mg total) by mouth daily. (Patient not taking: Reported on 01/13/2018) 90 tablet 3  . losartan (COZAAR) 100 MG tablet Take 100 mg by mouth daily.     No current facility-administered medications on file prior to visit.      No Known Allergies   Review of Systems:   General:  Denies fever, chills Optho/Auditory:   Denies visual changes, blurred vision Respiratory:   Denies SOB, cough,  wheeze, DIB  Cardiovascular:   Denies chest pain, palpitations, painful respirations Gastrointestinal:   Denies nausea, vomiting, diarrhea.  Endocrine:     Denies new hot or cold intolerance Musculoskeletal:  Denies joint swelling, gait issues, or new unexplained myalgias/ arthralgias Skin:  Denies rash, suspicious lesions  Neurological:    Denies dizziness, unexplained weakness, numbness  Psychiatric/Behavioral:   Denies mood changes  Objective:    Blood pressure 137/89, pulse 63, temperature 98.5 F (36.9 C), height 5\' 10"  (1.778 m), weight 195 lb (88.5 kg), SpO2 100 %.  Body mass index is 27.98 kg/m.  General: Well Developed, well nourished, and in no acute distress.  HEENT: Normocephalic, atraumatic, pupils equal round reactive to light, neck supple, No carotid bruits, no JVD Skin: Warm and dry, cap RF less 2 sec Cardiac: Regular rate and rhythm, S1, S2 WNL's, no murmurs rubs or gallops Respiratory: ECTA B/L, Not using accessory muscles, speaking in full sentences. NeuroM-Sk: Ambulates w/o assistance, moves ext * 4 w/o difficulty, sensation grossly intact.  Ext: scant edema b/l lower ext Psych: No HI/SI, judgement and insight good, Euthymic mood. Full Affect.

## 2018-01-13 NOTE — Patient Instructions (Addendum)
Paul Schroeder, you can schedule your physical at any time in the near future you wish.  The last time we did labs with 08/26/2017 and the last time we did a physical was back in 2018.  Hence, if you want to wait 6 months from the last labs which would be around January 19th or little later, that would be great.  Please get up the great work on the cardio of an hour a day which is fantastic.  You can increase intensity and try to very the speed that you are walking over that time.        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 Caffeine is a drug that is found in many foods and beverages, such as coffee, tea, chocolate, and energy drinks. Caffeine is also found in some medicines and over-the-counter products like alertness tablets or weight loss pills. Caffeine may be bad for your health when you have too much. People with caffeine use disorder continue to consume caffeine even though they know it may be causing problems with their physical or mental health. What are the causes? This condition is caused by consuming too much caffeine over time. What increases the risk? You are more likely to develop this condition if:  You have more than 400 mg of caffeine-or 4 cups of coffee-each day.  You have a history or family history of a substance use disorder or alcohol abuse.  You have a psychiatric or mental health disorder, or you have been treated for one.  What are the signs or symptoms? Symptoms of this condition include:  Finding it hard to get through the day without caffeine. Caffeine is hurting your ability to function.  Wanting to cut down on (limit) your caffeine intake but finding it hard to do so.  Having withdrawal symptoms within 24 hours of not having caffeine. Withdrawal symptoms may include: ? Headaches. ? Feeling tired (fatigued) or sleepy. ? Feeling irritable, angry, or depressed. ? Not being able to  focus. ? Feeling like you have the flu.  Having trouble falling asleep because of caffeine. You may also fall asleep when you are not supposed to-such as at work or school-whenever you do not have enough caffeine.  Continuing to use caffeine even though it harms your body or emotions. You are concerned that your high caffeine intake is bad for your health.  How is this diagnosed? This condition may be diagnosed with an assessment by your health care provider. During the assessment, your health care provider may:  Ask questions about how much caffeine you consume each day.  Try to determine if your caffeine intake is harming your health. Some health problems that may be related to caffeine use include: ? Heart or stomach problems. ? Fibrocystic breast disease. ? Stress. ? Being unable to sleep (insomnia). ? Urinary issues.  Ask questions about why you find it hard to quit consuming caffeine or why you have had trouble quitting in the past.  How is this treated? Treatment for this condition involves reducing your intake of caffeine over time. Your health care provider will discuss ways to do this and how to manage caffeine withdrawal symptoms. He or she may help you figure out the best goal for how much caffeine you should consume on a regular basis-for example, whether you should cut down to one soda a day or none at all. In some cases, your health care provider may  recommend counseling to help treat your caffeine use disorder. Follow these instructions at home:  Follow instructions from your health care provider about how to cut down on caffeine. This may involve: ? Slowly reducing your intake of caffeine. For example, this may mean gradually having fewer cups of coffee each day-cutting out one more cup each day-until you are able to have only one cup daily. ? Mixing a caffeinated soda with a decaf (decaffeinated) soda. ? Replacing coffee, tea, or soda with a decaf drink.  Do not stop  having caffeine all at once. Doing that may cause severe withdrawal symptoms.  Find ways to reduce stress, such as by: ? Meditating. ? Being more active. ? Using deep breathing exercises. Contact a health care provider if:  You cannot cut down on caffeine.  Withdrawal symptoms get worse or they do not go away. Get help right away if:  You have severe caffeine withdrawal symptoms, such as vomiting or depression. Summary  Having caffeine use disorder means that a person cannot control how much caffeine he or she consumes.  Symptoms of caffeine use disorder include wanting to limit (cut down on) your caffeine intake but finding it hard to do so.  Follow instructions from your health care provider about how to cut down on caffeine. This information is not intended to replace advice given to you by your health care provider. Make sure you discuss any questions you have with your health care provider. Document Released: 05/20/2016 Document Revised: 05/20/2016 Document Reviewed: 05/20/2016 Elsevier Interactive Patient Education  2018 [employer].     Caffeine Withdrawal Caffeine withdrawal is a group of symptoms that can develop when a person who consumes a lot of caffeine every day suddenly stops or greatly reduces his or her caffeine intake. Caffeine is a drug that is usually found in coffee, tea, soda, cocoa, chocolate milk, chocolate, and some over-the-counter pain relievers and medicines. If you consume too much caffeine for a long period of time, you may go through caffeine withdrawal when you stop or reduce your intake. What are the causes? This condition is caused by having less caffeine than your body is used to having. What increases the risk? The following factors may make you more likely to develop this condition:  Having a history of other substance use disorders.  Having a history of a mood disorder, anxiety disorder, psychiatric disorder, or eating disorder.  Being in  a situation that restricts caffeine use, such as pregnancy, fasting, medical procedures, or hospitalization.  Using energy drinks.  What are the signs or symptoms? Symptoms of this condition include:  Feeling more tired than usual.  Headaches.  Having trouble concentrating or staying alert.  Feeling irritable.  Feeling like you have the flu.  Craving caffeine.  Depression.  Nausea or vomiting.  Joint stiffness or aching muscles.  Your symptoms may be more or less severe, depending on how much caffeine you consume or have been consuming over time. How is this diagnosed? This condition is usually diagnosed based on your symptoms and your recent history of caffeine use. Your health care provider may ask you about any history of stimulant abuse or use of other substances. In some cases, you may be asked to use a food journal to keep track of how much caffeine you have every day. How is this treated? Treatment for this condition will focus on addressing the symptoms. Your health care provider may recommend that you:  Consume more caffeine at first to help end  the withdrawal symptoms.  Slowly reduce your caffeine use over time to avoid symptoms of caffeine withdrawal while removing it from your diet. Your health care provider can help you decide if you want to limit (cut back on) or eliminate caffeine from your diet.  Other treatments may be recommended to help with any underlying reasons for your high caffeine use. Your health care provider may also recommend techniques to manage stress. Follow these instructions at home:  Do not stop having caffeine all at once. Doing that may cause severe withdrawal symptoms.  To avoid withdrawal symptoms, do not have more than 50 mg of caffeine-equal to  cup of coffee-in one day.  Cut back on caffeine slowly over time as directed by your health care provider. For example, try mixing a caffeinated soda with a decaf (decaffeinated) soda.  Try  replacing coffee, tea, or soda with a decaf drink.  Find ways to manage stress, such as by: ? Meditating. ? Being more active. ? Using deep breathing exercises. Contact a health care provider if:  Your headaches or other withdrawal symptoms do not go away after several days of reduced usage, or they do not go away after you start using caffeine again. Get help right away if:  You feel depressed or have suicidal thoughts.  You are vomiting or have severe dehydration. If you ever feel like you may hurt yourself or others, or have thoughts about taking your own life, get help right away. You can go to your nearest emergency department or call:  Your local emergency services (911 in the [place]  A suicide crisis helpline, such as the [facility] at 978-261-8182. This is open 24 hours a day.  Summary  Caffeine withdrawal is a group of symptoms that can develop when a person who consumes a lot of caffeine every day suddenly stops or greatly reduces his or her caffeine intake.  Your withdrawal symptoms may be more or less severe, depending on how much caffeine you consume or have been consuming over time.  Your health care provider can help you decide if you want to limit (cut back on) or eliminate caffeine from your diet.  To avoid withdrawal symptoms, do not have more than 50 mg of caffeine-equal to  cup of coffee-in one day. This information is not intended to replace advice given to you by your health care provider. Make sure you discuss any questions you have with your health care provider. Document Released: 05/20/2016 Document Revised: 05/20/2016 Document Reviewed: 05/20/2016 Elsevier Interactive Patient Education  [employer].        Your goal blood pressure should be 135/85 or less on a regular basis, or medications should be started/ modified.    Normal blood pressure is less than 120/80.    Hypertension Hypertension, commonly called  high blood pressure, is when the force of blood pumping through the arteries is too strong. The arteries are the blood vessels that carry blood from the heart throughout the body. Hypertension forces the heart to work harder to pump blood and may cause arteries to become narrow or stiff. Having untreated or uncontrolled hypertension can cause heart attacks, strokes, kidney disease, and other problems. A blood pressure reading consists of a higher number over a lower number. Ideally, your blood pressure should be below 120/80. The first ("top") number is called the systolic pressure. It is a measure of the pressure in your arteries as your heart beats. The second ("bottom") number is called the  diastolic pressure. It is a measure of the pressure in your arteries as the heart relaxes. What are the causes? The cause of this condition is not known. What increases the risk? Some risk factors for high blood pressure are under your control. Others are not. Factors you can change  Smoking.  Having type 2 diabetes mellitus, high cholesterol, or both.  Not getting enough exercise or physical activity.  Being overweight.  Having too much fat, sugar, calories, or salt (sodium) in your diet.  Drinking too much alcohol. Factors that are difficult or impossible to change  Having chronic kidney disease.  Having a family history of high blood pressure.  Age. Risk increases with age.  Race. You may be at higher risk if you are African-American.  Gender. Men are at higher risk than women before age 22. After age 46, women are at higher risk than men.  Having obstructive sleep apnea.  Stress. What are the signs or symptoms? Extremely high blood pressure (hypertensive crisis) may cause:  Headache.  Anxiety.  Shortness of breath.  Nosebleed.  Nausea and vomiting.  Severe chest pain.  Jerky movements you cannot control (seizures).  How is this diagnosed? This condition is diagnosed by  measuring your blood pressure while you are seated, with your arm resting on a surface. The cuff of the blood pressure monitor will be placed directly against the skin of your upper arm at the level of your heart. It should be measured at least twice using the same arm. Certain conditions can cause a difference in blood pressure between your right and left arms. Certain factors can cause blood pressure readings to be lower or higher than normal (elevated) for a short period of time:  When your blood pressure is higher when you are in a health care provider's office than when you are at home, this is called white coat hypertension. Most people with this condition do not need medicines.  When your blood pressure is higher at home than when you are in a health care provider's office, this is called masked hypertension. Most people with this condition may need medicines to control blood pressure.  If you have a high blood pressure reading during one visit or you have normal blood pressure with other risk factors:  You may be asked to return on a different day to have your blood pressure checked again.  You may be asked to monitor your blood pressure at home for 1 week or longer.  If you are diagnosed with hypertension, you may have other blood or imaging tests to help your health care provider understand your overall risk for other conditions. How is this treated? This condition is treated by making healthy lifestyle changes, such as eating healthy foods, exercising more, and reducing your alcohol intake. Your health care provider may prescribe medicine if lifestyle changes are not enough to get your blood pressure under control, and if:  Your systolic blood pressure is above 130.  Your diastolic blood pressure is above 80.  Your personal target blood pressure may vary depending on your medical conditions, your age, and other factors. Follow these instructions at home: Eating and drinking  Eat a  diet that is high in fiber and potassium, and low in sodium, added sugar, and fat. An example eating plan is called the Schroeder (Dietary Approaches to Stop Hypertension) diet. To eat this way: ? Eat plenty of fresh fruits and vegetables. Try to fill half of your plate at each meal with  fruits and vegetables. ? Eat whole grains, such as whole wheat pasta, brown rice, or whole grain bread. Fill about one quarter of your plate with whole grains. ? Eat or drink low-fat dairy products, such as skim milk or low-fat yogurt. ? Avoid fatty cuts of meat, processed or cured meats, and poultry with skin. Fill about one quarter of your plate with lean proteins, such as fish, chicken without skin, beans, eggs, and tofu. ? Avoid premade and processed foods. These tend to be higher in sodium, added sugar, and fat.  Reduce your daily sodium intake. Most people with hypertension should eat less than 1,500 mg of sodium a day.  Limit alcohol intake to no more than 1 drink a day for nonpregnant women and 2 drinks a day for men. One drink equals 12 oz of beer, 5 oz of wine, or 1 oz of hard liquor. Lifestyle  Work with your health care provider to maintain a healthy body weight or to lose weight. Ask what an ideal weight is for you.  Get at least 30 minutes of exercise that causes your heart to beat faster (aerobic exercise) most days of the week. Activities may include walking, swimming, or biking.  Include exercise to strengthen your muscles (resistance exercise), such as pilates or lifting weights, as part of your weekly exercise routine. Try to do these types of exercises for 30 minutes at least 3 days a week.  Do not use any products that contain nicotine or tobacco, such as cigarettes and e-cigarettes. If you need help quitting, ask your health care provider.  Monitor your blood pressure at home as told by your health care provider.  Keep all follow-up visits as told by your health care provider. This is  important. Medicines  Take over-the-counter and prescription medicines only as told by your health care provider. Follow directions carefully. Blood pressure medicines must be taken as prescribed.  Do not skip doses of blood pressure medicine. Doing this puts you at risk for problems and can make the medicine less effective.  Ask your health care provider about side effects or reactions to medicines that you should watch for. Contact a health care provider if:  You think you are having a reaction to a medicine you are taking.  You have headaches that keep coming back (recurring).  You feel dizzy.  You have swelling in your ankles.  You have trouble with your vision. Get help right away if:  You develop a severe headache or confusion.  You have unusual weakness or numbness.  You feel faint.  You have severe pain in your chest or abdomen.  You vomit repeatedly.  You have trouble breathing. Summary  Hypertension is when the force of blood pumping through your arteries is too strong. If this condition is not controlled, it may put you at risk for serious complications.  Your personal target blood pressure may vary depending on your medical conditions, your age, and other factors. For most people, a normal blood pressure is less than 120/80.  Hypertension is treated with lifestyle changes, medicines, or a combination of both. Lifestyle changes include weight loss, eating a healthy, low-sodium diet, exercising more, and limiting alcohol. This information is not intended to replace advice given to you by your health care provider. Make sure you discuss any questions you have with your health care provider. Document Released: 01/25/2005 Document Revised: 12/24/2015 Document Reviewed: 12/24/2015 Elsevier Interactive Patient Education  2018 [employer].    How to Take Your  Blood Pressure   Blood pressure is a measurement of how strongly your blood is pressing against the walls  of your arteries. Arteries are blood vessels that carry blood from your heart throughout your body. Your health care provider takes your blood pressure at each office visit. You can also take your own blood pressure at home with a blood pressure machine. You may need to take your own blood pressure:  To confirm a diagnosis of high blood pressure (hypertension).  To monitor your blood pressure over time.  To make sure your blood pressure medicine is working.  Supplies needed: To take your blood pressure, you will need a blood pressure machine. You can buy a blood pressure machine, or blood pressure monitor, at most drugstores or online. There are several types of home blood pressure monitors. When choosing one, consider the following:  Choose a monitor that has an arm cuff.  Choose a monitor that wraps snugly around your upper arm. You should be able to fit only one finger between your arm and the cuff.  Do not choose a monitor that measures your blood pressure from your wrist or finger.  Your health care provider can suggest a reliable monitor that will meet your needs. How to prepare To get the most accurate reading, avoid the following for 30 minutes before you check your blood pressure:  Drinking caffeine.  Drinking alcohol.  Eating.  Smoking.  Exercising.  Five minutes before you check your blood pressure:  Empty your bladder.  Sit quietly without talking in a dining chair, rather than in a soft couch or armchair.  How to take your blood pressure To check your blood pressure, follow the instructions in the manual that came with your blood pressure monitor. If you have a digital blood pressure monitor, the instructions may be as follows: 1. Sit up straight. 2. Place your feet on the floor. Do not cross your ankles or legs. 3. Rest your left arm at the level of your heart on a table or desk or on the arm of a chair. 4. Pull up your shirt sleeve. 5. Wrap the blood pressure  cuff around the upper part of your left arm, 1 inch (2.5 cm) above your elbow. It is best to wrap the cuff around bare skin. 6. Fit the cuff snugly around your arm. You should be able to place only one finger between the cuff and your arm. 7. Position the cord inside the groove of your elbow. 8. Press the power button. 9. Sit quietly while the cuff inflates and deflates. 10. Read the digital reading on the monitor screen and write it down (record it). 11. Wait 2-3 minutes, then repeat the steps, starting at step 1.  What does my blood pressure reading mean? A blood pressure reading consists of a higher number over a lower number. Ideally, your blood pressure should be below 120/80. The first ("top") number is called the systolic pressure. It is a measure of the pressure in your arteries as your heart beats. The second ("bottom") number is called the diastolic pressure. It is a measure of the pressure in your arteries as the heart relaxes. Blood pressure is classified into four stages. The following are the stages for adults who do not have a short-term serious illness or a chronic condition. Systolic pressure and diastolic pressure are measured in a unit called mm Hg. Normal  Systolic pressure: below 485.  Diastolic pressure: below 80. Elevated  Systolic pressure: 462-703.  Diastolic pressure:  below 80. Hypertension stage 1  Systolic pressure: 588-502.  Diastolic pressure: 77-41. Hypertension stage 2  Systolic pressure: 287 or above.  Diastolic pressure: 90 or above. You can have prehypertension or hypertension even if only the systolic or only the diastolic number in your reading is higher than normal. Follow these instructions at home:  Check your blood pressure as often as recommended by your health care provider.  Take your monitor to the next appointment with your health care provider to make sure: ? That you are using it correctly. ? That it provides accurate  readings.  Be sure you understand what your goal blood pressure numbers are.  Tell your health care provider if you are having any side effects from blood pressure medicine. Contact a health care provider if:  Your blood pressure is consistently high. Get help right away if:  Your systolic blood pressure is higher than 180.  Your diastolic blood pressure is higher than 110. This information is not intended to replace advice given to you by your health care provider. Make sure you discuss any questions you have with your health care provider. Document Released: 07/04/2015 Document Revised: 09/16/2015 Document Reviewed: 07/04/2015 Elsevier Interactive Patient Education  [employer].

## 2018-01-18 ENCOUNTER — Other Ambulatory Visit: Payer: Self-pay | Admitting: Family Medicine

## 2018-01-18 DIAGNOSIS — G43809 Other migraine, not intractable, without status migrainosus: Secondary | ICD-10-CM

## 2018-01-19 IMAGING — US US SCROTUM
1 series · 14 of 25 positions shown · non-contrast
Comparison: CT 02/26/2015.

CLINICAL DATA: Right scrotal pain x1 month.

EXAM:
SCROTAL ULTRASOUND
DOPPLER ULTRASOUND OF THE TESTICLES
TECHNIQUE: Complete ultrasound examination of the testicles, epididymis, and
other scrotal structures was performed. Color and spectral Doppler
ultrasound were also utilized to evaluate blood flow to the
testicles.

[Series 1: us scrotum · 0.07mm/px · 14 of 72 slices shown]
[im 1/72]
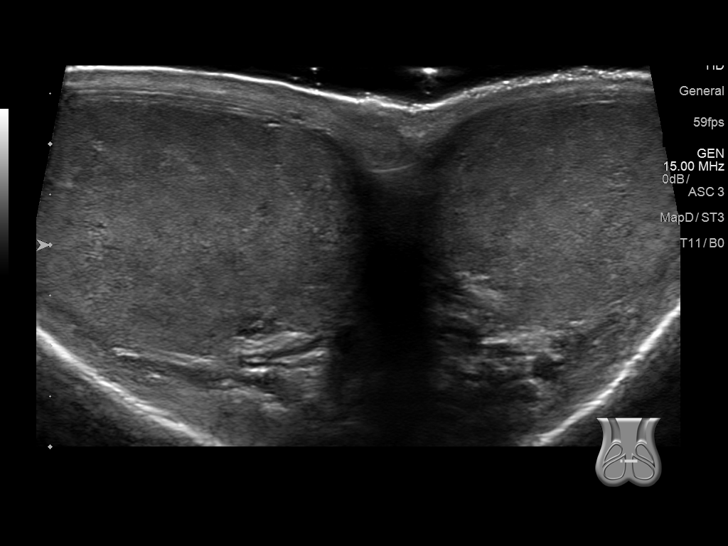
[im 6/72]
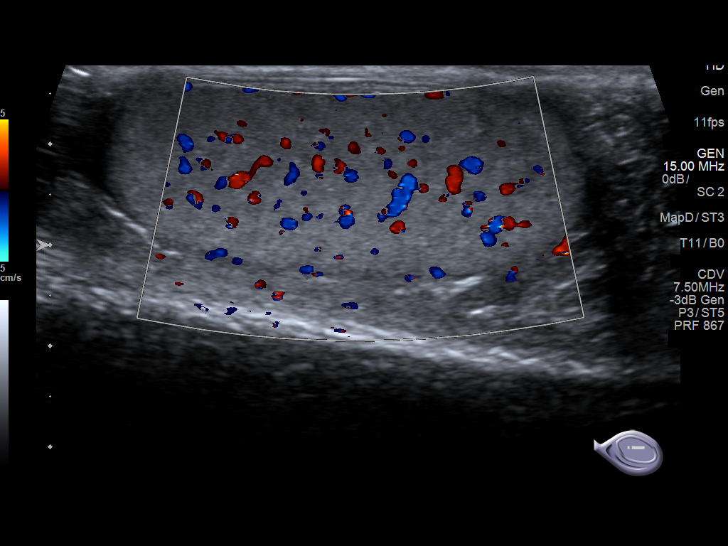
[im 12/72]
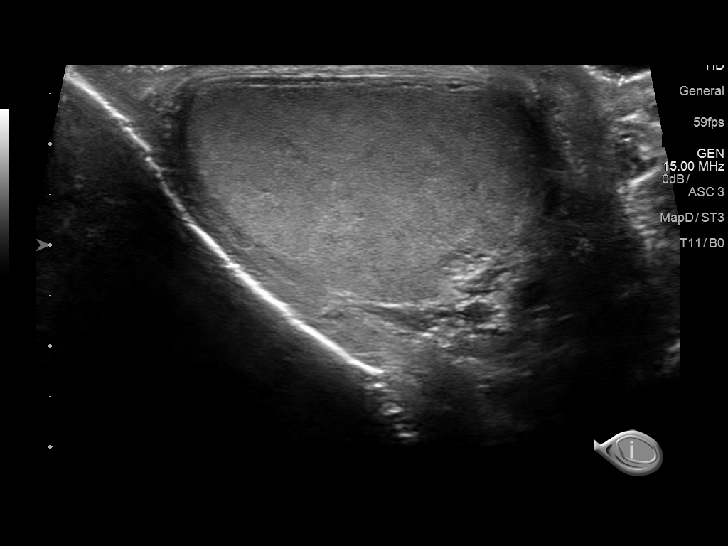
[im 18/72]
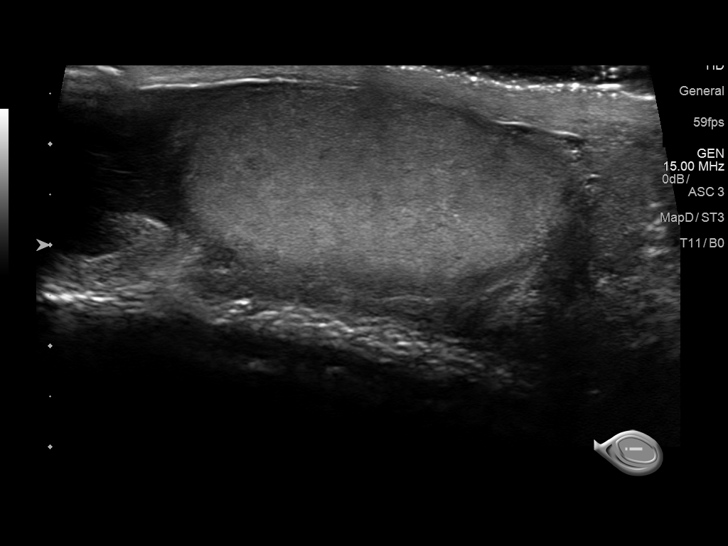
[im 24/72]
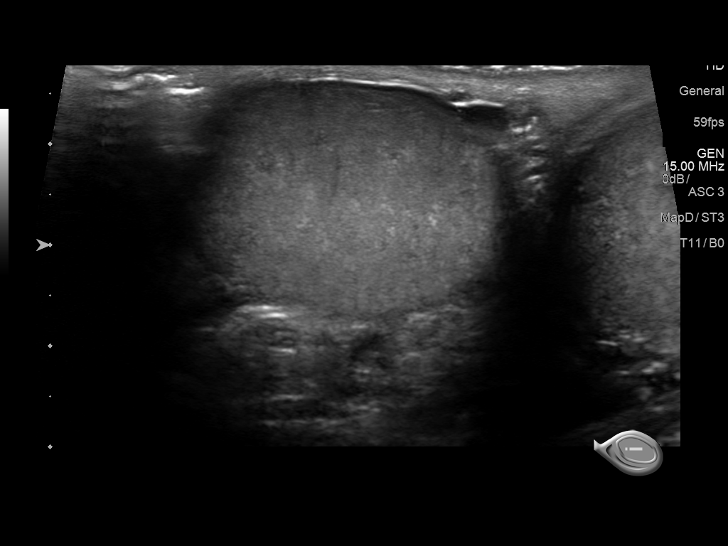
[im 27/72]
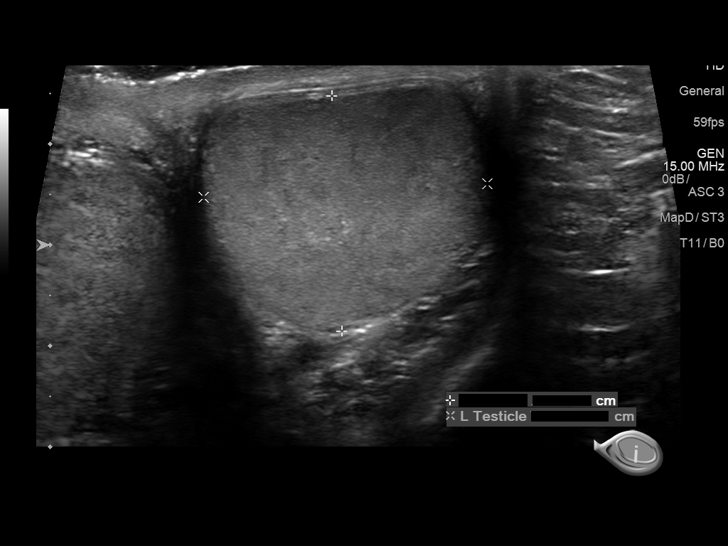
[im 33/72]
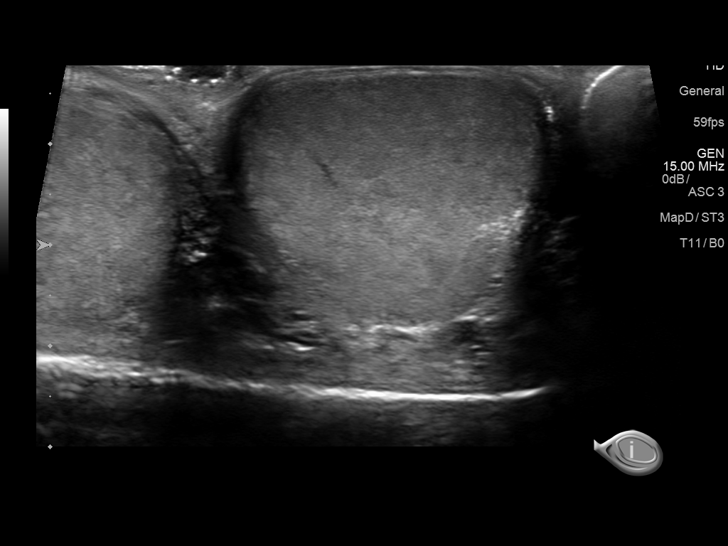
[im 39/72]
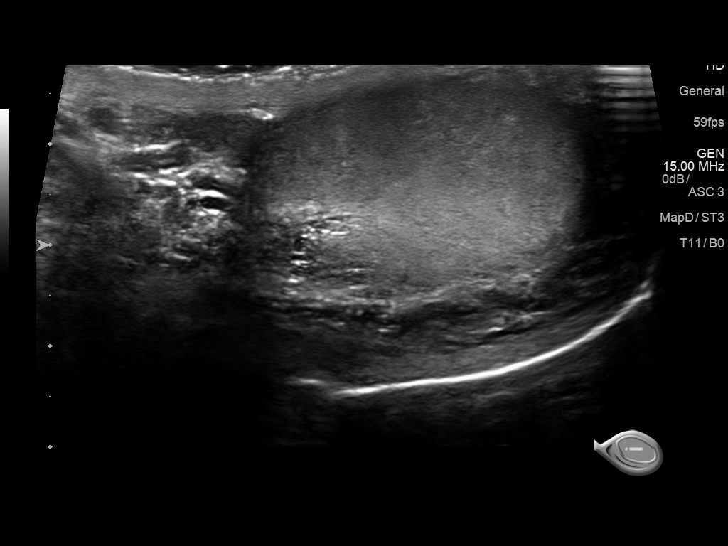
[im 45/72]
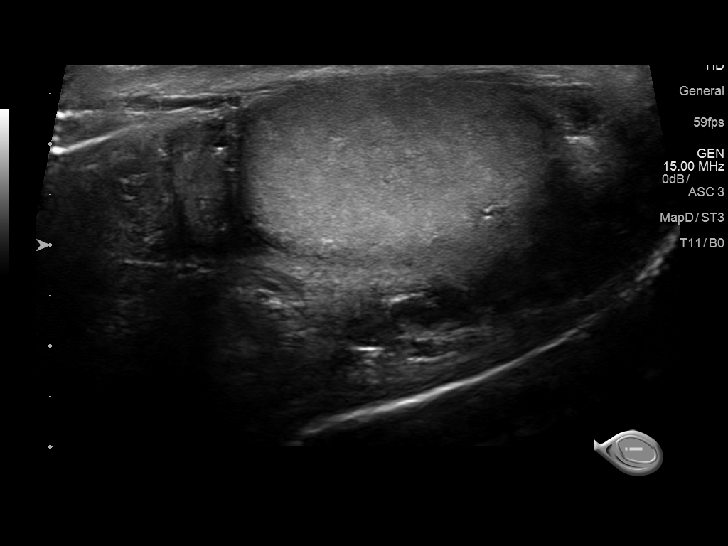
[im 48/72]
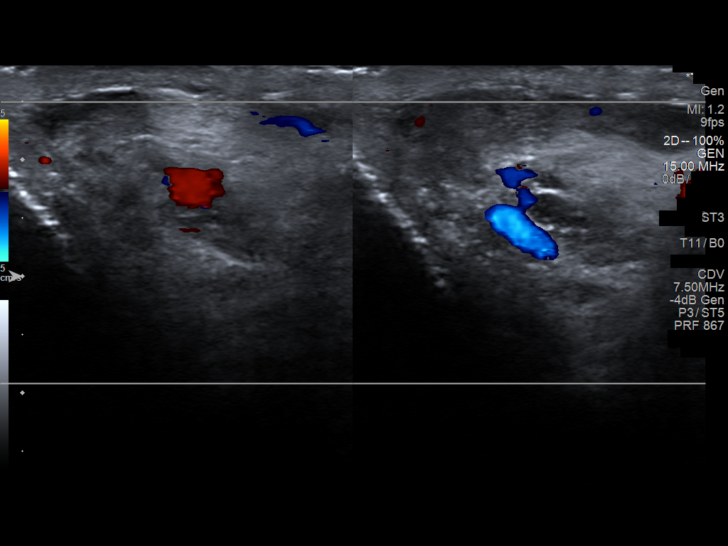
[im 54/72]
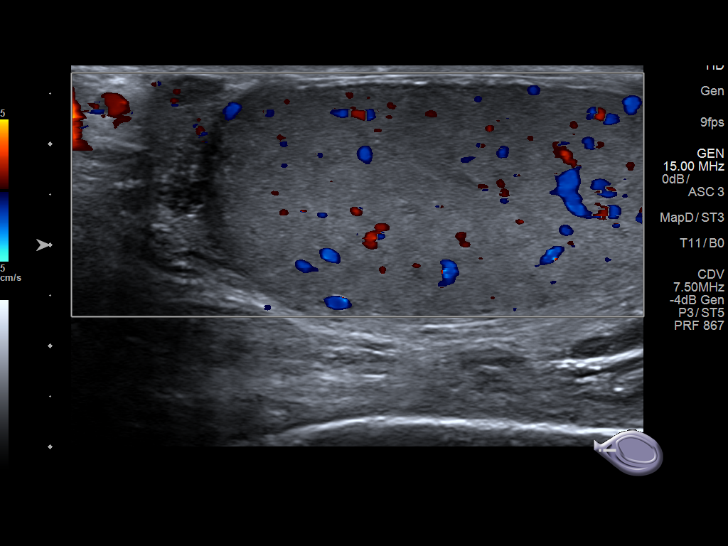
[im 60/72]
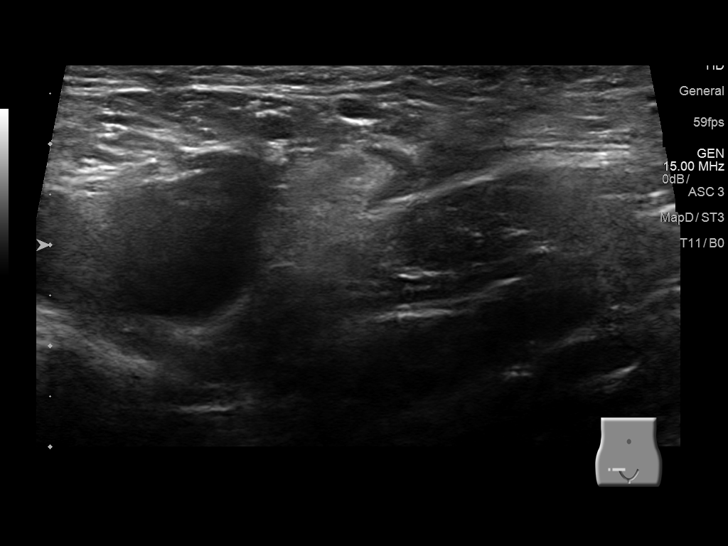
[im 66/72]
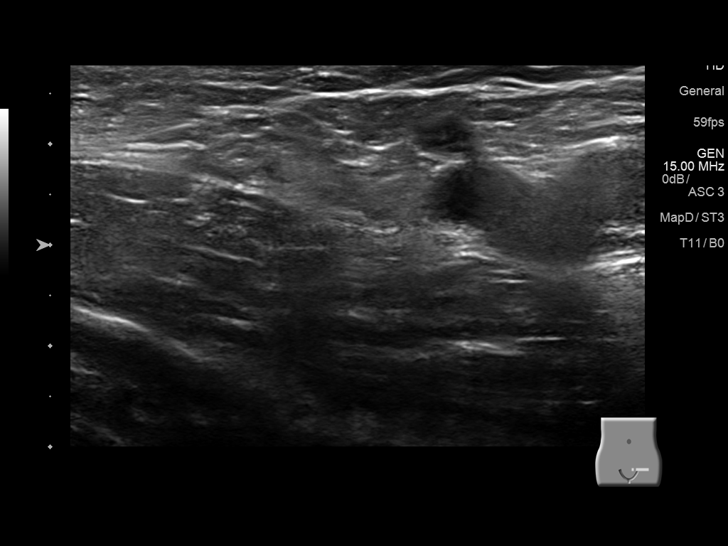
[im 72/72]
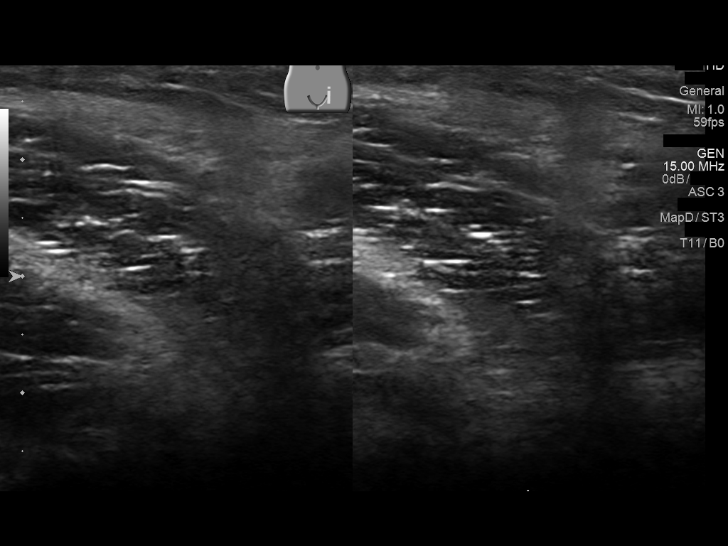

[14 of 25 positions shown; findings below may reference images not displayed]

FINDINGS: Right testicle

Measurements: 4.9 x 2.2 x 3.6 cm. No mass or microlithiasis
visualized.

Left testicle

Measurements: 4.9 x 2.3 x 2.8 cm. No mass or microlithiasis
visualized.

Right epididymis:  Normal in size and appearance.

Left epididymis:  Normal in size and appearance.

Hydrocele:  None visualized.

Varicocele:  None visualized.

Pulsed Doppler interrogation of both testes demonstrates normal low
resistance arterial and venous waveforms bilaterally.
IMPRESSION: Negative exam.

## 2018-02-07 ENCOUNTER — Other Ambulatory Visit: Payer: Self-pay | Admitting: Family Medicine

## 2018-02-07 DIAGNOSIS — G43809 Other migraine, not intractable, without status migrainosus: Secondary | ICD-10-CM

## 2018-03-02 ENCOUNTER — Encounter: Payer: Self-pay | Admitting: Family Medicine

## 2018-03-02 ENCOUNTER — Other Ambulatory Visit: Payer: Self-pay

## 2018-03-02 ENCOUNTER — Ambulatory Visit (INDEPENDENT_AMBULATORY_CARE_PROVIDER_SITE_OTHER): Payer: BLUE CROSS/BLUE SHIELD | Admitting: Family Medicine

## 2018-03-02 ENCOUNTER — Other Ambulatory Visit (INDEPENDENT_AMBULATORY_CARE_PROVIDER_SITE_OTHER): Payer: BLUE CROSS/BLUE SHIELD

## 2018-03-02 VITALS — BP 120/76 | HR 57 | Temp 98.0°F | Ht 69.0 in | Wt 190.0 lb

## 2018-03-02 DIAGNOSIS — E559 Vitamin D deficiency, unspecified: Secondary | ICD-10-CM

## 2018-03-02 DIAGNOSIS — Z0000 Encounter for general adult medical examination without abnormal findings: Secondary | ICD-10-CM

## 2018-03-02 DIAGNOSIS — Z862 Personal history of diseases of the blood and blood-forming organs and certain disorders involving the immune mechanism: Secondary | ICD-10-CM | POA: Diagnosis not present

## 2018-03-02 DIAGNOSIS — I10 Essential (primary) hypertension: Secondary | ICD-10-CM

## 2018-03-02 DIAGNOSIS — E663 Overweight: Secondary | ICD-10-CM

## 2018-03-02 DIAGNOSIS — Z719 Counseling, unspecified: Secondary | ICD-10-CM | POA: Diagnosis not present

## 2018-03-02 NOTE — Patient Instructions (Addendum)
Great job on your lifestyle changes Paul Schroeder!!!  So proud to you.  Keep up the good work!!   Preventive Care for Adults, Male A healthy lifestyle and preventive care can promote health and wellness. Preventive health guidelines for men include the following key practices:  A routine yearly physical is a good way to check with your health care provider about your health and preventative screening. It is a chance to share any concerns and updates on your health and to receive a thorough exam.  Visit your dentist for a routine exam and preventative care every 6 months. Brush your teeth twice a day and floss once a day. Good oral hygiene prevents tooth decay and gum disease.  The frequency of eye exams is based on your age, health, family medical history, use of contact lenses, and other factors. Follow your health care provider's recommendations for frequency of eye exams.  Eat a healthy diet. Foods such as vegetables, fruits, whole grains, low-fat dairy products, and lean protein foods contain the nutrients you need without too many calories. Decrease your intake of foods high in solid fats, added sugars, and salt. Eat the right amount of calories for you. Get information about a proper diet from your health care provider, if necessary.  Regular physical exercise is one of the most important things you can do for your health. Most adults should get at least 150 minutes of moderate-intensity exercise (any activity that increases your heart rate and causes you to sweat) each week. In addition, most adults need muscle-strengthening exercises on 2 or more days a week.  Maintain a healthy weight. The body mass index (BMI) is a screening tool to identify possible weight problems. It provides an estimate of body fat based on height and weight. Your health care provider can find your BMI and can help you achieve or maintain a healthy weight. For adults 20 years and older:  A BMI below 18.5 is considered  underweight.  A BMI of 18.5 to 24.9 is normal.  A BMI of 25 to 29.9 is considered overweight.  A BMI of 30 and above is considered obese.  Maintain normal blood lipids and cholesterol levels by exercising and minimizing your intake of saturated fat. Eat a balanced diet with plenty of fruit and vegetables. Blood tests for lipids and cholesterol should begin at age 49 and be repeated every 5 years. If your lipid or cholesterol levels are high, you are over 50, or you are at high risk for heart disease, you may need your cholesterol levels checked more frequently. Ongoing high lipid and cholesterol levels should be treated with medicines if diet and exercise are not working.  If you smoke, find out from your health care provider how to quit. If you do not use tobacco, do not start.  Lung cancer screening is recommended for adults aged 87-80 years who are at high risk for developing lung cancer because of a history of smoking. A yearly low-dose CT scan of the lungs is recommended for people who have at least a 30-pack-year history of smoking and are a current smoker or have quit within the past 15 years. A pack year of smoking is smoking an average of 1 pack of cigarettes a day for 1 year (for example: 1 pack a day for 30 years or 2 packs a day for 15 years). Yearly screening should continue until the smoker has stopped smoking for at least 15 years. Yearly screening should be stopped for people who develop  a health problem that would prevent them from having lung cancer treatment.  If you choose to drink alcohol, do not have more than 2 drinks per day. One drink is considered to be 12 ounces (355 mL) of beer, 5 ounces (148 mL) of wine, or 1.5 ounces (44 mL) of liquor.  Avoid use of street drugs. Do not share needles with anyone. Ask for help if you need support or instructions about stopping the use of drugs.  High blood pressure causes heart disease and increases the risk of stroke. Your blood  pressure should be checked at least every 1-2 years. Ongoing high blood pressure should be treated with medicines, if weight loss and exercise are not effective.  If you are 66-57 years old, ask your health care provider if you should take aspirin to prevent heart disease.  Diabetes screening is done by taking a blood sample to check your blood glucose level after you have not eaten for a certain period of time (fasting). If you are not overweight and you do not have risk factors for diabetes, you should be screened once every 3 years starting at age 46. If you are overweight or obese and you are 52-5 years of age, you should be screened for diabetes every year as part of your cardiovascular risk assessment.  Colorectal cancer can be detected and often prevented. Most routine colorectal cancer screening begins at the age of 48 and continues through age 41. However, your health care provider may recommend screening at an earlier age if you have risk factors for colon cancer. On a yearly basis, your health care provider may provide home test kits to check for hidden blood in the stool. Use of a small camera at the end of a tube to directly examine the colon (sigmoidoscopy or colonoscopy) can detect the earliest forms of colorectal cancer. Talk to your health care provider about this at age 87, when routine screening begins. Direct exam of the colon should be repeated every 5-10 years through age 34, unless early forms of precancerous polyps or small growths are found.  People who are at an increased risk for hepatitis B should be screened for this virus. You are considered at high risk for hepatitis B if:  You were born in a country where hepatitis B occurs often. Talk with your health care provider about which countries are considered high risk.  Your parents were born in a high-risk country and you have not received a shot to protect against hepatitis B (hepatitis B vaccine).  You have HIV or  AIDS.  You use needles to inject street drugs.  You live with, or have sex with, someone who has hepatitis B.  You are a man who has sex with other men (MSM).  You get hemodialysis treatment.  You take certain medicines for conditions such as cancer, organ transplantation, and autoimmune conditions.  Hepatitis C blood testing is recommended for all people born from [birth year] through 1965 and any individual with known risks for hepatitis C.  Practice safe sex. Use condoms and avoid high-risk sexual practices to reduce the spread of sexually transmitted infections (STIs). STIs include gonorrhea, chlamydia, syphilis, trichomonas, herpes, HPV, and human immunodeficiency virus (HIV). Herpes, HIV, and HPV are viral illnesses that have no cure. They can result in disability, cancer, and death.  If you are a man who has sex with other men, you should be screened at least once per year for:  HIV.  Urethral, rectal, and pharyngeal infection  of gonorrhea, chlamydia, or both.  If you are at risk of being infected with HIV, it is recommended that you take a prescription medicine daily to prevent HIV infection. This is called preexposure prophylaxis (PrEP). You are considered at risk if:  You are a man who has sex with other men (MSM) and have other risk factors.  You are a heterosexual man, are sexually active, and are at increased risk for HIV infection.  You take drugs by injection.  You are sexually active with a partner who has HIV.  Talk with your health care provider about whether you are at high risk of being infected with HIV. If you choose to begin PrEP, you should first be tested for HIV. You should then be tested every 3 months for as long as you are taking PrEP.  A one-time screening for abdominal aortic aneurysm (AAA) and surgical repair of large AAAs by ultrasound are recommended for men ages 68 to 83 years who are current or former smokers.  Healthy men should no longer receive  prostate-specific antigen (PSA) blood tests as part of routine cancer screening. Talk with your health care provider about prostate cancer screening.  Testicular cancer screening is not recommended for adult males who have no symptoms. Screening includes self-exam, a health care provider exam, and other screening tests. Consult with your health care provider about any symptoms you have or any concerns you have about testicular cancer.  Use sunscreen. Apply sunscreen liberally and repeatedly throughout the day. You should seek shade when your shadow is shorter than you. Protect yourself by wearing long sleeves, pants, a wide-brimmed hat, and sunglasses year round, whenever you are outdoors.  Once a month, do a whole-body skin exam, using a mirror to look at the skin on your back. Tell your health care provider about new moles, moles that have irregular borders, moles that are larger than a pencil eraser, or moles that have changed in shape or color.  Stay current with required vaccines (immunizations).  Influenza vaccine. All adults should be immunized every year.  Tetanus, diphtheria, and acellular pertussis (Td, Tdap) vaccine. An adult who has not previously received Tdap or who does not know his vaccine status should receive 1 dose of Tdap. This initial dose should be followed by tetanus and diphtheria toxoids (Td) booster doses every 10 years. Adults with an unknown or incomplete history of completing a 3-dose immunization series with Td-containing vaccines should begin or complete a primary immunization series including a Tdap dose. Adults should receive a Td booster every 10 years.  Varicella vaccine. An adult without evidence of immunity to varicella should receive 2 doses or a second dose if he has previously received 1 dose.  Human papillomavirus (HPV) vaccine. Males aged 11-21 years who have not received the vaccine previously should receive the 3-dose series. Males aged 22-26 years may be  immunized. Immunization is recommended through the age of 58 years for any male who has sex with males and did not get any or all doses earlier. Immunization is recommended for any person with an immunocompromised condition through the age of 39 years if he did not get any or all doses earlier. During the 3-dose series, the second dose should be obtained 4-8 weeks after the first dose. The third dose should be obtained 24 weeks after the first dose and 16 weeks after the second dose.  Zoster vaccine. One dose is recommended for adults aged 71 years or older unless certain conditions are present.  Measles, mumps, and rubella (MMR) vaccine. Adults born before [birth year] generally are considered immune to measles and mumps. Adults born in [birth year] or later should have 1 or more doses of MMR vaccine unless there is a contraindication to the vaccine or there is laboratory evidence of immunity to each of the three diseases. A routine second dose of MMR vaccine should be obtained at least 28 days after the first dose for students attending postsecondary schools, health care workers, or international travelers. People who received inactivated measles vaccine or an unknown type of measles vaccine during 1963-1967 should receive 2 doses of MMR vaccine. People who received inactivated mumps vaccine or an unknown type of mumps vaccine before 1979 and are at high risk for mumps infection should consider immunization with 2 doses of MMR vaccine. Unvaccinated health care workers born before [birth year] who lack laboratory evidence of measles, mumps, or rubella immunity or laboratory confirmation of disease should consider measles and mumps immunization with 2 doses of MMR vaccine or rubella immunization with 1 dose of MMR vaccine.  Pneumococcal 13-valent conjugate (PCV13) vaccine. When indicated, a person who is uncertain of his immunization history and has no record of immunization should receive the PCV13 vaccine. All adults 87 years of  age and older should receive this vaccine. An adult aged 4 years or older who has certain medical conditions and has not been previously immunized should receive 1 dose of PCV13 vaccine. This PCV13 should be followed with a dose of pneumococcal polysaccharide (PPSV23) vaccine. Adults who are at high risk for pneumococcal disease should obtain the PPSV23 vaccine at least 8 weeks after the dose of PCV13 vaccine. Adults older than 48 years of age who have normal immune system function should obtain the PPSV23 vaccine dose at least 1 year after the dose of PCV13 vaccine.  Pneumococcal polysaccharide (PPSV23) vaccine. When PCV13 is also indicated, PCV13 should be obtained first. All adults aged 50 years and older should be immunized. An adult younger than age 24 years who has certain medical conditions should be immunized. Any person who resides in a nursing home or long-term care facility should be immunized. An adult smoker should be immunized. People with an immunocompromised condition and certain other conditions should receive both PCV13 and PPSV23 vaccines. People with human immunodeficiency virus (HIV) infection should be immunized as soon as possible after diagnosis. Immunization during chemotherapy or radiation therapy should be avoided. Routine use of PPSV23 vaccine is not recommended for American Indians, [place]atives, or people younger than 65 years unless there are medical conditions that require PPSV23 vaccine. When indicated, people who have unknown immunization and have no record of immunization should receive PPSV23 vaccine. One-time revaccination 5 years after the first dose of PPSV23 is recommended for people aged 19-64 years who have chronic kidney failure, nephrotic syndrome, asplenia, or immunocompromised conditions. People who received 1-2 doses of PPSV23 before age 60 years should receive another dose of PPSV23 vaccine at age 77 years or later if at least 5 years have passed since the  previous dose. Doses of PPSV23 are not needed for people immunized with PPSV23 at or after age 51 years.  Meningococcal vaccine. Adults with asplenia or persistent complement component deficiencies should receive 2 doses of quadrivalent meningococcal conjugate (MenACWY-D) vaccine. The doses should be obtained at least 2 months apart. Microbiologists working with certain meningococcal bacteria, [place] recruits, people at risk during an outbreak, and people who travel to or live in countries with a high rate of meningitis  should be immunized. A first-year college student up through age 52 years who is living in a residence hall should receive a dose if he did not receive a dose on or after his 16th birthday. Adults who have certain high-risk conditions should receive one or more doses of vaccine.  Hepatitis A vaccine. Adults who wish to be protected from this disease, have chronic liver disease, work with hepatitis A-infected animals, work in hepatitis A research labs, or travel to or work in countries with a high rate of hepatitis A should be immunized. Adults who were previously unvaccinated and who anticipate close contact with an international adoptee during the first 60 days after arrival in the [place] from a country with a high rate of hepatitis A should be immunized.  Hepatitis B vaccine. Adults should be immunized if they wish to be protected from this disease, are under age 70 years and have diabetes, have chronic liver disease, have had more than one sex partner in the past 6 months, may be exposed to blood or other infectious body fluids, are household contacts or sex partners of hepatitis B positive people, are clients or workers in certain care facilities, or travel to or work in countries with a high rate of hepatitis B.  Haemophilus influenzae type b (Hib) vaccine. A previously unvaccinated person with asplenia or sickle cell disease or having a scheduled splenectomy should receive 1  dose of Hib vaccine. Regardless of previous immunization, a recipient of a hematopoietic stem cell transplant should receive a 3-dose series 6-12 months after his successful transplant. Hib vaccine is not recommended for adults with HIV infection. Preventive Service / Frequency Ages 13 to 70  Blood pressure check.** / Every 3-5 years.  Lipid and cholesterol check.** / Every 5 years beginning at age 25.  Hepatitis C blood test.** / For any individual with known risks for hepatitis C.  Skin self-exam. / Monthly.  Influenza vaccine. / Every year.  Tetanus, diphtheria, and acellular pertussis (Tdap, Td) vaccine.** / Consult your health care provider. 1 dose of Td every 10 years.  Varicella vaccine.** / Consult your health care provider.  HPV vaccine. / 3 doses over 6 months, if 34 or younger.  Measles, mumps, rubella (MMR) vaccine.** / You need at least 1 dose of MMR if you were born in 1957 or later. You may also need a second dose.  Pneumococcal 13-valent conjugate (PCV13) vaccine.** / Consult your health care provider.  Pneumococcal polysaccharide (PPSV23) vaccine.** / 1 to 2 doses if you smoke cigarettes or if you have certain conditions.  Meningococcal vaccine.** / 1 dose if you are age 70 to 13 years and a Market researcher living in a residence hall, or have one of several medical conditions. You may also need additional booster doses.  Hepatitis A vaccine.** / Consult your health care provider.  Hepatitis B vaccine.** / Consult your health care provider.  Haemophilus influenzae type b (Hib) vaccine.** / Consult your health care provider. Ages 36 to 71  Blood pressure check.** / Every year.  Lipid and cholesterol check.** / Every 5 years beginning at age 41.  Lung cancer screening. / Every year if you are aged 88-80 years and have a 30-pack-year history of smoking and currently smoke or have quit within the past 15 years. Yearly screening is stopped once you have  quit smoking for at least 15 years or develop a health problem that would prevent you from having lung cancer treatment.  Fecal occult blood  test (FOBT) of stool. / Every year beginning at age 80 and continuing until age 72. You may not have to do this test if you get a colonoscopy every 10 years.  Flexible sigmoidoscopy** or colonoscopy.** / Every 5 years for a flexible sigmoidoscopy or every 10 years for a colonoscopy beginning at age 19 and continuing until age 42.  Hepatitis C blood test.** / For all people born from [birth year] through 1965 and any individual with known risks for hepatitis C.  Skin self-exam. / Monthly.  Influenza vaccine. / Every year.  Tetanus, diphtheria, and acellular pertussis (Tdap/Td) vaccine.** / Consult your health care provider. 1 dose of Td every 10 years.  Varicella vaccine.** / Consult your health care provider.  Zoster vaccine.** / 1 dose for adults aged 29 years or older.  Measles, mumps, rubella (MMR) vaccine.** / You need at least 1 dose of MMR if you were born in 1957 or later. You may also need a second dose.  Pneumococcal 13-valent conjugate (PCV13) vaccine.** / Consult your health care provider.  Pneumococcal polysaccharide (PPSV23) vaccine.** / 1 to 2 doses if you smoke cigarettes or if you have certain conditions.  Meningococcal vaccine.** / Consult your health care provider.  Hepatitis A vaccine.** / Consult your health care provider.  Hepatitis B vaccine.** / Consult your health care provider.  Haemophilus influenzae type b (Hib) vaccine.** / Consult your health care provider. Ages 38 and over  Blood pressure check.** / Every year.  Lipid and cholesterol check.**/ Every 5 years beginning at age 52.  Lung cancer screening. / Every year if you are aged 59-80 years and have a 30-pack-year history of smoking and currently smoke or have quit within the past 15 years. Yearly screening is stopped once you have quit smoking for at least 15 years or  develop a health problem that would prevent you from having lung cancer treatment.  Fecal occult blood test (FOBT) of stool. / Every year beginning at age 8 and continuing until age 49. You may not have to do this test if you get a colonoscopy every 10 years.  Flexible sigmoidoscopy** or colonoscopy.** / Every 5 years for a flexible sigmoidoscopy or every 10 years for a colonoscopy beginning at age 26 and continuing until age 75.  Hepatitis C blood test.** / For all people born from [birth year] through 1965 and any individual with known risks for hepatitis C.  Abdominal aortic aneurysm (AAA) screening.** / A one-time screening for ages 57 to 65 years who are current or former smokers.  Skin self-exam. / Monthly.  Influenza vaccine. / Every year.  Tetanus, diphtheria, and acellular pertussis (Tdap/Td) vaccine.** / 1 dose of Td every 10 years.  Varicella vaccine.** / Consult your health care provider.  Zoster vaccine.** / 1 dose for adults aged 65 years or older.  Pneumococcal 13-valent conjugate (PCV13) vaccine.** / 1 dose for all adults aged 102 years and older.  Pneumococcal polysaccharide (PPSV23) vaccine.** / 1 dose for all adults aged 77 years and older.  Meningococcal vaccine.** / Consult your health care provider.  Hepatitis A vaccine.** / Consult your health care provider.  Hepatitis B vaccine.** / Consult your health care provider.  Haemophilus influenzae type b (Hib) vaccine.** / Consult your health care provider. **Family history and personal history of risk and conditions may change your health care provider's recommendations.   This information is not intended to replace advice given to you by your health care provider. Make sure you discuss any questions you  have with your health care provider.   Document Released: 03/23/2001 Document Revised: 02/15/2014 Document Reviewed: 06/22/2010 [employer]vier Interactive Patient Education [employer].

## 2018-03-02 NOTE — Progress Notes (Signed)
Male physical  Impression and Recommendations:    1. Encounter for wellness examination   2. Health education/counseling   3.  (BMI 25.0-29.9)      1) Anticipatory Guidance: Discussed importance of wearing a seatbelt while driving, not texting while driving;   sunscreen when outside along with skin surveillance; eating a balanced and modest diet; physical activity at least 25 minutes per day or 150 min/ week moderate to intense activity.  - Discussed prudent home skin screening habits and reviewed recommendations with patient today.  2) Immunizations / Screenings / Labs:  All immunizations are up-to-date per recommendations or will be updated today. Patient is due for dental and vision screens which pt will schedule independently. Will obtain CBC, CMP, HgA1c, Lipid panel, TSH and vit D when fasting, if not already done recently.   - Reviewed immunization guidelines in detail with patient.  - Need for flu vaccine.  Patient declines.  - Patent has had HIV Hepatitis C screening in the past.  3) Weight:   BMI meaning discussed with patient.  Discussed goal of losing 5-10% of current body weight which would improve overall feelings of well being and improve objective health data. Improve nutrient density of diet through increasing intake of fruits and vegetables and decreasing saturated fats, white flour products and refined sugars.   4) BMI Counseling - BMI of 28.06: Explained to patient what BMI refers to, and what it means medically.    Told patient to think about it as a "medical risk stratification measurement" and how increasing BMI is associated with increasing risk/ or worsening state of various diseases such as hypertension, hyperlipidemia, diabetes, premature OA, depression etc.  American Heart Association guidelines for healthy diet, basically Mediterranean diet, and exercise guidelines of 30 minutes 5 days per week or more discussed in detail.  Health counseling  performed.  All questions answered.  5) Lifestyle & Preventative Health Maintenance: - Advised patient to continue working toward exercising to improve overall mental, physical, and emotional health.    - Reviewed the "spokes of the wheel" of mood and health management.  Stressed the importance of ongoing prudent habits, including regular exercise, appropriate sleep hygiene, healthful dietary habits, and prayer/meditation to relax.  - Encouraged patient to continue engaging in daily physical activity, especially a formal exercise routine.  Recommended that the patient eventually strive for at least 150 minutes of moderate cardiovascular activity per week according to guidelines established by the [facility].   - Healthy dietary habits encouraged, including low-carb, and high amounts of lean protein in diet.   - Patient should also consume adequate amounts of water.   Gross side effects, risk and benefits, and alternatives of medications discussed with patient.  Patient is aware that all medications have potential side effects and we are unable to predict every side effect or drug-drug interaction that may occur.  Expresses verbal understanding and consents to current therapy plan and treatment regimen.  Please see AVS handed out to patient at the end of our visit for further patient instructions/ counseling done pertaining to today's office visit.  Follow-up preventative CPE in 1 year. Follow-up office visit pending lab work.  F/up sooner for chronic care management and/or prn  This document serves as a record of services personally performed by Mellody Dance, DO. It was created on her behalf by Toni Amend, a trained medical scribe. The creation of this record is based on the scribe's personal observations and the provider's statements to them.  I have reviewed the above medical documentation for accuracy and completeness and I concur.  Mellody Dance, DO 03/05/2018 9:22  PM       Subjective:    CC: CPE  HPI: Rein Popov is a 48 y.o. male who presents to [place] [facility] today for a yearly health maintenance exam.    Health Maintenance Summary Reviewed and updated, unless pt declines services.  Tobacco History Reviewed:  Patient has completely quit smoking and vaping, cold [place] as of November 2019.  Still has the habit sometimes where he reaches for something in his pocket, but otherwise denies cravings or desiring assistance with quitting. Alcohol:  No concerns, no excessive use. Exercise Habits:  Not meeting AHA guidelines. STD concerns:  None; monogamous with partner. Drug Use:  None. Birth control method:  N/a. Testicular/penile concerns:  Denies concerns. Denies frequent urination at night or concerns with urinary stream.  No first-degree relative family history of colon cancer.  Eye Health Patient confirms that he has had a dilated eye exam once in the last few years.  Lifestyle Changes - DASH Diet & Exercise Patient has transitioned to [place] recently.  He is enjoying this change.  He is avoiding salt and pre-packaged foods, and has lost about a pound so far.  His headaches have improved, and he is sleeping more soundly.  Continues doing 30 minutes of walking per day, most days, and practices inner core diaphragm exercises that he was taught during physical therapy.  Blood Pressure Voluntarily reports today that he is checking his blood pressure daily.  Last week, his blood pressure was, on average, 128/87 with resting heart rate of 60.  Stress Relief Implements meditation and breathing exercises in daily life to relieve stress.    Immunization History  Administered Date(s) Administered  . Tdap 05/25/2011    Health Maintenance  Topic Date Due  . INFLUENZA VACCINE  01/12/2019 (Originally 09/08/2017)  . TETANUS/TDAP  05/24/2021  . HIV Screening  Completed      Wt Readings from Last 3  Encounters:  03/02/18 190 lb (86.2 kg)  01/13/18 195 lb (88.5 kg)  08/26/17 179 lb 12.8 oz (81.6 kg)   BP Readings from Last 3 Encounters:  03/02/18 120/76  01/13/18 137/89  08/26/17 123/76   Pulse Readings from Last 3 Encounters:  03/02/18 (!) 57  01/13/18 63  08/26/17 65    Patient Active Problem List   Diagnosis Date Noted  . HTN (hypertension) 08/12/2016    Priority: High  . Tobacco use disorder- approximately 28-pack-year history- NOW quit even vaping! 08/12/2016    Priority: High  . History of panic attacks 08/12/2016    Priority: High  . Adjustment disorder with mixed anxiety and depressed mood- since teenager 08/12/2016    Priority: High  . Gastroesophageal reflux disease 09/10/2016    Priority: Medium  . 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 )  08/12/2016    Priority: Medium  . Pelvic floor dysfunction- treated by urology 04/22/2015    Priority: Medium  . Environmental and seasonal allergies 08/12/2016    Priority: Low  . Seasonal allergic rhinitis 08/12/2016    Priority: Low  . H/O non anemic vitamin B12 deficiency 08/12/2016    Priority: Low  . H/O iron deficiency anemia 08/12/2016    Priority: Low  . Encounter for counseling for tobacco use disorder 08/12/2016    Priority: Low  . Family history of mood disorder- MOM 08/12/2016  Priority: Low  .  (BMI 25.0-29.9) 03/02/2018  . 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 Insomnia 01/13/2018  . Medication monitoring encounter 07/13/2017  . Family history of hyperlipidemia 07/13/2017  . Health education/counseling 07/13/2017  . Erectile dysfunction 02/25/2017  . Vitamin D insufficiency 02/25/2017  . Anxious mood 09/10/2016  . HTN, goal below 130/80 09/10/2016  . 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 Melanoma of skin ([place]) 08/12/2016  . Dysuria 10/07/2015  . Right groin pain 03/05/2015  . Hematospermia 02/22/2015  . Epididymitis, right 02/22/2015    Past Medical History:  Diagnosis Date  .  Allergy   . Anxiety   . Bronchitis   . Cancer [facility]) 2013   Malignant mole on his back removed   . Hematospermia   . Hemorrhoids   . Migraines    family Hx, triggered by stress  . Seasonal allergies     Past Surgical History:  Procedure Laterality Date  . skin lesions removed      Family History  Problem Relation Age of Onset  . Hypertension Mother   . Hyperlipidemia Mother   . Stroke Mother   . Depression Mother   . Kidney disease Brother   . Kidney disease Maternal Grandfather   . Heart disease Maternal Grandfather   . Prostate cancer Neg Hx     Social History   Substance and Sexual Activity  Drug Use Yes  . Frequency: 2.0 times per week   Comment: marijuana  ,  Social History   Substance and Sexual Activity  Alcohol Use Yes  . Alcohol/week: 4.0 standard drinks  . Types: 4 Shots of liquor per week  ,  Social History   Tobacco Use  Smoking Status Former Smoker  . Packs/day: 1.00  . Years: 3.00  . Pack years: 3.00  . Types: Cigarettes  . Last attempt to quit: 12/2017  . Years since quitting: 0.2  Smokeless Tobacco Never Used  ,  Social History   Substance and Sexual Activity  Sexual Activity Not on file    Patient's Medications  New Prescriptions   No medications on file  Previous Medications   AMLODIPINE-VALSARTAN-HCTZ 5-160-12.5 MG TABS    TAKE 1 TABLET DAILY   BACLOFEN (LIORESAL) 10 MG TABLET    TAKE 1/2 TABLET (=5MG ) TWO TIMES A DAY   BIOTIN 5000 PO    Take by mouth.   BUPROPION (WELLBUTRIN XL) 300 MG 24 HR TABLET    Take 1 tablet (300 mg total) by mouth daily.   FEXOFENADINE (ALLEGRA) 180 MG TABLET    Take 1 tablet (180 mg total) by mouth daily.   FINASTERIDE (PROSCAR) 5 MG TABLET    Take 1/4 tablet by mouth daily   HYDROCHLOROTHIAZIDE (HYDRODIURIL) 25 MG TABLET    Take 1 tablet (25 mg total) by mouth daily.   LOSARTAN (COZAAR) 100 MG TABLET    Take 100 mg by mouth daily.   MOMETASONE (NASONEX) 50 MCG/ACT NASAL SPRAY    Place 2 sprays into  the nose daily.   MONTELUKAST (SINGULAIR) 10 MG TABLET    Take 1 tablet (10 mg total) by mouth at bedtime.   MULTIPLE VITAMIN (ONE-A-DAY ESSENTIAL PO)    Take by mouth.   PROBIOTIC PRODUCT (FORTIFY DAILY PROBIOTIC PO)    Take by mouth.   RANITIDINE (ZANTAC) 150 MG TABLET    Take 1 tablet (150 mg total) by mouth 2 (two) times daily.   SILDENAFIL (VIAGRA) 100 MG TABLET    Take 1 tablet (  100 mg total) by mouth as needed for erectile dysfunction (for use prior to sexual activity).   SUMATRIPTAN (IMITREX) 100 MG TABLET    TAKE 1 TABLET AS NEEDED FORMIGRAINE. MAY REPEAT IN 2  HOURS IF HEADACHE PERSISTS OR RECURS.   VERAPAMIL (CALAN-SR) 120 MG CR TABLET    Take 2 tablets (240 mg total) by mouth at bedtime. PATIENT MUST HAVE OFFICE VISIT PRIOR TO ANY FURTHER REFILLS   VITAMIN D, ERGOCALCIFEROL, (DRISDOL) 50000 UNITS CAPS CAPSULE    Take 1 capsule (50,000 Units total) by mouth every 7 (seven) days.  Modified Medications   No medications on file  Discontinued Medications   No medications on file    Patient has no known allergies.  Review of Systems: General:   Denies fever, chills, unexplained weight loss.  Optho/Auditory:   Denies visual changes, blurred vision/LOV Respiratory:   Denies SOB, DOE more than baseline levels.  Cardiovascular:   Denies chest pain, palpitations, new onset peripheral edema  Gastrointestinal:   Denies nausea, vomiting, diarrhea.  Genitourinary: Denies dysuria, freq/ urgency, flank pain or discharge from genitals.  Endocrine:     Denies hot or cold intolerance, polyuria, polydipsia. Musculoskeletal:   Denies unexplained myalgias, joint swelling, unexplained arthralgias, gait problems.  Skin:  Denies rash, suspicious lesions Neurological:     Denies dizziness, unexplained weakness, numbness  Psychiatric/Behavioral:   Denies mood changes, suicidal or homicidal ideations, hallucinations    Objective:     Blood pressure 120/76, pulse (!) 57, temperature 98 F (36.7  C), height 5\' 9"  (1.753 m), weight 190 lb (86.2 kg), SpO2 100 %. Body mass index is 28.06 kg/m. General Appearance:    Alert, cooperative, no distress, appears stated age  Head:    Normocephalic, without obvious abnormality, atraumatic  Eyes:    PERRL, conjunctiva/corneas clear, EOM's intact, fundi    benign, both eyes  Ears:    Normal TM's and external ear canals, both ears  Nose:   Nares normal, septum midline, mucosa normal, no drainage    or sinus tenderness  Throat:   Lips w/o lesion, mucosa moist, and tongue normal; teeth and   gums normal  Neck:   Supple, symmetrical, trachea midline, no adenopathy;    thyroid:  no enlargement/tenderness/nodules; no carotid   bruit or JVD  Back:     Symmetric, no curvature, ROM normal, no CVA tenderness  Lungs:     Clear to auscultation bilaterally, respirations unlabored, no       Wh/ R/ R  Chest Wall:    No tenderness or gross deformity; normal excursion   Heart:    Regular rate and rhythm, S1 and S2 normal, no murmur, rub   or gallop  Abdomen:     Soft, non-tender, bowel sounds active all four quadrants, NO   G/R/R, no masses, no organomegaly  Genitalia:   Ext genitalia: without lesion, no penile rash or discharge, no hernias appreciated   Rectal:   Normal tone, prostate WNL's and equal b/l, no tenderness; guaiac negative stool  Extremities:   Extremities normal, atraumatic, no cyanosis or gross edema  Pulses:   2+ and symmetric all extremities  Skin:   Warm, dry, Skin color, texture, turgor normal, no obvious rashes or lesions  M-Sk:   Ambulates * 4 w/o difficulty, no gross deformities, tone WNL  Neurologic:   CNII-XII intact, normal strength, sensation and reflexes    Throughout Psych:  No HI/SI, judgement and insight good, Euthymic mood. Full Affect.

## 2018-03-03 LAB — CBC WITH DIFFERENTIAL/PLATELET
Basophils Absolute: 0.1 10*3/uL (ref 0.0–0.2)
Basos: 1 %
EOS (ABSOLUTE): 0.2 10*3/uL (ref 0.0–0.4)
Eos: 3 %
Hematocrit: 37.3 % — ABNORMAL LOW (ref 37.5–51.0)
Hemoglobin: 13 g/dL (ref 13.0–17.7)
Immature Grans (Abs): 0 10*3/uL (ref 0.0–0.1)
Immature Granulocytes: 0 %
Lymphocytes Absolute: 1.3 10*3/uL (ref 0.7–3.1)
Lymphs: 24 %
MCH: 30.7 pg (ref 26.6–33.0)
MCHC: 34.9 g/dL (ref 31.5–35.7)
MCV: 88 fL (ref 79–97)
Monocytes Absolute: 0.4 10*3/uL (ref 0.1–0.9)
Monocytes: 8 %
Neutrophils Absolute: 3.4 10*3/uL (ref 1.4–7.0)
Neutrophils: 64 %
PLATELETS: 256 10*3/uL (ref 150–450)
RBC: 4.24 x10E6/uL (ref 4.14–5.80)
RDW: 12.5 % (ref 11.6–15.4)
WBC: 5.3 10*3/uL (ref 3.4–10.8)

## 2018-03-03 LAB — COMPREHENSIVE METABOLIC PANEL
ALT: 35 IU/L (ref 0–44)
AST: 18 IU/L (ref 0–40)
Albumin/Globulin Ratio: 2.1 (ref 1.2–2.2)
Albumin: 4.5 g/dL (ref 4.0–5.0)
Alkaline Phosphatase: 72 IU/L (ref 39–117)
BUN/Creatinine Ratio: 17 (ref 9–20)
BUN: 16 mg/dL (ref 6–24)
Bilirubin Total: 0.4 mg/dL (ref 0.0–1.2)
CO2: 25 mmol/L (ref 20–29)
Calcium: 9.7 mg/dL (ref 8.7–10.2)
Chloride: 101 mmol/L (ref 96–106)
Creatinine, Ser: 0.92 mg/dL (ref 0.76–1.27)
GFR calc Af Amer: 114 mL/min/{1.73_m2} (ref >59)
GFR calc non Af Amer: 99 mL/min/{1.73_m2} (ref >59)
GLUCOSE: 93 mg/dL (ref 65–99)
Globulin, Total: 2.1 g/dL (ref 1.5–4.5)
Potassium: 3.8 mmol/L (ref 3.5–5.2)
Sodium: 141 mmol/L (ref 134–144)
Total Protein: 6.6 g/dL (ref 6.0–8.5)

## 2018-03-03 LAB — T4, FREE: Free T4: 1.5 ng/dL (ref 0.82–1.77)

## 2018-03-03 LAB — LIPID PANEL
CHOL/HDL RATIO: 4.3 ratio (ref 0.0–5.0)
Cholesterol, Total: 196 mg/dL (ref 100–199)
HDL: 46 mg/dL (ref >39)
LDL Calculated: 133 mg/dL — ABNORMAL HIGH (ref 0–99)
Triglycerides: 85 mg/dL (ref 0–149)
VLDL Cholesterol Cal: 17 mg/dL (ref 5–40)

## 2018-03-03 LAB — HEMOGLOBIN A1C
Est. average glucose Bld gHb Est-mCnc: 88 mg/dL
Hgb A1c MFr Bld: 4.7 % — ABNORMAL LOW (ref 4.8–5.6)

## 2018-03-03 LAB — VITAMIN D 25 HYDROXY (VIT D DEFICIENCY, FRACTURES): Vit D, 25-Hydroxy: 46.1 ng/mL (ref 30.0–100.0)

## 2018-03-03 LAB — TSH: TSH: 2.03 u[IU]/mL (ref 0.450–4.500)

## 2018-03-03 LAB — T3: T3, Total: 111 ng/dL (ref 71–180)

## 2018-03-16 ENCOUNTER — Other Ambulatory Visit: Payer: Self-pay

## 2018-03-16 DIAGNOSIS — I10 Essential (primary) hypertension: Secondary | ICD-10-CM

## 2018-03-16 DIAGNOSIS — M6289 Other specified disorders of muscle: Secondary | ICD-10-CM

## 2018-03-16 DIAGNOSIS — R1031 Right lower quadrant pain: Secondary | ICD-10-CM

## 2018-03-16 MED ORDER — BACLOFEN 10 MG PO TABS: ORAL_TABLET | ORAL | 0 refills | Status: DC

## 2018-03-16 MED ORDER — SILDENAFIL CITRATE 100 MG PO TABS: 100.0000 mg | ORAL_TABLET | ORAL | 1 refills | Status: DC | PRN

## 2018-03-16 MED ORDER — VERAPAMIL HCL ER 120 MG PO TBCR: 240.0000 mg | EXTENDED_RELEASE_TABLET | Freq: Every day | ORAL | 0 refills | Status: DC

## 2018-03-22 ENCOUNTER — Other Ambulatory Visit: Payer: Self-pay

## 2018-03-22 DIAGNOSIS — I10 Essential (primary) hypertension: Secondary | ICD-10-CM

## 2018-03-22 DIAGNOSIS — G43809 Other migraine, not intractable, without status migrainosus: Secondary | ICD-10-CM

## 2018-03-22 DIAGNOSIS — J302 Other seasonal allergic rhinitis: Secondary | ICD-10-CM

## 2018-03-22 MED ORDER — VERAPAMIL HCL ER 120 MG PO TBCR: 240.0000 mg | EXTENDED_RELEASE_TABLET | Freq: Every day | ORAL | 0 refills | Status: DC

## 2018-03-22 MED ORDER — FINASTERIDE 5 MG PO TABS: ORAL_TABLET | ORAL | 2 refills | Status: DC

## 2018-03-22 MED ORDER — SUMATRIPTAN SUCCINATE 100 MG PO TABS: ORAL_TABLET | ORAL | 1 refills | Status: DC

## 2018-03-22 MED ORDER — MONTELUKAST SODIUM 10 MG PO TABS: 10.0000 mg | ORAL_TABLET | Freq: Every day | ORAL | 1 refills | Status: DC

## 2018-05-29 ENCOUNTER — Telehealth: Payer: Self-pay | Admitting: Family Medicine

## 2018-05-29 ENCOUNTER — Other Ambulatory Visit: Payer: Self-pay | Admitting: Family Medicine

## 2018-05-29 ENCOUNTER — Other Ambulatory Visit: Payer: Self-pay

## 2018-05-29 DIAGNOSIS — R1031 Right lower quadrant pain: Secondary | ICD-10-CM

## 2018-05-29 DIAGNOSIS — M6289 Other specified disorders of muscle: Secondary | ICD-10-CM

## 2018-05-29 DIAGNOSIS — J302 Other seasonal allergic rhinitis: Secondary | ICD-10-CM

## 2018-05-29 DIAGNOSIS — G43809 Other migraine, not intractable, without status migrainosus: Secondary | ICD-10-CM

## 2018-05-29 DIAGNOSIS — I10 Essential (primary) hypertension: Secondary | ICD-10-CM

## 2018-05-29 MED ORDER — AMLODIPINE-VALSARTAN-HCTZ 5-160-12.5 MG PO TABS: 1.0000 | ORAL_TABLET | Freq: Every day | ORAL | 1 refills | Status: DC

## 2018-05-29 NOTE — Telephone Encounter (Signed)
Sent refills into [facility] and canceled any remaining refills at [facility] mail order. MPulliam, CMA/RT(R)

## 2018-05-29 NOTE — Telephone Encounter (Signed)
Patient called his mail order pharm about getting his amlodipine refill sent out and is being advised he has no refills. I told patient she sent in a 6 mnth supply for him in Dec, so he should have plenty on file but he states that his pharm is nothing but trouble every time he needs a refill. He knows we sent in plenty for him and is aggravated they are saying different. Can we verify that the orginial order was sent correctly please?  Patient states he still has a weeks worth left.

## 2018-06-27 ENCOUNTER — Other Ambulatory Visit: Payer: Self-pay | Admitting: Family Medicine

## 2018-06-27 DIAGNOSIS — R1031 Right lower quadrant pain: Secondary | ICD-10-CM

## 2018-06-27 DIAGNOSIS — M6289 Other specified disorders of muscle: Secondary | ICD-10-CM

## 2018-06-29 ENCOUNTER — Encounter: Payer: Self-pay | Admitting: Family Medicine

## 2018-06-29 ENCOUNTER — Ambulatory Visit (INDEPENDENT_AMBULATORY_CARE_PROVIDER_SITE_OTHER): Payer: BLUE CROSS/BLUE SHIELD | Admitting: Family Medicine

## 2018-06-29 ENCOUNTER — Other Ambulatory Visit: Payer: Self-pay

## 2018-06-29 VITALS — BP 131/82 | HR 62 | Temp 98.2°F | Ht 69.0 in | Wt 189.9 lb

## 2018-06-29 DIAGNOSIS — J302 Other seasonal allergic rhinitis: Secondary | ICD-10-CM

## 2018-06-29 DIAGNOSIS — I10 Essential (primary) hypertension: Secondary | ICD-10-CM | POA: Diagnosis not present

## 2018-06-29 DIAGNOSIS — E7800 Pure hypercholesterolemia, unspecified: Secondary | ICD-10-CM

## 2018-06-29 DIAGNOSIS — G43809 Other migraine, not intractable, without status migrainosus: Secondary | ICD-10-CM | POA: Diagnosis not present

## 2018-06-29 DIAGNOSIS — E559 Vitamin D deficiency, unspecified: Secondary | ICD-10-CM

## 2018-06-29 DIAGNOSIS — F1510 Other stimulant abuse, uncomplicated: Secondary | ICD-10-CM

## 2018-06-29 NOTE — Progress Notes (Signed)
Telehealth office visit note for Paul Schroeder, D.O- at [facility] at [facility]   I connected with current patient today and verified that I am speaking with the correct person using two identifiers.   . Location of the patient: Home . Location of the provider: home Only the patient (+/- their family members at pt's discretion) and myself were participating in the encounter    - This visit type was conducted due to national recommendations for restrictions regarding the COVID-19 Pandemic (e.g. social distancing) in an effort to limit this patient's exposure and mitigate transmission in our community.  This format is felt to be most appropriate for this patient at this time.   - The patient did not have access to video technology or had technical difficulties with video requiring transitioning to audio format only. - No physical exam could be performed with this format, beyond that communicated to [place] by the patient/ family members as noted.   - Additionally my office staff/ schedulers discussed with the patient that there may be a monetary charge related to this service, depending on their medical insurance.   The patient expressed understanding, and agreed to proceed.       History of Present Illness:  Patient was last seen for chronic follow-up appointment back in January 13 2018.  He was seen in January for yearly physical however.  He is here today for chronic follow-up.  working full time- emotionally doing well and physically doing well.   Not exercising as much as he would like.  BP:   Running 131/82, 62;  Ave- 129/81 couple weeks now.   Did the dash diet- which made his bp go lower.  Taking all meds.   Being meticulous about the salt-->  Ms Deliah [place].  BELOW 2,000 mg prefers around 1500 mg  Mood:  Doing well overall- esp for being in house 2 mo straight.  Handling better at home from a work standpoint.   Allergies:  Been well controlled b/c not going outside at all   Caffeine abuse:   Cut out all caffeine sodas.  Always diet.   Tea',s now  Headaches - almost gone.   Was 1-2 /wk and now 1/mo  Vit D:  Taking it wkly and feels so much better due to all the posititive things he has been doing      Impression and Recommendations:    1. Essential hypertension   2. 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 Other migraine without status migrainosus, not intractable   4. Seasonal allergic rhinitis, unspecified trigger   5. Vitamin D insufficiency   6. Elevated LDL cholesterol level     -Blood pressure under excellent control at home.  Congratulated patient for doing DASH diet and seeing such vast improvements in it.  Continue with healthier habits in addition to meds -Caffeine overuse is now non-existent.  Patient is avoiding all caffeine and no longer with headaches/migraines.  Has so much better energy levels. -Seasonal allergies-symptoms stable. Vitamin D insufficiency: Patient would like his vitamin D checked since it is towards the end of winter before we head into the summer.  We will recheck that when he comes in for blood work in a month or so -Elevated LDL: He did see a tremendous spike back in January 2020 when we checked it.  Patient has been doing so good with dietary and lifestyle modifications and much healthier habits that he  would like to know what it is now.  Told him we will check it 4 to 6 months 1 when last checked.  Patient will make separate appointment for this in 2 to 6 weeks.  Otherwise I will see him in 4 months.  - As part of my medical decision making, I reviewed the following data within the [place] History obtained from pt /family, CMA notes reviewed and incorporated if applicable, Labs reviewed, Radiograph/ tests reviewed if applicable and OV notes from prior OV's with me, as well as other specialists she/he has seen since seeing me last, were all reviewed and used in my medical decision making  process today.   - Additionally, discussion had with patient regarding txmnt plan, and their biases/concerns about that plan were used in my medical decision making today.   - The patient agreed with the plan and demonstrated an understanding of the instructions.   No barriers to understanding were identified.   - Red flag symptoms and signs discussed in detail.  Patient expressed understanding regarding what to do in case of emergency\ urgent symptoms.  The patient was advised to call back or seek an in-person evaluation if the symptoms worsen or if the condition fails to improve as anticipated.   Return for FLP early June-July and then OV with me in 18mo.    Orders Placed This Encounter  Procedures  . Lipid Panel w/reflex Direct LDL  . VITAMIN D 25 Hydroxy (Vit-D Deficiency, Fractures)    I provided 23+ minutes of non-face-to-face time during this encounter,with over 50% of the time in direct counseling on patients medical conditions/ medical concerns.  Additional time was spent with charting and coordination of care after the actual visit commenced.   Note:  This note was prepared with assistance of Dragon voice recognition software. Occasional wrong-word or sound-a-like substitutions may have occurred due to the inherent limitations of voice recognition software.  Paul Dance, DO     Patient Care Team    Relationship Specialty Notifications Start End  Paul Dance, DO PCP - General Family Medicine  08/12/16   Laneta Simmers Physician Assistant Urology  03/10/15    Comment: REF PA  TO DR Jamal Collin 03-10-15  UNDER DR Cyndra Numbers, MD  General Surgery  03/10/15   Allyn Kenner, MD Consulting Physician Dermatology  08/12/16    Comment: Every 6 months goes for skin screenings due to his history of melanoma.  McKenzie, Candee Furbish, MD Consulting Physician Urology  08/12/16      -Vitals obtained; medications/ allergies reconciled;  personal medical, social,  Sx etc.histories were updated by CMA, reviewed by me and are reflected in chart   Patient Active Problem List   Diagnosis Date Noted  . HTN (hypertension) 08/12/2016    Priority: High  . Tobacco use disorder- approximately 28-pack-year history- NOW quit even vaping! 08/12/2016    Priority: High  . History of panic attacks 08/12/2016    Priority: High  . Adjustment disorder with mixed anxiety and depressed mood- since teenager 08/12/2016    Priority: High  . Gastroesophageal reflux disease 09/10/2016    Priority: Medium  . 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 )  08/12/2016    Priority: Medium  . Pelvic floor dysfunction- treated by urology 04/22/2015    Priority: Medium  . Environmental and seasonal allergies 08/12/2016    Priority: Low  . Seasonal allergic rhinitis 08/12/2016    Priority: Low  .  H/O non anemic vitamin B12 deficiency 08/12/2016    Priority: Low  . H/O iron deficiency anemia 08/12/2016    Priority: Low  . Encounter for counseling for tobacco use disorder 08/12/2016    Priority: Low  . Family history of mood disorder- MOM 08/12/2016    Priority: Low  .  (BMI 25.0-29.9) 03/02/2018  . 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 Insomnia 01/13/2018  . Medication monitoring encounter 07/13/2017  . Family history of hyperlipidemia 07/13/2017  . Health education/counseling 07/13/2017  . Erectile dysfunction 02/25/2017  . Vitamin D insufficiency 02/25/2017  . Anxious mood 09/10/2016  . HTN, goal below 130/80 09/10/2016  . 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 Melanoma of skin ([place]) 08/12/2016  . Dysuria 10/07/2015  . Right groin pain 03/05/2015  . Hematospermia 02/22/2015  . Epididymitis, right 02/22/2015     Current Meds  Medication Sig  . amLODIPine-Valsartan-HCTZ 5-160-12.5 MG TABS Take 1 tablet by mouth daily.  . baclofen (LIORESAL) 10 MG tablet TAKE 1/2 TABLET BY MOUTH  TWO TIMES A DAY  . BIOTIN 5000 PO Take by mouth.  Marland Kitchen buPROPion (WELLBUTRIN XL)  300 MG 24 hr tablet Take 1 tablet (300 mg total) by mouth daily.  . fexofenadine (ALLEGRA) 180 MG tablet Take 1 tablet (180 mg total) by mouth daily.  . finasteride (PROSCAR) 5 MG tablet Take 1/4 tablet by mouth daily  . losartan (COZAAR) 100 MG tablet Take 100 mg by mouth daily.  . mometasone (NASONEX) 50 MCG/ACT nasal spray Place 2 sprays into the nose daily.  . montelukast (SINGULAIR) 10 MG tablet TAKE 1 TABLET BY MOUTH AT  BEDTIME  . Multiple Vitamin (ONE-A-DAY ESSENTIAL PO) Take by mouth.  . Probiotic Product (FORTIFY DAILY PROBIOTIC PO) Take by mouth.  . ranitidine (ZANTAC) 150 MG tablet Take 1 tablet (150 mg total) by mouth 2 (two) times daily. (Patient taking differently: Take 150 mg by mouth daily. )  . SUMAtriptan (IMITREX) 100 MG tablet TAKE 1 TABLET BY MOUTH AS  NEEDED FOR MIGRAINE. MAY  REPEAT IN 2 HOURS IF  HEADACHE PERSISTS OR  RECURS.  . verapamil (CALAN-SR) 120 MG CR tablet TAKE 2 TABLETS BY MOUTH AT  BEDTIME  . Vitamin D, Ergocalciferol, (DRISDOL) 50000 units CAPS capsule Take 1 capsule (50,000 Units total) by mouth every 7 (seven) days.     Allergies:  No Known Allergies   ROS:  See above HPI for pertinent positives and negatives   Objective:   Blood pressure 131/82, pulse 62, temperature 98.2 F (36.8 C), temperature source Oral, height 5\' 9"  (1.753 m), weight 189 lb 14.4 oz (86.1 kg).  (if some vitals are omitted, this means that patient was UNABLE to obtain them even though they were asked to get them prior to OV today.  They were asked to call us at their earliest convenience with these once obtained. )  General: A & O * 3; sounds in no acute distress; in usual state of health.  Skin: Pt confirms warm and dry extremities and pink fingertips HEENT: Pt confirms lips non-cyanotic Chest: Patient confirms normal chest excursion and movement Respiratory: speaking in full sentences, no conversational dyspnea; patient confirms no use of accessory muscles Psych: insight  appears good, mood- appears full

## 2018-06-30 ENCOUNTER — Ambulatory Visit: Payer: BLUE CROSS/BLUE SHIELD | Admitting: Family Medicine

## 2018-08-14 ENCOUNTER — Other Ambulatory Visit: Payer: Self-pay | Admitting: Family Medicine

## 2018-08-14 DIAGNOSIS — I10 Essential (primary) hypertension: Secondary | ICD-10-CM

## 2018-08-15 MED ORDER — VERAPAMIL HCL ER 120 MG PO TBCR: 240.0000 mg | EXTENDED_RELEASE_TABLET | Freq: Every day | ORAL | 0 refills | Status: DC

## 2018-09-26 ENCOUNTER — Other Ambulatory Visit: Payer: Self-pay | Admitting: Family Medicine

## 2018-09-28 ENCOUNTER — Ambulatory Visit (INDEPENDENT_AMBULATORY_CARE_PROVIDER_SITE_OTHER): Payer: BC Managed Care – PPO | Admitting: Family Medicine

## 2018-09-28 ENCOUNTER — Other Ambulatory Visit: Payer: Self-pay

## 2018-09-28 ENCOUNTER — Encounter: Payer: Self-pay | Admitting: Family Medicine

## 2018-09-28 VITALS — BP 122/85 | HR 67 | Ht 69.0 in | Wt 190.0 lb

## 2018-09-28 DIAGNOSIS — F1510 Other stimulant abuse, uncomplicated: Secondary | ICD-10-CM

## 2018-09-28 DIAGNOSIS — L649 Androgenic alopecia, unspecified: Secondary | ICD-10-CM

## 2018-09-28 DIAGNOSIS — E7800 Pure hypercholesterolemia, unspecified: Secondary | ICD-10-CM | POA: Insufficient documentation

## 2018-09-28 DIAGNOSIS — I10 Essential (primary) hypertension: Secondary | ICD-10-CM | POA: Diagnosis not present

## 2018-09-28 DIAGNOSIS — M6289 Other specified disorders of muscle: Secondary | ICD-10-CM

## 2018-09-28 DIAGNOSIS — G43809 Other migraine, not intractable, without status migrainosus: Secondary | ICD-10-CM

## 2018-09-28 DIAGNOSIS — F419 Anxiety disorder, unspecified: Secondary | ICD-10-CM

## 2018-09-28 DIAGNOSIS — J302 Other seasonal allergic rhinitis: Secondary | ICD-10-CM

## 2018-09-28 DIAGNOSIS — Z789 Other specified health status: Secondary | ICD-10-CM

## 2018-09-28 DIAGNOSIS — Z716 Tobacco abuse counseling: Secondary | ICD-10-CM

## 2018-09-28 DIAGNOSIS — F17200 Nicotine dependence, unspecified, uncomplicated: Secondary | ICD-10-CM

## 2018-09-28 MED ORDER — VERAPAMIL HCL ER 120 MG PO TBCR: 240.0000 mg | EXTENDED_RELEASE_TABLET | Freq: Every day | ORAL | 1 refills | Status: DC

## 2018-09-28 MED ORDER — MONTELUKAST SODIUM 10 MG PO TABS: 10.0000 mg | ORAL_TABLET | Freq: Every day | ORAL | 1 refills | Status: DC

## 2018-09-28 MED ORDER — AMLODIPINE-VALSARTAN-HCTZ 5-160-12.5 MG PO TABS: 1.0000 | ORAL_TABLET | Freq: Every day | ORAL | 1 refills | Status: DC

## 2018-09-28 MED ORDER — SUMATRIPTAN SUCCINATE 100 MG PO TABS: ORAL_TABLET | ORAL | 1 refills | Status: DC

## 2018-09-28 MED ORDER — BUPROPION HCL ER (XL) 300 MG PO TB24: 300.0000 mg | ORAL_TABLET | Freq: Every day | ORAL | 1 refills | Status: DC

## 2018-09-28 MED ORDER — FINASTERIDE 5 MG PO TABS: ORAL_TABLET | ORAL | 2 refills | Status: DC

## 2018-09-28 NOTE — Progress Notes (Signed)
Telehealth office visit note for Paul Schroeder, D.O- at Primary Care at [facility]   I connected with current patient today and verified that I am speaking with the correct person using two identifiers.   . Location of the patient: Home . Location of the provider: Office Only the patient (+/- their family members at pt's discretion) and myself were participating in the encounter    - This visit type was conducted due to national recommendations for restrictions regarding the COVID-19 Pandemic (e.g. social distancing) in an effort to limit this patient's exposure and mitigate transmission in our community.  This format is felt to be most appropriate for this patient at this time.   - The patient did not have access to video technology or had technical difficulties with video requiring transitioning to audio format only. - No physical exam could be performed with this format, beyond that communicated to [place] by the patient/ family members as noted.   - Additionally my office staff/ schedulers discussed with the patient that there may be a monetary charge related to this service, depending on their medical insurance.   The patient expressed understanding, and agreed to proceed.       History of Present Illness:  States he's doing "pretty good."  Patient was last seen for chronic follow-up appointment 06/29/2018.  He is here today for chronic follow-up.  Says "everything is pretty status quo."  BP:  "Seems to be under control."  States cannot remember what his blood pressure was this morning (122/85), commenting that his average is "128-something now, which is good, considering what it was."  "I typically take it in the morning before I take my first dose of medicine, but sometimes I take it later in the day while doing different things."  At home, he uses a blood pressure cuff on his upper arm.  Feels his averages "comparatively look pretty good" compared to his blood pressures  obtained in doctors' offices.  Last appointment confirmed he was following dash diet, which made his bp go lower.   Taking all meds.   Being meticulous about the salt.  States he has been on the DASH diet for over a year now.  Ms Deliah Boston.  BELOW 2,000 mg prefers around 1500 mg.  Dietary Habits:  States he's on a diet of olive oil, cutting out all fat, two or three really low-fat proteins per week, mostly vegetarian, etc.  Says eats mostly chicken but "unfortunately I hate fish."  Sometimes eats shrimp, or lean beef.  Mood:  Doing well overall.  Feels his mood has been controlled.  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  Always diet.   Drinking water and tea mainly now.   Headaches - almost nonexistent when patient avoids caffeine.  Was 1-2 /wk in past and now 1/mo if patient avoids caffeine.  Says "I only had one other one that  didn't coincide with drinking Diet Pepsi and that was not as severe."  He took four Excedrin to treat this less severe migraine.  States there were two days he went out and bought Diet Pepsi recently, drank them, and "within a day I had a migraine that I had to take a pill for."  Says "It is not a coincidence."  Notes he knows that his headaches are caused by caffeine.  Vit D:  Taking it wkly and feels so much better due to all the posititive things he has been doing   Stomach Pain/Pelvic Floor Dysfunction:  Not seeing urology anymore, but still doing his  stretches. Says "other than that, I've been able to control it."  Not taking Baclofen and not taking Excedrin every day because the pain in his stomach has resolved.  "I do have some saved in case I have an attack or something, but I haven't had to at all."  He confirms that when he is more dehydrated or has more salt, he can feel more discomfort.  "It's not pain, it's like a throb."  He is not currently having sex.  Alcohol use:  States he cut out all alcohol about a year ago.   Impression and Recommendations:    1. Elevated LDL cholesterol level   2. 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 Other migraine without status migrainosus, not intractable   5. Tobacco use disorder- approximately 28-pack-year history- NOW quit even vaping!   6. Pelvic floor dysfunction- treated by urology   7. Anxious mood   8. Current non-drinker of alcohol   9. h/o Tobacco use disorder- approximately 28-pack-year history   10. Encounter for counseling for tobacco use disorder   11. Essential hypertension   12. 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  Congratulated patient for continuing his meticulous use of DASH diet and seeing such vast improvements in it.  Continue with healthier habits in addition to meds  Caffeine Abuse - Caffeine overuse is now non-existent.  Patient continues avoiding caffeine and no longer with weekly headaches/migraines, unless he "slips up" and uses caffeine again.  Continues with much better energy levels.  Tobacco Use Disorder - Has been avoiding all use of tobacco. - Encouraged patient to continue avoid tobacco use.  Seasonal Allergies -Seasonal allergies: Symptoms stable.  Vitamin D Insufficiency Vitamin D insufficiency:  Stable last check January 2020.  Need for re-check in near future.  Elevated LDL -  Elevated LDL:  He did see a tremendous spike back in January 2020 when we last checked it.  Patient has continued doing so good with intensive dietary and lifestyle modifications and much healthier habits.  Need for re-check.  Extensively discussed need for re-check on LDL.  Patient was supposed to come in July 2020, so encouraged him to come in near future for fasting lipid panel.  Pelvic Floor Dysfunction - Advised patient to continue with his exercises and stretches that he learned in PT. - Strongly encouraged patient to drink adequate amounts of water to remain hydrated.  - Advised patient to continue to avoid vaping and unhealthy habits.  - Continue to avoid alcohol.  Anxious Mood - Stable at this time on current management. - Continue treatment as established.  See med list.  - Reviewed the "spokes of the wheel" of  mood and health management.  Stressed the importance of ongoing prudent habits, including regular exercise, appropriate sleep hygiene, healthful dietary habits, and prayer/meditation to relax.  Prudent Lifestyle & Preventative Health Maintenance - Advised patient to continue working toward exercising to improve overall mental, physical, and emotional health.    - Encouraged patient to engage in daily physical activity, especially a formal exercise routine.  Recommended that the patient eventually strive for at least 150 minutes of moderate cardiovascular activity per week according to guidelines established by the [facility].   - Healthy dietary habits encouraged, including low-carb, and high amounts of lean protein in diet.   - Patient should also consume adequate amounts of water.  Recommendations - Return in 4 months for chronic follow-up.  - As part of my medical decision making, I reviewed the following data within the [place] History obtained from pt /family, CMA notes reviewed and incorporated if applicable, Labs reviewed, Radiograph/ tests reviewed if  applicable and OV notes from prior OV's with me, as well as other specialists she/he has seen since seeing me last, were all reviewed and used in my medical decision making process today.   - Additionally, discussion had with patient regarding txmnt plan, and their biases/concerns about that plan were used in my medical decision making today.   - The patient agreed with the plan and demonstrated an understanding of the instructions.   No barriers to understanding were identified.   - Red flag symptoms and signs discussed in detail.  Patient expressed understanding regarding what to do in case of emergency\ urgent symptoms.  The patient was advised to call back or seek an in-person evaluation if the symptoms worsen or if the condition fails to improve as anticipated.   Return for fbw- near future- see note; and 4 mo f/up otherwise.    Meds ordered this encounter  Medications  . buPROPion (WELLBUTRIN XL) 300 MG 24 hr tablet    Sig: Take 1 tablet (300 mg total) by mouth daily.    Dispense:  90 tablet    Refill:  1  . SUMAtriptan (IMITREX) 100 MG tablet    Sig: TAKE 1 TABLET BY MOUTH AS  NEEDED FOR MIGRAINE. MAY  REPEAT IN 2 HOURS IF  HEADACHE PERSISTS OR  RECURS.    Dispense:  9 tablet    Refill:  1  . verapamil (CALAN-SR) 120 MG CR tablet    Sig: Take 2 tablets (240 mg total) by mouth at bedtime.    Dispense:  180 tablet    Refill:  1  . montelukast (SINGULAIR) 10 MG tablet    Sig: Take 1 tablet (10 mg total) by mouth at bedtime.    Dispense:  90 tablet    Refill:  1  . amLODIPine-Valsartan-HCTZ 5-160-12.5 MG TABS    Sig: Take 1 tablet by mouth daily.    Dispense:  90 tablet    Refill:  1  . finasteride (PROSCAR) 5 MG tablet    Sig: Take 1/4 tablet by mouth daily    Dispense:  45 tablet    Refill:  2    Medications Discontinued During This Encounter  Medication Reason  . losartan (COZAAR) 100 MG tablet Completed Course  . fexofenadine (ALLEGRA) 180 MG tablet Completed Course  .  mometasone (NASONEX) 50 MCG/ACT nasal spray Completed Course  . ranitidine (ZANTAC) 150 MG tablet Change in therapy  . baclofen (LIORESAL) 10 MG tablet No longer needed (for PRN medications)  . buPROPion (  WELLBUTRIN XL) 300 MG 24 hr tablet Reorder  . finasteride (PROSCAR) 5 MG tablet Reorder  . montelukast (SINGULAIR) 10 MG tablet Reorder  . SUMAtriptan (IMITREX) 100 MG tablet Reorder  . amLODIPine-Valsartan-HCTZ 5-160-12.5 MG TABS Reorder  . verapamil (CALAN-SR) 120 MG CR tablet Reorder      I provided 22+ minutes of non-face-to-face time during this encounter,with over 50% of the time in direct counseling on patients medical conditions/ medical concerns.  Additional time was spent with charting and coordination of care after the actual visit commenced.   Note:  This note was prepared with assistance of Dragon voice recognition software. Occasional wrong-word or sound-a-like substitutions may have occurred due to the inherent limitations of voice recognition software.  Paul Dance, DO  This document serves as a record of services personally performed by Paul Dance, DO. It was created on her behalf by Toni Amend, a trained medical scribe. The creation of this record is based on the scribe's personal observations and the provider's statements to them.   I have reviewed the above medical documentation for accuracy and completeness and I concur.  Paul Dance, DO 09/28/2018 7:18 PM    Patient Care Team    Relationship Specialty Notifications Start End  Paul Dance, DO PCP - General Family Medicine  08/12/16   Laneta Simmers Physician Assistant Urology  03/10/15    Comment: REF PA  TO DR Jamal Collin 03-10-15  UNDER DR Cyndra Numbers, MD  General Surgery  03/10/15   Allyn Kenner, MD Consulting Physician Dermatology  08/12/16    Comment: Every 6 months goes for skin screenings due to his history of melanoma.  McKenzie, Candee Furbish, MD Consulting  Physician Urology  08/12/16      -Vitals obtained; medications/ allergies reconciled;  personal medical, social, Sx etc.histories were updated by CMA, reviewed by me and are reflected in chart   Patient Active Problem List   Diagnosis Date Noted  . HTN (hypertension) 08/12/2016    Priority: High  . Tobacco use disorder- approximately 28-pack-year history- NOW quit even vaping! 08/12/2016    Priority: High  . History of panic attacks 08/12/2016    Priority: High  . Adjustment disorder with mixed anxiety and depressed mood- since teenager 08/12/2016    Priority: High  . Gastroesophageal reflux disease 09/10/2016    Priority: Medium  . 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 )  08/12/2016    Priority: Medium  . Pelvic floor dysfunction- treated by urology 04/22/2015    Priority: Medium  . Environmental and seasonal allergies 08/12/2016    Priority: Low  . Seasonal allergic rhinitis 08/12/2016    Priority: Low  . H/O non anemic vitamin B12 deficiency 08/12/2016    Priority: Low  . H/O iron deficiency anemia 08/12/2016    Priority: Low  . Encounter for counseling for tobacco use disorder 08/12/2016    Priority: Low  . Family history of mood disorder- MOM 08/12/2016    Priority: Low  . Elevated LDL cholesterol level 09/28/2018  . Current non-drinker of alcohol 09/28/2018  .  (BMI 25.0-29.9) 03/02/2018  . 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 Insomnia 01/13/2018  . Medication monitoring encounter 07/13/2017  . Family history of hyperlipidemia 07/13/2017  . Health education/counseling 07/13/2017  . Erectile dysfunction 02/25/2017  . Vitamin D insufficiency 02/25/2017  . Anxious mood 09/10/2016  . HTN, goal below 130/80 09/10/2016  . 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 Melanoma of skin ([place])  08/12/2016  . Dysuria 10/07/2015  . Right groin pain 03/05/2015  . Hematospermia 02/22/2015  . Epididymitis, right 02/22/2015     Current Meds  Medication Sig  .  amLODIPine-Valsartan-HCTZ 5-160-12.5 MG TABS Take 1 tablet by mouth daily.  Marland Kitchen BIOTIN 5000 PO Take by mouth.  Marland Kitchen buPROPion (WELLBUTRIN XL) 300 MG 24 hr tablet Take 1 tablet (300 mg total) by mouth daily.  . finasteride (PROSCAR) 5 MG tablet Take 1/4 tablet by mouth daily  . fluticasone (FLONASE) 50 MCG/ACT nasal spray Place 1 spray into both nostrils daily as needed for allergies or rhinitis.  Marland Kitchen montelukast (SINGULAIR) 10 MG tablet Take 1 tablet (10 mg total) by mouth at bedtime.  . Multiple Vitamin (ONE-A-DAY ESSENTIAL PO) Take by mouth.  . Probiotic Product (FORTIFY DAILY PROBIOTIC PO) Take by mouth.  . SUMAtriptan (IMITREX) 100 MG tablet TAKE 1 TABLET BY MOUTH AS  NEEDED FOR MIGRAINE. MAY  REPEAT IN 2 HOURS IF  HEADACHE PERSISTS OR  RECURS.  . verapamil (CALAN-SR) 120 MG CR tablet Take 2 tablets (240 mg total) by mouth at bedtime.  . Vitamin D, Ergocalciferol, (DRISDOL) 50000 units CAPS capsule Take 1 capsule (50,000 Units total) by mouth every 7 (seven) days.  . [DISCONTINUED] amLODIPine-Valsartan-HCTZ 5-160-12.5 MG TABS Take 1 tablet by mouth daily.  . [DISCONTINUED] buPROPion (WELLBUTRIN XL) 300 MG 24 hr tablet Take 1 tablet (300 mg total) by mouth daily.  . [DISCONTINUED] finasteride (PROSCAR) 5 MG tablet Take 1/4 tablet by mouth daily  . [DISCONTINUED] montelukast (SINGULAIR) 10 MG tablet TAKE 1 TABLET BY MOUTH AT  BEDTIME  . [DISCONTINUED] SUMAtriptan (IMITREX) 100 MG tablet TAKE 1 TABLET BY MOUTH AS  NEEDED FOR MIGRAINE. MAY  REPEAT IN 2 HOURS IF  HEADACHE PERSISTS OR  RECURS.  . [DISCONTINUED] verapamil (CALAN-SR) 120 MG CR tablet Take 2 tablets (240 mg total) by mouth at bedtime.     Allergies:  No Known Allergies   ROS:  See above HPI for pertinent positives and negatives   Objective:   Blood pressure 122/85, pulse 67, height 5\' 9"  (1.753 m), weight 190 lb (86.2 kg).  (if some vitals are omitted, this means that patient was UNABLE to obtain them even though they were asked to  get them prior to OV today.  They were asked to call us at their earliest convenience with these once obtained. )  General: A & O * 3; sounds in no acute distress; in usual state of health.  Skin: Pt confirms warm and dry extremities and pink fingertips HEENT: Pt confirms lips non-cyanotic Chest: Patient confirms normal chest excursion and movement Respiratory: speaking in full sentences, no conversational dyspnea; patient confirms no use of accessory muscles Psych: insight appears good, mood- appears full

## 2018-11-27 ENCOUNTER — Other Ambulatory Visit: Payer: Self-pay

## 2018-11-27 DIAGNOSIS — Z20822 Contact with and (suspected) exposure to covid-19: Secondary | ICD-10-CM

## 2018-11-27 NOTE — Progress Notes (Addendum)
Virtual Visit via Telephone Note  I connected with Paul Schroeder on 11/28/2018 at 10:00 AM EDT by telephone and verified that I am speaking with the correct person using two identifiers.  Location: Patient: Home Provider: In Clinic   I discussed the limitations, risks, security and privacy concerns of performing an evaluation and management service by telephone and the availability of in person appointments. I also discussed with the patient that there may be a patient responsible charge related to this service. The patient expressed understanding and agreed to proceed.   History of Present Illness: Paul Schroeder calls in L ear hearing loss, L sided facial pain, clear nasal drainage, post nasal gtt, bil temporal HA (intermittent pressure, 3/10), sx's present >1 week. He reports nausea without vomiting 3 days ago- lasted about 1 hr, self resolved with rest. He denies change in sense of smell, but does report decrease in sense of taste. He denies cough/fever/dyspnea. He denies CP/chest tightness with exertion. He has hx of seasonal allergies and re-current sinus infections.  SARS-CoV-2-testing 11/27/2018- results pending   He denies exposure to [facility] virus  He quit tobacco use- Sept 2019!  He has continued montelukast, OTC Excedrin migraine and increasing fluids.  He has not been on ABX in last 90 days.  Patient Care Team    Relationship Specialty Notifications Start End  Mellody Dance, DO PCP - General Family Medicine  08/12/16   Laneta Simmers Physician Assistant Urology  03/10/15    Comment: REF PA  TO DR Jamal Collin 03-10-15  UNDER DR Cyndra Numbers, MD  General Surgery  03/10/15   Allyn Kenner, MD Consulting Physician Dermatology  08/12/16    Comment: Every 6 months goes for skin screenings due to his history of melanoma.  McKenzie, Candee Furbish, MD Consulting Physician Urology  08/12/16     Patient Active Problem List   Diagnosis Date Noted  .  Elevated LDL cholesterol level 09/28/2018  . Current non-drinker of alcohol 09/28/2018  .  (BMI 25.0-29.9) 03/02/2018  . 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 Insomnia 01/13/2018  . Medication monitoring encounter 07/13/2017  . Family history of hyperlipidemia 07/13/2017  . Health education/counseling 07/13/2017  . Erectile dysfunction 02/25/2017  . Vitamin D insufficiency 02/25/2017  . Anxious mood 09/10/2016  . Gastroesophageal reflux disease 09/10/2016  . HTN, goal below 130/80 09/10/2016  . 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 HTN (hypertension) 08/12/2016  . 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 )  08/12/2016  . Environmental and seasonal allergies 08/12/2016  . Seasonal allergic rhinitis 08/12/2016  . H/O non anemic vitamin B12 deficiency 08/12/2016  . H/O iron deficiency anemia 08/12/2016  . Tobacco use disorder- approximately 28-pack-year history- NOW quit even vaping! 08/12/2016  . Encounter for counseling for tobacco use disorder 08/12/2016  . History of panic attacks 08/12/2016  . Family history of mood disorder- MOM 08/12/2016  . Adjustment disorder with mixed anxiety and depressed mood- since teenager 08/12/2016  . Melanoma of skin ([place]) 08/12/2016  . Dysuria 10/07/2015  . Pelvic floor dysfunction- treated by urology 04/22/2015  . Right groin pain 03/05/2015  . Hematospermia 02/22/2015  . Epididymitis, right 02/22/2015     Past Medical History:  Diagnosis Date  . Allergy   . Anxiety   . Bronchitis   . Cancer [facility]) 2013   Malignant mole on his back removed   . Hematospermia   . Hemorrhoids   . Migraines    family Hx,  triggered by stress  . Seasonal allergies      Past Surgical History:  Procedure Laterality Date  . skin lesions removed       Family History  Problem Relation Age of Onset  . Hypertension Mother   . Hyperlipidemia Mother   . Stroke Mother   . Depression Mother   . Kidney disease Brother   . Kidney disease Maternal  Grandfather   . Heart disease Maternal Grandfather   . Prostate cancer Neg Hx      Social History   Substance and Sexual Activity  Drug Use Yes  . Frequency: 2.0 times per week   Comment: marijuana     Social History   Substance and Sexual Activity  Alcohol Use Yes  . Alcohol/week: 4.0 standard drinks  . Types: 4 Shots of liquor per week     Social History   Tobacco Use  Smoking Status Former Smoker  . Packs/day: 1.00  . Years: 3.00  . Pack years: 3.00  . Types: Cigarettes  . Quit date: 12/2017  . Years since quitting: 0.9  Smokeless Tobacco Never Used     Outpatient Encounter Medications as of 11/28/2018  Medication Sig  . amLODIPine-Valsartan-HCTZ 5-160-12.5 MG TABS Take 1 tablet by mouth daily.  Marland Kitchen BIOTIN 5000 PO Take by mouth.  Marland Kitchen buPROPion (WELLBUTRIN XL) 300 MG 24 hr tablet Take 1 tablet (300 mg total) by mouth daily.  . finasteride (PROSCAR) 5 MG tablet Take 1/4 tablet by mouth daily  . fluticasone (FLONASE) 50 MCG/ACT nasal spray Place 1 spray into both nostrils daily as needed for allergies or rhinitis.  Marland Kitchen montelukast (SINGULAIR) 10 MG tablet Take 1 tablet (10 mg total) by mouth at bedtime.  . Multiple Vitamin (ONE-A-DAY ESSENTIAL PO) Take by mouth.  . Probiotic Product (FORTIFY DAILY PROBIOTIC PO) Take by mouth.  . SUMAtriptan (IMITREX) 100 MG tablet TAKE 1 TABLET BY MOUTH AS  NEEDED FOR MIGRAINE. MAY  REPEAT IN 2 HOURS IF  HEADACHE PERSISTS OR  RECURS.  . verapamil (CALAN-SR) 120 MG CR tablet Take 2 tablets (240 mg total) by mouth at bedtime.  . Vitamin D, Ergocalciferol, (DRISDOL) 50000 units CAPS capsule Take 1 capsule (50,000 Units total) by mouth every 7 (seven) days.  . [DISCONTINUED] fluticasone (FLONASE) 50 MCG/ACT nasal spray Place 1 spray into both nostrils daily as needed for allergies or rhinitis.  Marland Kitchen amoxicillin-clavulanate (AUGMENTIN) 875-125 MG tablet Take 1 tablet by mouth 2 (two) times daily.   No facility-administered encounter  medications on file as of 11/28/2018.     Allergies: Patient has no known allergies.  Body mass index is 28.21 kg/m.  Blood pressure 126/85, pulse 75, temperature (!) 97.3 F (36.3 C), temperature source Skin, height 5\' 9"  (1.753 m), weight 191 lb (86.6 kg).  Review of Systems: General:   Denies fever, chills, unexplained weight loss.  Optho/Auditory:   Denies visual changes, blurred vision/LOV ENT: L sided hering loss + Nasal Drainage + Sore throat - Loss of sense of taste + Respiratory:   Denies SOB, DOE more than baseline levels.  Cardiovascular:   Denies chest pain, palpitations, new onset peripheral edema  Gastrointestinal:   Denies nausea, vomiting, diarrhea.  Genitourinary: Denies dysuria, freq/ urgency, flank pain or discharge from genitals.  Endocrine:     Denies hot or cold intolerance, polyuria, polydipsia. Musculoskeletal:   Denies unexplained myalgias, joint swelling, unexplained arthralgias, gait problems.  Skin:  Denies rash, suspicious lesions Neurological:     Denies dizziness, unexplained  weakness, numbness  Psychiatric/Behavioral:   Denies mood changes, suicidal or homicidal ideations, hallucinations    Observations/Objective: No acute distress noted during the telephone conversation.  Assessment and Plan: Remain under quarantine until SARS-CoV-2 results are final Increase fluids, rest Augmentin 875mg  BID x 10 days Flonase Remain tobacco/vape free If sx's persist after ABX completed, then call clinic  Follow Up Instructions: PRN   I discussed the assessment and treatment plan with the patient. The patient was provided an opportunity to ask questions and all were answered. The patient agreed with the plan and demonstrated an understanding of the instructions.   The patient was advised to call back or seek an in-person evaluation if the symptoms worsen or if the condition fails to improve as anticipated.  I provided 15 minutes of non-face-to-face time  during this encounter.   Esaw Grandchild, NP

## 2018-11-28 ENCOUNTER — Encounter: Payer: Self-pay | Admitting: Adult Health

## 2018-11-28 ENCOUNTER — Ambulatory Visit (INDEPENDENT_AMBULATORY_CARE_PROVIDER_SITE_OTHER): Payer: BC Managed Care – PPO | Admitting: Adult Health

## 2018-11-28 ENCOUNTER — Other Ambulatory Visit: Payer: Self-pay

## 2018-11-28 DIAGNOSIS — J329 Chronic sinusitis, unspecified: Secondary | ICD-10-CM | POA: Diagnosis not present

## 2018-11-28 DIAGNOSIS — J310 Chronic rhinitis: Secondary | ICD-10-CM | POA: Diagnosis not present

## 2018-11-28 LAB — NOVEL CORONAVIRUS, NAA: SARS-CoV-2, NAA: NOT DETECTED

## 2018-11-28 MED ORDER — FLUTICASONE PROPIONATE 50 MCG/ACT NA SUSP: 1.0000 | Freq: Every day | NASAL | 0 refills | Status: DC | PRN

## 2018-11-28 MED ORDER — AMOXICILLIN-POT CLAVULANATE 875-125 MG PO TABS: 1.0000 | ORAL_TABLET | Freq: Two times a day (BID) | ORAL | 0 refills | Status: DC

## 2018-11-28 NOTE — Assessment & Plan Note (Signed)
Assessment and Plan: Remain under quarantine until SARS-CoV-2 results are final Increase fluids, rest Augmentin 875mg  BID x 10 days Flonase Remain tobacco/vape free If sx's persist after ABX completed, then call clinic  Follow Up Instructions: PRN   I discussed the assessment and treatment plan with the patient. The patient was provided an opportunity to ask questions and all were answered. The patient agreed with the plan and demonstrated an understanding of the instructions.   The patient was advised to call back or seek an in-person evaluation if the symptoms worsen or if the condition fails to improve as anticipated.

## 2018-12-11 ENCOUNTER — Ambulatory Visit (INDEPENDENT_AMBULATORY_CARE_PROVIDER_SITE_OTHER): Payer: BC Managed Care – PPO | Admitting: Otolaryngology

## 2018-12-11 ENCOUNTER — Other Ambulatory Visit: Payer: Self-pay

## 2018-12-11 ENCOUNTER — Encounter (INDEPENDENT_AMBULATORY_CARE_PROVIDER_SITE_OTHER): Payer: Self-pay | Admitting: Otolaryngology

## 2018-12-11 VITALS — Temp 97.8°F | Ht 71.0 in | Wt 192.7 lb

## 2018-12-11 DIAGNOSIS — H6982 Other specified disorders of Eustachian tube, left ear: Secondary | ICD-10-CM | POA: Diagnosis not present

## 2018-12-11 DIAGNOSIS — H9120 Sudden idiopathic hearing loss, unspecified ear: Secondary | ICD-10-CM

## 2018-12-11 MED ORDER — PREDNISONE 10 MG PO TABS: 10.0000 mg | ORAL_TABLET | ORAL | 0 refills | Status: DC

## 2018-12-11 MED ORDER — TRIAMCINOLONE ACETONIDE 55 MCG/ACT NA AERO: 2.0000 | INHALATION_SPRAY | Freq: Every day | NASAL | 0 refills | Status: AC

## 2018-12-11 NOTE — Progress Notes (Signed)
HPI: Paul Schroeder is a 48 y.o. male who is referred by Dr. Raliegh Scarlet for evaluation of left ear discomfort. Patient states that around a month ago, he woke up with an electronic noise and pain in his left ear. He also had some throat pain and drainage at that time so he called his PCP who prescribed Augmentin x10 days. He finished antibiotic two days ago and drainage is gone, pain is better, but hearing has not improved. He has tried OTC ear drops but this has not helped. His ear is still uncomfortable down inside when he touches it. He does have history of allergies for which he takes Singulair and Flonase PRN. He states he has some congestion currently. He has not used Flonase recently. Patient denies ear drainage, nasal drainage currently.  Past Medical History:  Diagnosis Date  . Allergy   . Anxiety   . Bronchitis   . 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 Hematospermia   . Hemorrhoids   . Migraines    family Hx, triggered by stress  . Seasonal allergies    Past Surgical History:  Procedure Laterality Date  . skin lesions removed     Social History   Socioeconomic History  . Marital status: Married    Spouse name: Not on file  . Number of children: Not on file  . Years of education: Not on file  . Highest education level: Not on file  Occupational History  . Not on file  Social Needs  . Financial resource strain: Not on file  . Food insecurity    Worry: Not on file    Inability: Not on file  . Transportation needs    Medical: Not on file    Non-medical: Not on file  Tobacco Use  . Smoking status: Former Smoker    Packs/day: 1.00    Years: 3.00    Pack years: 3.00    Types: Cigarettes    Quit date: 12/2017    Years since quitting: 1.0  . Smokeless tobacco: Never Used  Substance and Sexual Activity  . Alcohol use: Yes    Alcohol/week: 4.0 standard drinks    Types: 4 Shots of liquor per week  . Drug use: Yes    Frequency: 2.0 times per  week    Comment: marijuana  . Sexual activity: Not on file  Lifestyle  . Physical activity    Days per week: Not on file    Minutes per session: Not on file  . Stress: Not on file  Relationships  . Social Herbalist on phone: Not on file    Gets together: Not on file    Attends religious service: Not on file    Active member of club or organization: Not on file    Attends meetings of clubs or organizations: Not on file    Relationship status: Not on file  Other Topics Concern  . Not on file  Social History Narrative  . Not on file   Family History  Problem Relation Age of Onset  . Hypertension Mother   . Hyperlipidemia Mother   . Stroke Mother   . Depression Mother   . Kidney disease Brother   . Kidney disease Maternal Grandfather   . Heart disease Maternal Grandfather   . Prostate cancer Neg Hx    No Known Allergies Prior to Admission medications   Medication Sig Start Date End Date Taking? Authorizing Provider  amLODIPine-Valsartan-HCTZ 5-160-12.5 MG TABS Take 1 tablet by mouth daily. 09/28/18   Mellody Dance, DO  amoxicillin-clavulanate (AUGMENTIN) 875-125 MG tablet Take 1 tablet by mouth 2 (two) times daily. 11/28/18   Danford, Valetta Fuller D, NP  BIOTIN 5000 PO Take by mouth.    [provider]  buPROPion (WELLBUTRIN XL) 300 MG 24 hr tablet Take 1 tablet (300 mg total) by mouth daily. 09/28/18   Mellody Dance, DO  finasteride (PROSCAR) 5 MG tablet Take 1/4 tablet by mouth daily 09/28/18   Opalski, Deborah, DO  fluticasone (FLONASE) 50 MCG/ACT nasal spray Place 1 spray into both nostrils daily as needed for allergies or rhinitis. 11/28/18   Danford, Valetta Fuller D, NP  montelukast (SINGULAIR) 10 MG tablet Take 1 tablet (10 mg total) by mouth at bedtime. 09/28/18   Mellody Dance, DO  Multiple Vitamin (ONE-A-DAY ESSENTIAL PO) Take by mouth.    [provider]  Probiotic Product (FORTIFY DAILY PROBIOTIC PO) Take by mouth.    [provider]   SUMAtriptan (IMITREX) 100 MG tablet TAKE 1 TABLET BY MOUTH AS  NEEDED FOR MIGRAINE. MAY  REPEAT IN 2 HOURS IF  HEADACHE PERSISTS OR  RECURS. 09/28/18   Opalski, Neoma Laming, DO  verapamil (CALAN-SR) 120 MG CR tablet Take 2 tablets (240 mg total) by mouth at bedtime. 09/28/18   Mellody Dance, DO  Vitamin D, Ergocalciferol, (DRISDOL) 50000 units CAPS capsule Take 1 capsule (50,000 Units total) by mouth every 7 (seven) days. 08/26/17   Opalski, Neoma Laming, DO     Positive ROS: positive for hearing loss and left ear pain; otherwise negative  All other systems have been reviewed and were otherwise negative with the exception of those mentioned in the HPI and as above.  Physical Exam: General: Alert, no acute distress Ears: External ears without lesions. EACs clear bilaterally. Right TM clear and intact. Left TM slightly retracted. Tuning forks reveal asymmetric hearing with R>L and combination of conductive and SNHL with 1024 tuning fork. Weber lateralizes to left. AC>BC bilaterally. Nasal: External nose without deformity or lesions. Clear nasal passages. Septum deviates to left. Middle meatus bilaterally without polyps or masses. Oral: Clear oropharynx Neck: No palpable adenopathy or masses   Assessment: Sudden SNHL, left Eustachian tube dysfunction, left  Plan: Recommend 12-day course of Prednisone, tapered, starting with 60mg  x3 days. Also recommended Nasacort nasal spray, 2 sprays each nostril x2 weeks. He will follow up in 2 weeks with hearing test.   Kiesha Ensey, PA-C   I have personally seen and examined this patient. I agree with the assessment and plan as outlined above. Radene Journey, MD   CC: Mellody Dance, DO

## 2018-12-14 ENCOUNTER — Other Ambulatory Visit: Payer: Self-pay | Admitting: Family Medicine

## 2018-12-14 DIAGNOSIS — E559 Vitamin D deficiency, unspecified: Secondary | ICD-10-CM

## 2018-12-14 DIAGNOSIS — G43809 Other migraine, not intractable, without status migrainosus: Secondary | ICD-10-CM

## 2018-12-22 ENCOUNTER — Other Ambulatory Visit: Payer: Self-pay

## 2018-12-22 ENCOUNTER — Encounter (INDEPENDENT_AMBULATORY_CARE_PROVIDER_SITE_OTHER): Payer: Self-pay

## 2018-12-22 ENCOUNTER — Ambulatory Visit (INDEPENDENT_AMBULATORY_CARE_PROVIDER_SITE_OTHER): Payer: BC Managed Care – PPO | Admitting: Otolaryngology

## 2018-12-22 ENCOUNTER — Encounter (INDEPENDENT_AMBULATORY_CARE_PROVIDER_SITE_OTHER): Payer: Self-pay | Admitting: Otolaryngology

## 2018-12-22 VITALS — Temp 98.2°F

## 2018-12-22 DIAGNOSIS — H9312 Tinnitus, left ear: Secondary | ICD-10-CM | POA: Diagnosis not present

## 2018-12-22 DIAGNOSIS — H6982 Other specified disorders of Eustachian tube, left ear: Secondary | ICD-10-CM | POA: Diagnosis not present

## 2018-12-22 DIAGNOSIS — H9012 Conductive hearing loss, unilateral, left ear, with unrestricted hearing on the contralateral side: Secondary | ICD-10-CM

## 2018-12-22 NOTE — Progress Notes (Addendum)
HPI: Paul Schroeder is a 48 y.o. male who returns today for evaluation of hearing loss in his left ear. Patient states hearing has gotten better since last visit two weeks ago. He has three more days of the prednisone prescription left but he has been using the Flonase nasal spray every night with benefit. He denies any ear pain since last being seen. He still notices that when he pops his ear, his left ear becomes completely clear like his right ear momentarily before returning to slightly decreased. No further complaints today. He brings with him an audiogram.  Past Medical History:  Diagnosis Date  . Allergy   . Anxiety   . Bronchitis   . Cancer [facility]) 2013   Malignant mole on his back removed   . Hematospermia   . Hemorrhoids   . Migraines    family Hx, triggered by stress  . Seasonal allergies    Past Surgical History:  Procedure Laterality Date  . skin lesions removed     Social History   Socioeconomic History  . Marital status: Married    Spouse name: Not on file  . Number of children: Not on file  . Years of education: Not on file  . Highest education level: Not on file  Occupational History  . Not on file  Social Needs  . Financial resource strain: Not on file  . Food insecurity    Worry: Not on file    Inability: Not on file  . Transportation needs    Medical: Not on file    Non-medical: Not on file  Tobacco Use  . Smoking status: Former Smoker    Packs/day: 1.00    Years: 24.00    Pack years: 24.00    Types: Cigarettes    Start date: 35    Quit date: 2017    Years since quitting: 3.8  . Smokeless tobacco: Never Used  Substance and Sexual Activity  . Alcohol use: Yes    Alcohol/week: 4.0 standard drinks    Types: 4 Shots of liquor per week  . Drug use: Yes    Frequency: 2.0 times per week    Comment: marijuana  . Sexual activity: Not on file  Lifestyle  . Physical activity    Days per week: Not on file    Minutes per session: Not on file  .  Stress: Not on file  Relationships  . Social Herbalist on phone: Not on file    Gets together: Not on file    Attends religious service: Not on file    Active member of club or organization: Not on file    Attends meetings of clubs or organizations: Not on file    Relationship status: Not on file  Other Topics Concern  . Not on file  Social History Narrative  . Not on file   Family History  Problem Relation Age of Onset  . Hypertension Mother   . Hyperlipidemia Mother   . Stroke Mother   . Depression Mother   . Kidney disease Brother   . Kidney disease Maternal Grandfather   . Heart disease Maternal Grandfather   . Prostate cancer Neg Hx    No Known Allergies Prior to Admission medications   Medication Sig Start Date End Date Taking? Authorizing Provider  amLODIPine-Valsartan-HCTZ 5-160-12.5 MG TABS Take 1 tablet by mouth daily. 09/28/18  Yes Opalski, Deborah, DO  amoxicillin-clavulanate (AUGMENTIN) 875-125 MG tablet Take 1 tablet by mouth 2 (two)  times daily. 11/28/18  Yes Danford, Katy D, NP  BIOTIN 5000 PO Take by mouth.   Yes [provider]  buPROPion (WELLBUTRIN XL) 300 MG 24 hr tablet Take 1 tablet (300 mg total) by mouth daily. 09/28/18  Yes Opalski, Neoma Laming, DO  finasteride (PROSCAR) 5 MG tablet Take 1/4 tablet by mouth daily 09/28/18  Yes Opalski, Deborah, DO  fluticasone (FLONASE) 50 MCG/ACT nasal spray Place 1 spray into both nostrils daily as needed for allergies or rhinitis. 11/28/18  Yes Danford, Valetta Fuller D, NP  montelukast (SINGULAIR) 10 MG tablet Take 1 tablet (10 mg total) by mouth at bedtime. 09/28/18  Yes Opalski, Deborah, DO  Multiple Vitamin (ONE-A-DAY ESSENTIAL PO) Take by mouth.   Yes [provider]  predniSONE (DELTASONE) 10 MG tablet Take 1 tablet (10 mg total) by mouth as directed. Take 6 tablets (60mg ) x3 days, 5 tablets (50mg ) x2 days, 4 tablets (40mg ) x 2 days, 3 tablets (30mg ) x2 days, 2 tablets (20mg ) x 2 days, 1 tablet (10mg )  x 1 day 12/11/18  Yes Rozetta Nunnery, MD  Probiotic Product (FORTIFY DAILY PROBIOTIC PO) Take by mouth.   Yes [provider]  SUMAtriptan (IMITREX) 100 MG tablet TAKE 1 TABLET BY MOUTH AS  NEEDED FOR MIGRAINE. MAY  REPEAT IN 2 HOURS IF  HEADACHE PERSISTS OR RECURS 12/14/18  Yes Opalski, Deborah, DO  triamcinolone (NASACORT) 55 MCG/ACT AERO nasal inhaler Place 2 sprays into the nose at bedtime. Into each nostril 12/11/18  Yes Rozetta Nunnery, MD  verapamil (CALAN-SR) 120 MG CR tablet Take 2 tablets (240 mg total) by mouth at bedtime. 09/28/18  Yes Opalski, Deborah, DO  Vitamin D, Ergocalciferol, (DRISDOL) 1.25 MG (50000 UT) CAPS capsule TAKE 1 CAPSULE BY MOUTH  EVERY 7 DAYS 12/14/18  Yes Opalski, Deborah, DO     Positive ROS: positive for hearing loss, otherwise negative  All other systems have been reviewed and were otherwise negative with the exception of those mentioned in the HPI and as above.  Physical Exam: General: Alert, no acute distress Ears: Ear canals are clear bilaterally with intact, clear TMs. Pneumatic otoscopy reveals normal movement of bilateral TM. Weber lateralizes to the left. AC>BC bilaterally with 512 Hz tuning fork while AC=BC in left ear with 1024 Hz. Audiogram shows normal hearing on the right side with a mild high frequency conductive hearing loss on the left. SRTs are 10 dB on the right and 25 dB on the left. Type A tympanogram on the right with Type Ad on the left. 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 Instructed patient to finish course of Prednisone and continue Flonase nasal spray. He will call if symptoms do not continue to improve over next couple months. Otherwise, he will follow up PRN.  Christin Hoffstadt, PA-C  I have  personally seen and examined this patient. I agree with the assessment and plan as outlined above. Radene Journey, MD

## 2019-01-14 ENCOUNTER — Other Ambulatory Visit: Payer: Self-pay | Admitting: Family Medicine

## 2019-01-14 DIAGNOSIS — Z716 Tobacco abuse counseling: Secondary | ICD-10-CM

## 2019-01-14 DIAGNOSIS — F419 Anxiety disorder, unspecified: Secondary | ICD-10-CM

## 2019-01-14 DIAGNOSIS — I10 Essential (primary) hypertension: Secondary | ICD-10-CM

## 2019-01-14 DIAGNOSIS — F17200 Nicotine dependence, unspecified, uncomplicated: Secondary | ICD-10-CM

## 2019-01-14 DIAGNOSIS — G43809 Other migraine, not intractable, without status migrainosus: Secondary | ICD-10-CM

## 2019-01-14 DIAGNOSIS — E559 Vitamin D deficiency, unspecified: Secondary | ICD-10-CM

## 2019-03-19 ENCOUNTER — Other Ambulatory Visit: Payer: Self-pay | Admitting: Family Medicine

## 2019-03-19 DIAGNOSIS — J302 Other seasonal allergic rhinitis: Secondary | ICD-10-CM

## 2019-03-23 ENCOUNTER — Other Ambulatory Visit: Payer: Self-pay | Admitting: Family Medicine

## 2019-03-23 DIAGNOSIS — G43809 Other migraine, not intractable, without status migrainosus: Secondary | ICD-10-CM

## 2019-03-23 DIAGNOSIS — I10 Essential (primary) hypertension: Secondary | ICD-10-CM

## 2019-03-23 DIAGNOSIS — Z716 Tobacco abuse counseling: Secondary | ICD-10-CM

## 2019-03-23 DIAGNOSIS — E559 Vitamin D deficiency, unspecified: Secondary | ICD-10-CM

## 2019-03-23 DIAGNOSIS — F419 Anxiety disorder, unspecified: Secondary | ICD-10-CM

## 2019-03-23 DIAGNOSIS — F17200 Nicotine dependence, unspecified, uncomplicated: Secondary | ICD-10-CM

## 2019-03-30 ENCOUNTER — Other Ambulatory Visit: Payer: Self-pay | Admitting: Adult Health

## 2019-04-02 MED ORDER — FLUTICASONE PROPIONATE 50 MCG/ACT NA SUSP: 1.0000 | Freq: Every day | NASAL | 0 refills | Status: DC | PRN

## 2019-04-04 ENCOUNTER — Other Ambulatory Visit: Payer: Self-pay | Admitting: Family Medicine

## 2019-04-04 DIAGNOSIS — F17200 Nicotine dependence, unspecified, uncomplicated: Secondary | ICD-10-CM

## 2019-04-04 DIAGNOSIS — F419 Anxiety disorder, unspecified: Secondary | ICD-10-CM

## 2019-04-04 DIAGNOSIS — E559 Vitamin D deficiency, unspecified: Secondary | ICD-10-CM

## 2019-04-04 DIAGNOSIS — Z716 Tobacco abuse counseling: Secondary | ICD-10-CM

## 2019-04-09 ENCOUNTER — Other Ambulatory Visit: Payer: Self-pay | Admitting: Family Medicine

## 2019-04-09 DIAGNOSIS — J302 Other seasonal allergic rhinitis: Secondary | ICD-10-CM

## 2019-04-09 MED ORDER — MONTELUKAST SODIUM 10 MG PO TABS: 10.0000 mg | ORAL_TABLET | Freq: Every day | ORAL | 0 refills | Status: DC

## 2019-04-10 ENCOUNTER — Encounter: Payer: Self-pay | Admitting: Family Medicine

## 2019-04-10 DIAGNOSIS — I10 Essential (primary) hypertension: Secondary | ICD-10-CM

## 2019-04-10 MED ORDER — AMLODIPINE-VALSARTAN-HCTZ 5-160-12.5 MG PO TABS: 1.0000 | ORAL_TABLET | Freq: Every day | ORAL | 0 refills | Status: DC

## 2019-08-23 ENCOUNTER — Telehealth: Payer: Self-pay | Admitting: Physician Assistant

## 2019-08-23 DIAGNOSIS — I10 Essential (primary) hypertension: Secondary | ICD-10-CM

## 2019-08-23 MED ORDER — VERAPAMIL HCL ER 120 MG PO TBCR: 120.0000 mg | EXTENDED_RELEASE_TABLET | Freq: Two times a day (BID) | ORAL | 0 refills | Status: DC

## 2019-08-23 NOTE — Addendum Note (Signed)
Addended by: Fonnie Mu on: 08/23/2019 11:37 AM   Modules accepted: Orders

## 2019-08-23 NOTE — Telephone Encounter (Signed)
Spoke with pt and advised him that, per our protocols, I can only send in a 60 day supply for this medication and that he must keep his f/u appt on 09/25/2019 before any further refills will be authorized.  Pt expressed understanding and is agreeable.  Charyl Bigger, CMA

## 2019-08-23 NOTE — Telephone Encounter (Signed)
Patient is aware he is overdue for a f/u for additional refills and has scheduled our first available appt (09/25/19), he will be out of his verapamil before then and is requesting a refill to get to appt. If approved please send to [facility].

## 2019-08-29 DIAGNOSIS — L820 Inflamed seborrheic keratosis: Secondary | ICD-10-CM | POA: Diagnosis not present

## 2019-08-29 DIAGNOSIS — Z1283 Encounter for screening for malignant neoplasm of skin: Secondary | ICD-10-CM | POA: Diagnosis not present

## 2019-08-29 DIAGNOSIS — D225 Melanocytic nevi of trunk: Secondary | ICD-10-CM | POA: Diagnosis not present

## 2019-09-25 ENCOUNTER — Ambulatory Visit (INDEPENDENT_AMBULATORY_CARE_PROVIDER_SITE_OTHER): Payer: BC Managed Care – PPO | Admitting: Physician Assistant

## 2019-09-25 ENCOUNTER — Other Ambulatory Visit: Payer: Self-pay

## 2019-09-25 ENCOUNTER — Encounter: Payer: Self-pay | Admitting: Physician Assistant

## 2019-09-25 VITALS — BP 136/82 | HR 62 | Temp 98.1°F | Ht 71.0 in | Wt 185.3 lb

## 2019-09-25 DIAGNOSIS — E7800 Pure hypercholesterolemia, unspecified: Secondary | ICD-10-CM | POA: Diagnosis not present

## 2019-09-25 DIAGNOSIS — E559 Vitamin D deficiency, unspecified: Secondary | ICD-10-CM

## 2019-09-25 DIAGNOSIS — I10 Essential (primary) hypertension: Secondary | ICD-10-CM

## 2019-09-25 DIAGNOSIS — F419 Anxiety disorder, unspecified: Secondary | ICD-10-CM

## 2019-09-25 DIAGNOSIS — Z0000 Encounter for general adult medical examination without abnormal findings: Secondary | ICD-10-CM

## 2019-09-25 DIAGNOSIS — G43809 Other migraine, not intractable, without status migrainosus: Secondary | ICD-10-CM

## 2019-09-25 MED ORDER — HYDROCHLOROTHIAZIDE 12.5 MG PO TABS: 12.5000 mg | ORAL_TABLET | Freq: Every day | ORAL | 1 refills | Status: DC

## 2019-09-25 MED ORDER — AMLODIPINE BESYLATE 5 MG PO TABS: 5.0000 mg | ORAL_TABLET | Freq: Every day | ORAL | 1 refills | Status: DC

## 2019-09-25 MED ORDER — VALSARTAN 160 MG PO TABS: 160.0000 mg | ORAL_TABLET | Freq: Every day | ORAL | 1 refills | Status: DC

## 2019-09-25 NOTE — Progress Notes (Signed)
Established Patient Office Visit  Subjective:  Patient ID: Paul Schroeder, male    DOB: [birth year]  Age: 49 y.o. MRN: [id number]  CC:  Chief Complaint  Patient presents with  . Hypertension  . Hyperlipidemia    HPI [name] presents for follow-up on hypertension and hyperlipidemia.  HTN: Pt denies chest pain, palpitations, dizziness or lower extremity swelling.  Reports he has not been taking the triple combination of amlodipine valsartan and HCTZ because it is on backorder.  He has been taking verapamil.  Checks BP at home and readings range in 135-140/86-90.  States his blood pressure was better when he was also taking other medication. Pt follows a low salt diet.  HLD: Pt states he has made dietary changes and eating more lean meats and vegetables.  States he works from home and his job is sedentary but tries to walk as much as he can.  Mood: States he has not taking Wellbutrin for 2 months and feels like his mood has been doing fine.  He prefers to be off medication due to side effects.  Denies increased anxiety.  Migraine: Reports an increase in headache frequency.  Last time he had to take Imitrex was last week.  Past Medical History:  Diagnosis Date  . Allergy   . Anxiety   . Bronchitis   . Cancer [facility]) 2013   Malignant mole on his back removed   . Hematospermia   . Hemorrhoids   . Migraines    family Hx, triggered by stress  . Seasonal allergies     Past Surgical History:  Procedure Laterality Date  . skin lesions removed      Family History  Problem Relation Age of Onset  . Hypertension Mother   . Hyperlipidemia Mother   . Stroke Mother   . Depression Mother   . Kidney disease Brother   . Kidney disease Maternal Grandfather   . Heart disease Maternal Grandfather   . Prostate cancer Neg Hx     Social History   Socioeconomic History  . Marital status: Married    Spouse name: Not on file  . Number of children: Not on file  . Years  of education: Not on file  . Highest education level: Not on file  Occupational History  . Not on file  Tobacco Use  . Smoking status: Former Smoker    Packs/day: 1.00    Years: 24.00    Pack years: 24.00    Types: Cigarettes    Start date: 29    Quit date: 2017    Years since quitting: 4.6  . Smokeless tobacco: Never Used  Vaping Use  . Vaping Use: Never used  Substance and Sexual Activity  . Alcohol use: Yes    Alcohol/week: 4.0 standard drinks    Types: 4 Shots of liquor per week  . Drug use: Yes    Frequency: 2.0 times per week    Comment: marijuana  . Sexual activity: Not on file  Other Topics Concern  . Not on file  Social History Narrative  . Not on file   Social Determinants of Health   Financial Resource Strain:   . Difficulty of Paying Living Expenses:   Food Insecurity:   . Worried About Charity fundraiser in the Last Year:   . Arboriculturist in the Last Year:   Transportation Needs:   . Film/video editor (Medical):   Marland Kitchen Lack of Transportation (Non-Medical):  Physical Activity:   . Days of Exercise per Week:   . Minutes of Exercise per Session:   Stress:   . Feeling of Stress :   Social Connections:   . Frequency of Communication with Friends and Family:   . Frequency of Social Gatherings with Friends and Family:   . Attends Religious Services:   . Active Member of Clubs or Organizations:   . Attends Archivist Meetings:   Marland Kitchen Marital Status:   Intimate Partner Violence:   . Fear of Current or Ex-Partner:   . Emotionally Abused:   Marland Kitchen Physically Abused:   . Sexually Abused:     Outpatient Medications Prior to Visit  Medication Sig Dispense Refill  . BIOTIN 5000 PO Take by mouth.    Marland Kitchen buPROPion (WELLBUTRIN XL) 300 MG 24 hr tablet Take 1 tablet (300 mg total) by mouth daily. **PATIENT NEEDS APT FOR FURTHER REFILLS** 15 tablet 0  . finasteride (PROSCAR) 5 MG tablet Take 1/4 tablet by mouth daily 45 tablet 2  . fluticasone (FLONASE)  50 MCG/ACT nasal spray Place 1 spray into both nostrils daily as needed for allergies or rhinitis. 16 g 0  . montelukast (SINGULAIR) 10 MG tablet Take 1 tablet (10 mg total) by mouth at bedtime. **PATIENT NEEDS APT FOR FURTHER REFILLS** 15 tablet 0  . Multiple Vitamin (ONE-A-DAY ESSENTIAL PO) Take by mouth.    . Probiotic Product (FORTIFY DAILY PROBIOTIC PO) Take by mouth.    . SUMAtriptan (IMITREX) 100 MG tablet TAKE 1 TABLET BY MOUTH AS  NEEDED FOR MIGRAINE. MAY  REPEAT IN 2 HOURS IF  HEADACHE PERSISTS OR  RECURS. **PATIENT NEEDS APT FOR FURTHER REFILLS** 4 tablet 0  . triamcinolone (NASACORT) 55 MCG/ACT AERO nasal inhaler Place 2 sprays into the nose at bedtime. Into each nostril 302.4 mL 0  . verapamil (CALAN-SR) 120 MG CR tablet Take 1 tablet (120 mg total) by mouth 2 (two) times daily. **PATIENT NEEDS APT FOR FURTHER REFILLS** 120 tablet 0  . amLODIPine-Valsartan-HCTZ 5-160-12.5 MG TABS Take 1 tablet by mouth daily. 30 tablet 0  . amoxicillin-clavulanate (AUGMENTIN) 875-125 MG tablet Take 1 tablet by mouth 2 (two) times daily. 20 tablet 0  . predniSONE (DELTASONE) 10 MG tablet Take 1 tablet (10 mg total) by mouth as directed. Take 6 tablets (60mg ) x3 days, 5 tablets (50mg ) x2 days, 4 tablets (40mg ) x 2 days, 3 tablets (30mg ) x2 days, 2 tablets (20mg ) x 2 days, 1 tablet (10mg ) x 1 day 47 tablet 0  . Vitamin D, Ergocalciferol, (DRISDOL) 1.25 MG (50000 UNIT) CAPS capsule Take 1 capsule (50,000 Units total) by mouth every 7 (seven) days. **PATIENT NEEDS APT FOR FURTHER REFILLS** 2 capsule 0   No facility-administered medications prior to visit.    No Known Allergies  ROS Review of Systems Review of Systems:  A fourteen system review of systems was performed and found to be positive as per HPI.  Objective:    Physical Exam General:  Well Developed, well nourished, appropriate for stated age.  Neuro:  Alert and oriented,  extra-ocular muscles intact  HEENT:  Normocephalic, atraumatic, neck  supple Skin:  no gross rash, warm, pink. Cardiac:  RRR, S1 S2 w/o murmur Respiratory:  ECTA B/L and A/P, Not using accessory muscles, speaking in full sentences- unlabored. Vascular:  Ext warm, no cyanosis apprec.; cap RF less 2 sec. No gross edema Psych:  No HI/SI, judgement and insight good, Euthymic mood. Full Affect.   BP 136/82  Pulse 62   Temp 98.1 F (36.7 C) (Oral)   Ht 5\' 11"  (1.803 m)   Wt 185 lb 4.8 oz (84.1 kg)   SpO2 98%   BMI 25.84 kg/m  Wt Readings from Last 3 Encounters:  09/25/19 185 lb 4.8 oz (84.1 kg)  12/11/18 192 lb 11.2 oz (87.4 kg)  11/28/18 191 lb (86.6 kg)     Health Maintenance Due  Topic Date Due  . Hepatitis C Screening  Never done  . COVID-19 Vaccine (1) Never done  . INFLUENZA VACCINE  09/09/2019    There are no preventive care reminders to display for this patient.  Lab Results  Component Value Date   TSH 2.030 03/02/2018   Lab Results  Component Value Date   WBC 5.3 03/02/2018   HGB 13.0 03/02/2018   HCT 37.3 (L) 03/02/2018   MCV 88 03/02/2018   PLT 256 03/02/2018   Lab Results  Component Value Date   NA 141 03/02/2018   K 3.8 03/02/2018   CO2 25 03/02/2018   GLUCOSE 93 03/02/2018   BUN 16 03/02/2018   CREATININE 0.92 03/02/2018   BILITOT 0.4 03/02/2018   ALKPHOS 72 03/02/2018   AST 18 03/02/2018   ALT 35 03/02/2018   PROT 6.6 03/02/2018   ALBUMIN 4.5 03/02/2018   CALCIUM 9.7 03/02/2018   Lab Results  Component Value Date   CHOL 196 03/02/2018   Lab Results  Component Value Date   HDL 46 03/02/2018   Lab Results  Component Value Date   LDLCALC 133 (H) 03/02/2018   Lab Results  Component Value Date   TRIG 85 03/02/2018   Lab Results  Component Value Date   CHOLHDL 4.3 03/02/2018   Lab Results  Component Value Date   HGBA1C 4.7 (L) 03/02/2018      Assessment & Plan:   Problem List Items Addressed This Visit      Cardiovascular and Mediastinum   HTN (hypertension) - Primary (Chronic)   Relevant  Medications   hydrochlorothiazide (HYDRODIURIL) 12.5 MG tablet   valsartan (DIOVAN) 160 MG tablet   amLODipine (NORVASC) 5 MG tablet   Other Relevant Orders   CBC   Comprehensive metabolic panel   Hemoglobin A1c   Lipid panel   TSH   VITAMIN D 25 Hydroxy (Vit-D Deficiency, Fractures)   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 )  (Chronic)   Relevant Medications   hydrochlorothiazide (HYDRODIURIL) 12.5 MG tablet   valsartan (DIOVAN) 160 MG tablet   amLODipine (NORVASC) 5 MG tablet     Other   Vitamin D insufficiency   Relevant Orders   CBC   Comprehensive metabolic panel   Hemoglobin A1c   Lipid panel   TSH   VITAMIN D 25 Hydroxy (Vit-D Deficiency, Fractures)   Elevated LDL cholesterol level   Relevant Orders   CBC   Comprehensive metabolic panel   Hemoglobin A1c   Lipid panel   TSH   VITAMIN D 25 Hydroxy (Vit-D Deficiency, Fractures)    Other Visit Diagnoses    Healthcare maintenance       Relevant Orders   CBC   Comprehensive metabolic panel   Hemoglobin A1c   Lipid panel   TSH   VITAMIN D 25 Hydroxy (Vit-D Deficiency, Fractures)     Hypertension: -BP today at goal -Clinical staff contacted [facility] on [address] and do not have triple combination med but can be sent individually.  Patient agreeable.  Sent prescriptions.  Continue verapamil. -Continue ambulatory BP and pulse monitoring. -Continue low-sodium diet. -Stay well-hydrated. -Checking CMP for medication monitoring.  Elevated LDL cholesterol level: -Last lipid panel: LDL 133 -Follow heart healthy diet and stay as active as possible. -Rechecking lipid panel today.  Anxious mood: -Stable, denies SI/HI. -Patient has not been on medication for 2 months and reasonable to trial being off medication. -Encouraged nonpharmacological therapy such as exercise, relaxation techniques and mindfulness therapy. -We will continue to monitor  Migraines with aura: -Stable -Continue current medication  regimen. -Will continue to monitor.  Healthcare maintenance: -Patient is fasting so will collect blood work for CPE. -History of vitamin D insufficiency so we will collect a vitamin D. -Request other medication refills when due.  Meds ordered this encounter  Medications  . hydrochlorothiazide (HYDRODIURIL) 12.5 MG tablet    Sig: Take 1 tablet (12.5 mg total) by mouth daily.    Dispense:  90 tablet    Refill:  1  . valsartan (DIOVAN) 160 MG tablet    Sig: Take 1 tablet (160 mg total) by mouth daily.    Dispense:  90 tablet    Refill:  1  . amLODipine (NORVASC) 5 MG tablet    Sig: Take 1 tablet (5 mg total) by mouth daily.    Dispense:  90 tablet    Refill:  1    Follow-up: Return in about 4 months (around 01/25/2020) for CPE .    Lorrene Reid, PA-C

## 2019-09-25 NOTE — Patient Instructions (Signed)
DASH Eating Plan DASH stands for "Dietary Approaches to Stop Hypertension." The DASH eating plan is a healthy eating plan that has been shown to reduce high blood pressure (hypertension). It may also reduce your risk for type 2 diabetes, heart disease, and stroke. The DASH eating plan may also help with weight loss. What are tips for following this plan?  General guidelines  Avoid eating more than 2,300 mg (milligrams) of salt (sodium) a day. If you have hypertension, you may need to reduce your sodium intake to 1,500 mg a day.  Limit alcohol intake to no more than 1 drink a day for nonpregnant women and 2 drinks a day for men. One drink equals 12 oz of beer, 5 oz of wine, or 1 oz of hard liquor.  Work with your health care provider to maintain a healthy body weight or to lose weight. Ask what an ideal weight is for you.  Get at least 30 minutes of exercise that causes your heart to beat faster (aerobic exercise) most days of the week. Activities may include walking, swimming, or biking.  Work with your health care provider or diet and nutrition specialist (dietitian) to adjust your eating plan to your individual calorie needs. Reading food labels   Check food labels for the amount of sodium per serving. Choose foods with less than 5 percent of the Daily Value of sodium. Generally, foods with less than 300 mg of sodium per serving fit into this eating plan.  To find whole grains, look for the word "whole" as the first word in the ingredient list. Shopping  Buy products labeled as "low-sodium" or "no salt added."  Buy fresh foods. Avoid canned foods and premade or frozen meals. Cooking  Avoid adding salt when cooking. Use salt-free seasonings or herbs instead of table salt or sea salt. Check with your health care provider or pharmacist before using salt substitutes.  Do not fry foods. Cook foods using healthy methods such as baking, boiling, grilling, and broiling instead.  Cook with  heart-healthy oils, such as olive, canola, soybean, or sunflower oil. Meal planning  Eat a balanced diet that includes: ? 5 or more servings of fruits and vegetables each day. At each meal, try to fill half of your plate with fruits and vegetables. ? Up to 6-8 servings of whole grains each day. ? Less than 6 oz of lean meat, poultry, or fish each day. A 3-oz serving of meat is about the same size as a deck of cards. One egg equals 1 oz. ? 2 servings of low-fat dairy each day. ? A serving of nuts, seeds, or beans 5 times each week. ? Heart-healthy fats. Healthy fats called Omega-3 fatty acids are found in foods such as flaxseeds and coldwater fish, like sardines, salmon, and mackerel.  Limit how much you eat of the following: ? Canned or prepackaged foods. ? Food that is high in trans fat, such as fried foods. ? Food that is high in saturated fat, such as fatty meat. ? Sweets, desserts, sugary drinks, and other foods with added sugar. ? Full-fat dairy products.  Do not salt foods before eating.  Try to eat at least 2 vegetarian meals each week.  Eat more home-cooked food and less restaurant, buffet, and fast food.  When eating at a restaurant, ask that your food be prepared with less salt or no salt, if possible. What foods are recommended? The items listed may not be a complete list. Talk with your dietitian about   what dietary choices are best for you. Grains Whole-grain or whole-wheat bread. Whole-grain or whole-wheat pasta. Brown rice. Oatmeal. Quinoa. Bulgur. Whole-grain and low-sodium cereals. Pita bread. Low-fat, low-sodium crackers. Whole-wheat flour tortillas. Vegetables Fresh or frozen vegetables (raw, steamed, roasted, or grilled). Low-sodium or reduced-sodium tomato and vegetable juice. Low-sodium or reduced-sodium tomato sauce and tomato paste. Low-sodium or reduced-sodium canned vegetables. Fruits All fresh, dried, or frozen fruit. Canned fruit in natural juice (without  added sugar). Meat and other protein foods Skinless chicken or turkey. Ground chicken or turkey. Pork with fat trimmed off. Fish and seafood. Egg whites. Dried beans, peas, or lentils. Unsalted nuts, nut butters, and seeds. Unsalted canned beans. Lean cuts of beef with fat trimmed off. Low-sodium, lean deli meat. Dairy Low-fat (1%) or fat-free (skim) milk. Fat-free, low-fat, or reduced-fat cheeses. Nonfat, low-sodium ricotta or cottage cheese. Low-fat or nonfat yogurt. Low-fat, low-sodium cheese. Fats and oils Soft margarine without trans fats. Vegetable oil. Low-fat, reduced-fat, or light mayonnaise and salad dressings (reduced-sodium). Canola, safflower, olive, soybean, and sunflower oils. Avocado. Seasoning and other foods Herbs. Spices. Seasoning mixes without salt. Unsalted popcorn and pretzels. Fat-free sweets. What foods are not recommended? The items listed may not be a complete list. Talk with your dietitian about what dietary choices are best for you. Grains Baked goods made with fat, such as croissants, muffins, or some breads. Dry pasta or rice meal packs. Vegetables Creamed or fried vegetables. Vegetables in a cheese sauce. Regular canned vegetables (not low-sodium or reduced-sodium). Regular canned tomato sauce and paste (not low-sodium or reduced-sodium). Regular tomato and vegetable juice (not low-sodium or reduced-sodium). Pickles. Olives. Fruits Canned fruit in a light or heavy syrup. Fried fruit. Fruit in cream or butter sauce. Meat and other protein foods Fatty cuts of meat. Ribs. Fried meat. Bacon. Sausage. Bologna and other processed lunch meats. Salami. Fatback. Hotdogs. Bratwurst. Salted nuts and seeds. Canned beans with added salt. Canned or smoked fish. Whole eggs or egg yolks. Chicken or turkey with skin. Dairy Whole or 2% milk, cream, and half-and-half. Whole or full-fat cream cheese. Whole-fat or sweetened yogurt. Full-fat cheese. Nondairy creamers. Whipped toppings.  Processed cheese and cheese spreads. Fats and oils Butter. Stick margarine. Lard. Shortening. Ghee. Bacon fat. Tropical oils, such as coconut, palm kernel, or palm oil. Seasoning and other foods Salted popcorn and pretzels. Onion salt, garlic salt, seasoned salt, table salt, and sea salt. Worcestershire sauce. Tartar sauce. Barbecue sauce. Teriyaki sauce. Soy sauce, including reduced-sodium. Steak sauce. Canned and packaged gravies. Fish sauce. Oyster sauce. Cocktail sauce. Horseradish that you find on the shelf. Ketchup. Mustard. Meat flavorings and tenderizers. Bouillon cubes. Hot sauce and Tabasco sauce. Premade or packaged marinades. Premade or packaged taco seasonings. Relishes. Regular salad dressings. Where to find more information:  National Heart, Lung, and Blood Institute: www.nhlbi.nih.gov  American Heart Association: www.heart.org Summary  The DASH eating plan is a healthy eating plan that has been shown to reduce high blood pressure (hypertension). It may also reduce your risk for type 2 diabetes, heart disease, and stroke.  With the DASH eating plan, you should limit salt (sodium) intake to 2,300 mg a day. If you have hypertension, you may need to reduce your sodium intake to 1,500 mg a day.  When on the DASH eating plan, aim to eat more fresh fruits and vegetables, whole grains, lean proteins, low-fat dairy, and heart-healthy fats.  Work with your health care provider or diet and nutrition specialist (dietitian) to adjust your eating plan to your   individual calorie needs. This information is not intended to replace advice given to you by your health care provider. Make sure you discuss any questions you have with your health care provider. Document Revised: 01/07/2017 Document Reviewed: 01/19/2016 Elsevier Patient Education  2020 Elsevier Inc.  

## 2019-09-26 LAB — CBC
Hematocrit: 38.4 % (ref 37.5–51.0)
Hemoglobin: 13.3 g/dL (ref 13.0–17.7)
MCH: 30 pg (ref 26.6–33.0)
MCHC: 34.6 g/dL (ref 31.5–35.7)
MCV: 87 fL (ref 79–97)
Platelets: 231 10*3/uL (ref 150–450)
RBC: 4.43 x10E6/uL (ref 4.14–5.80)
RDW: 12.7 % (ref 11.6–15.4)
WBC: 5.3 10*3/uL (ref 3.4–10.8)

## 2019-09-26 LAB — LIPID PANEL
Chol/HDL Ratio: 3.6 ratio (ref 0.0–5.0)
Cholesterol, Total: 185 mg/dL (ref 100–199)
HDL: 51 mg/dL (ref >39)
LDL Chol Calc (NIH): 111 mg/dL — ABNORMAL HIGH (ref 0–99)
Triglycerides: 130 mg/dL (ref 0–149)
VLDL Cholesterol Cal: 23 mg/dL (ref 5–40)

## 2019-09-26 LAB — COMPREHENSIVE METABOLIC PANEL
ALT: 23 IU/L (ref 0–44)
AST: 20 IU/L (ref 0–40)
Albumin/Globulin Ratio: 2.1 (ref 1.2–2.2)
Albumin: 4.6 g/dL (ref 4.0–5.0)
Alkaline Phosphatase: 99 IU/L (ref 48–121)
BUN/Creatinine Ratio: 21 — ABNORMAL HIGH (ref 9–20)
BUN: 16 mg/dL (ref 6–24)
Bilirubin Total: 0.6 mg/dL (ref 0.0–1.2)
CO2: 27 mmol/L (ref 20–29)
Calcium: 10.3 mg/dL — ABNORMAL HIGH (ref 8.7–10.2)
Chloride: 102 mmol/L (ref 96–106)
Creatinine, Ser: 0.75 mg/dL — ABNORMAL LOW (ref 0.76–1.27)
GFR calc Af Amer: 125 mL/min/{1.73_m2} (ref >59)
GFR calc non Af Amer: 108 mL/min/{1.73_m2} (ref >59)
Globulin, Total: 2.2 g/dL (ref 1.5–4.5)
Glucose: 78 mg/dL (ref 65–99)
Potassium: 4.4 mmol/L (ref 3.5–5.2)
Sodium: 143 mmol/L (ref 134–144)
Total Protein: 6.8 g/dL (ref 6.0–8.5)

## 2019-09-26 LAB — HEMOGLOBIN A1C
Est. average glucose Bld gHb Est-mCnc: 94 mg/dL
Hgb A1c MFr Bld: 4.9 % (ref 4.8–5.6)

## 2019-09-26 LAB — VITAMIN D 25 HYDROXY (VIT D DEFICIENCY, FRACTURES): Vit D, 25-Hydroxy: 50.8 ng/mL (ref 30.0–100.0)

## 2019-09-26 LAB — TSH: TSH: 2.19 u[IU]/mL (ref 0.450–4.500)

## 2019-10-08 ENCOUNTER — Other Ambulatory Visit: Payer: Self-pay | Admitting: Family Medicine

## 2019-10-08 DIAGNOSIS — L649 Androgenic alopecia, unspecified: Secondary | ICD-10-CM

## 2019-10-10 ENCOUNTER — Other Ambulatory Visit: Payer: Self-pay | Admitting: Family Medicine

## 2019-10-10 DIAGNOSIS — I10 Essential (primary) hypertension: Secondary | ICD-10-CM

## 2019-11-29 ENCOUNTER — Other Ambulatory Visit: Payer: Self-pay | Admitting: Family Medicine

## 2019-11-29 DIAGNOSIS — G43809 Other migraine, not intractable, without status migrainosus: Secondary | ICD-10-CM

## 2019-11-29 DIAGNOSIS — I10 Essential (primary) hypertension: Secondary | ICD-10-CM

## 2019-12-03 ENCOUNTER — Telehealth: Payer: Self-pay

## 2019-12-03 MED ORDER — HYDROCHLOROTHIAZIDE 12.5 MG PO TABS: 12.5000 mg | ORAL_TABLET | Freq: Every day | ORAL | 0 refills | Status: DC

## 2019-12-03 MED ORDER — VALSARTAN 160 MG PO TABS: 160.0000 mg | ORAL_TABLET | Freq: Every day | ORAL | 0 refills | Status: DC

## 2019-12-03 MED ORDER — AMLODIPINE BESYLATE 5 MG PO TABS: 5.0000 mg | ORAL_TABLET | Freq: Every day | ORAL | 0 refills | Status: DC

## 2019-12-03 NOTE — Telephone Encounter (Signed)
Refill sent to pharmacy.  Charyl Bigger, CMA

## 2019-12-04 ENCOUNTER — Telehealth: Payer: Self-pay | Admitting: Physician Assistant

## 2019-12-04 DIAGNOSIS — I10 Essential (primary) hypertension: Secondary | ICD-10-CM

## 2019-12-04 DIAGNOSIS — G43809 Other migraine, not intractable, without status migrainosus: Secondary | ICD-10-CM

## 2019-12-04 MED ORDER — SUMATRIPTAN SUCCINATE 100 MG PO TABS: ORAL_TABLET | ORAL | 0 refills | Status: DC

## 2019-12-04 MED ORDER — VERAPAMIL HCL ER 120 MG PO TBCR: 120.0000 mg | EXTENDED_RELEASE_TABLET | Freq: Two times a day (BID) | ORAL | 0 refills | Status: DC

## 2019-12-04 NOTE — Telephone Encounter (Signed)
Patient needs a refill on sumapatriptan, and verapamil. I advised him it appears he needs appointments for refills but he wanted me to check with nurse.Please let me know thanks. He uses the mail service.

## 2019-12-04 NOTE — Addendum Note (Signed)
Addended by: Fonnie Mu on: 12/04/2019 10:55 AM   Modules accepted: Orders

## 2019-12-04 NOTE — Telephone Encounter (Signed)
Pt informed of RXs.  Charyl Bigger, CMA

## 2020-01-16 ENCOUNTER — Other Ambulatory Visit: Payer: Self-pay | Admitting: Physician Assistant

## 2020-01-16 DIAGNOSIS — I10 Essential (primary) hypertension: Secondary | ICD-10-CM

## 2020-01-23 ENCOUNTER — Encounter: Payer: Self-pay | Admitting: Physician Assistant

## 2020-01-23 ENCOUNTER — Other Ambulatory Visit: Payer: Self-pay | Admitting: Physician Assistant

## 2020-01-23 ENCOUNTER — Ambulatory Visit (INDEPENDENT_AMBULATORY_CARE_PROVIDER_SITE_OTHER): Payer: BC Managed Care – PPO | Admitting: Physician Assistant

## 2020-01-23 ENCOUNTER — Other Ambulatory Visit: Payer: Self-pay

## 2020-01-23 VITALS — BP 116/75 | HR 63 | Ht 71.0 in | Wt 192.6 lb

## 2020-01-23 DIAGNOSIS — Z1211 Encounter for screening for malignant neoplasm of colon: Secondary | ICD-10-CM

## 2020-01-23 DIAGNOSIS — I10 Essential (primary) hypertension: Secondary | ICD-10-CM | POA: Diagnosis not present

## 2020-01-23 DIAGNOSIS — Z0000 Encounter for general adult medical examination without abnormal findings: Secondary | ICD-10-CM | POA: Diagnosis not present

## 2020-01-23 DIAGNOSIS — E7800 Pure hypercholesterolemia, unspecified: Secondary | ICD-10-CM | POA: Diagnosis not present

## 2020-01-23 DIAGNOSIS — G43809 Other migraine, not intractable, without status migrainosus: Secondary | ICD-10-CM

## 2020-01-23 MED ORDER — AMLODIPINE BESYLATE 5 MG PO TABS
5.0000 mg | ORAL_TABLET | Freq: Every day | ORAL | 0 refills | Status: DC
Start: 2020-01-23 — End: 2020-04-10

## 2020-01-23 MED ORDER — VALSARTAN 160 MG PO TABS
160.0000 mg | ORAL_TABLET | Freq: Every day | ORAL | 0 refills | Status: DC
Start: 2020-01-23 — End: 2020-04-10

## 2020-01-23 MED ORDER — HYDROCHLOROTHIAZIDE 12.5 MG PO TABS
12.5000 mg | ORAL_TABLET | Freq: Every day | ORAL | 0 refills | Status: DC
Start: 2020-01-23 — End: 2020-04-10

## 2020-01-23 NOTE — Progress Notes (Signed)
Male physical   Impression and Recommendations:    1. Healthcare maintenance   2. Screening for colon cancer   3. Hypertension, unspecified type   4. Elevated LDL cholesterol level      1) Anticipatory Guidance: Skin CA prevention- recommend to use sunscreen when outside along with skin surveillance; eating a balanced and modest diet; physical activity at least 25 minutes per day or minimum of 150 min/ week moderate to intense activity.  2) Immunizations / Screenings / Labs:   All immunizations are up-to-date per recommendations or will be updated today if pt allows.    - Patient understands with dental and vision screens they will schedule independently.  - UTD on FBW- repeating lipid panel and CMP. Added PSA.  - UTD on tdap, HIV screening, Influenza vaccine, completed Covid series. - Agreeable to colonoscopy.  3) Weight:  Recommend to continue to improve diet habits to improve overall feelings of well being and objective health data. Improve nutrient density of diet through increasing intake of fruits and vegetables and decreasing saturated fats, white flour products and refined sugars.  -BMI 26.86  4) Healthcare Maintenance: -Continue current medication regimen. -Continue with dietary changes. -Continue with meditation for mood management. Patient prefers nonpharmacologic therapy. -Stay well hydrated. -Follow up in 4-5 months for HTN, migraines, mood  Orders Placed This Encounter  Procedures  . Comprehensive metabolic panel    Order Specific Question:   Has the patient fasted?    Answer:   Yes  . Lipid panel    Order Specific Question:   Has the patient fasted?    Answer:   Yes  . PSA  . Ambulatory referral to Gastroenterology    Referral Priority:   Routine    Referral Type:   Consultation    Referral Reason:   Specialty Services Required    Number of Visits Requested:   1    Meds ordered this encounter  Medications  . amLODipine (NORVASC) 5 MG tablet     Sig: Take 1 tablet (5 mg total) by mouth daily.    Dispense:  90 tablet    Refill:  0  . hydrochlorothiazide (HYDRODIURIL) 12.5 MG tablet    Sig: Take 1 tablet (12.5 mg total) by mouth daily.    Dispense:  90 tablet    Refill:  0  . valsartan (DIOVAN) 160 MG tablet    Sig: Take 1 tablet (160 mg total) by mouth daily.    Dispense:  90 tablet    Refill:  0     Return in 5 months (on 06/22/2020) for HTN, migraine.     Gross side effects, risk and benefits, and alternatives of medications discussed with patient.  Patient is aware that all medications have potential side effects and we are unable to predict every side effect or drug-drug interaction that may occur.  Expresses verbal understanding and consents to current therapy plan and treatment regimen.  Please see AVS handed out to patient at the end of our visit for further patient instructions/ counseling done pertaining to today's office visit.       Subjective:        CC: CPE   HPI: Paul Schroeder is a 49 y.o. male who presents to [place] [facility] today for a yearly health maintenance exam.     Health Maintenance Summary  - Reviewed and updated, unless pt declines services.  Last Cologuard or Colonoscopy:   Placed order for  screening colonoscopy. Family history of Colon CA: N  Tobacco History Reviewed: Y, former smoker Abdominal Ultrasound:  N/A CT scan for screening lung CA:  N/A Alcohol / drug use:   No concerns, no excessive use / no current use Dental Home:  Y Eye exams:Y Male history: STD concerns:   none Additional penile/ urinary concerns: none   Additional concerns beyond Health Maintenance issues:  none    Immunization History  Administered Date(s) Administered  . Influenza,inj,Quad PF,6+ Mos 12/24/2018  . Influenza-Unspecified 11/08/2019  . PFIZER SARS-COV-2 Vaccination 04/21/2019, 05/15/2019, 11/29/2019  . Tdap 05/25/2011     Health Maintenance  Topic Date  Due  . Hepatitis C Screening  Never done  . COVID-19 Vaccine (4 - Booster for Pfizer series) 05/29/2020  . TETANUS/TDAP  05/24/2021  . INFLUENZA VACCINE  Completed  . HIV Screening  Completed       Wt Readings from Last 3 Encounters:  01/23/20 192 lb 9.6 oz (87.4 kg)  09/25/19 185 lb 4.8 oz (84.1 kg)  12/11/18 192 lb 11.2 oz (87.4 kg)   BP Readings from Last 3 Encounters:  01/23/20 116/75  09/25/19 136/82  11/28/18 126/85   Pulse Readings from Last 3 Encounters:  01/23/20 63  09/25/19 62  11/28/18 75    Patient Active Problem List   Diagnosis Date Noted  . Rhinosinusitis 11/28/2018  . Elevated LDL cholesterol level 09/28/2018  . Current non-drinker of alcohol 09/28/2018  .  (BMI 25.0-29.9) 03/02/2018  . 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 Insomnia 01/13/2018  . Medication monitoring encounter 07/13/2017  . Family history of hyperlipidemia 07/13/2017  . Health education/counseling 07/13/2017  . Erectile dysfunction 02/25/2017  . Vitamin D insufficiency 02/25/2017  . Anxious mood 09/10/2016  . Gastroesophageal reflux disease 09/10/2016  . HTN, goal below 130/80 09/10/2016  . 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 HTN (hypertension) 08/12/2016  . 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 )  08/12/2016  . Environmental and seasonal allergies 08/12/2016  . Seasonal allergic rhinitis 08/12/2016  . H/O non anemic vitamin B12 deficiency 08/12/2016  . H/O iron deficiency anemia 08/12/2016  . Tobacco use disorder- approximately 28-pack-year history- NOW quit even vaping! 08/12/2016  . Encounter for counseling for tobacco use disorder 08/12/2016  . History of panic attacks 08/12/2016  . Family history of mood disorder- MOM 08/12/2016  . Adjustment disorder with mixed anxiety and depressed mood- since teenager 08/12/2016  . Melanoma of skin ([place]) 08/12/2016  . Dysuria 10/07/2015  . Pelvic floor dysfunction- treated by urology 04/22/2015  . Right groin pain 03/05/2015   . Hematospermia 02/22/2015  . Epididymitis, right 02/22/2015    Past Medical History:  Diagnosis Date  . Allergy   . Anxiety   . Bronchitis   . Cancer [facility]) 2013   Malignant mole on his back removed   . Hematospermia   . Hemorrhoids   . Migraines    family Hx, triggered by stress  . Seasonal allergies     Past Surgical History:  Procedure Laterality Date  . skin lesions removed      Family History  Problem Relation Age of Onset  . Hypertension Mother   . Hyperlipidemia Mother   . Stroke Mother   . Depression Mother   . Kidney disease Brother   . Kidney disease Maternal Grandfather   . Heart disease Maternal Grandfather   . Prostate cancer Neg Hx     Social History   Substance and Sexual Activity  Drug  Use Yes  . Frequency: 2.0 times per week   Comment: marijuana  ,  Social History   Substance and Sexual Activity  Alcohol Use Yes  . Alcohol/week: 4.0 standard drinks  . Types: 4 Shots of liquor per week  ,  Social History   Tobacco Use  Smoking Status Former Smoker  . Packs/day: 1.00  . Years: 24.00  . Pack years: 24.00  . Types: Cigarettes  . Start date: 21  . Quit date: 2017  . Years since quitting: 4.9  Smokeless Tobacco Never Used  ,  Social History   Substance and Sexual Activity  Sexual Activity Not on file    Patient's Medications  New Prescriptions   No medications on file  Previous Medications   BIOTIN 5000 PO    Take by mouth.   FINASTERIDE (PROSCAR) 5 MG TABLET    Take 1/4 tablet by mouth daily   FLUTICASONE (FLONASE) 50 MCG/ACT NASAL SPRAY    Place 1 spray into both nostrils daily as needed for allergies or rhinitis.   MONTELUKAST (SINGULAIR) 10 MG TABLET    Take 1 tablet (10 mg total) by mouth at bedtime. **PATIENT NEEDS APT FOR FURTHER REFILLS**   MULTIPLE VITAMIN (ONE-A-DAY ESSENTIAL PO)    Take by mouth.   PROBIOTIC PRODUCT (FORTIFY DAILY PROBIOTIC PO)    Take by mouth.   SUMATRIPTAN (IMITREX) 100 MG TABLET    TAKE 1  TABLET BY MOUTH AS  NEEDED FOR MIGRAINE. MAY  REPEAT IN 2 HOURS IF  HEADACHE PERSISTS OR  RECURS.   TRIAMCINOLONE (NASACORT) 55 MCG/ACT AERO NASAL INHALER    Place 2 sprays into the nose at bedtime. Into each nostril   VERAPAMIL (CALAN-SR) 120 MG CR TABLET    Take 1 tablet (120 mg total) by mouth 2 (two) times daily.  Modified Medications   Modified Medication Previous Medication   AMLODIPINE (NORVASC) 5 MG TABLET amLODipine (NORVASC) 5 MG tablet      Take 1 tablet (5 mg total) by mouth daily.    Take 1 tablet (5 mg total) by mouth daily.   HYDROCHLOROTHIAZIDE (HYDRODIURIL) 12.5 MG TABLET hydrochlorothiazide (HYDRODIURIL) 12.5 MG tablet      Take 1 tablet (12.5 mg total) by mouth daily.    Take 1 tablet (12.5 mg total) by mouth daily.   VALSARTAN (DIOVAN) 160 MG TABLET valsartan (DIOVAN) 160 MG tablet      Take 1 tablet (160 mg total) by mouth daily.    Take 1 tablet (160 mg total) by mouth daily.  Discontinued Medications   No medications on file    Patient has no known allergies.  Review of Systems: General:   Denies fever, chills, unexplained weight loss.  Optho/Auditory:   Denies visual changes, blurred vision/LOV Respiratory:   Denies SOB, DOE more than baseline levels.   Cardiovascular:   Denies chest pain, palpitations, new onset peripheral edema  Gastrointestinal:   Denies nausea, vomiting, diarrhea.  Genitourinary: Denies dysuria, freq/ urgency, flank pain or discharge from genitals.  Endocrine:     Denies hot or cold intolerance, polyuria, polydipsia. Musculoskeletal:   Denies unexplained myalgias, joint swelling, unexplained arthralgias, gait problems.  Skin:  Denies rash, suspicious lesions Neurological:     Denies dizziness, unexplained weakness, numbness  Psychiatric/Behavioral:   Denies mood changes, suicidal or homicidal ideations, hallucinations    Objective:     Blood pressure 116/75, pulse 63, height 5\' 11"  (1.803 m), weight 192 lb 9.6 oz (87.4 kg), SpO2 100  %.  Body mass index is 26.86 kg/m. General Appearance:    Alert, cooperative, no distress, appears stated age  Head:    Normocephalic, without obvious abnormality, atraumatic  Eyes:    PERRL, conjunctiva/corneas clear, EOM's intact, both eyes  Ears:    Normal TM's and external ear canals, both ears  Nose:   Nares normal, septum midline, mucosa normal, no drainage    or sinus tenderness  Throat:   Lips w/o lesion, mucosa moist, and tongue normal; teeth and gums normal  Neck:   Supple, symmetrical, trachea midline, no adenopathy; no thyromegaly; no JVD  Back:     Symmetric, no curvature, ROM normal, no CVA tenderness  Lungs:     Clear to auscultation bilaterally, respirations unlabored, no  Wh/ R/ R  Chest Wall:    No tenderness or gross deformity; normal excursion   Heart:    Regular rate and rhythm, S1 and S2 normal, no murmur, rub   or gallop  Abdomen:     Soft, non-tender, bowel sounds active all four quadrants, No G/R/R, no masses, no organomegaly  Genitalia:   Deferred. No concerns/sxs.  Rectal:   Deferred. No concerns/sxs.  Extremities:   Extremities normal, atraumatic, no cyanosis or gross edema  Pulses:   2+ and symmetric all extremities  Skin:   Warm, dry, Skin color, texture, turgor normal, no obvious rashes or lesions  M-Sk:   Ambulates * 4 w/o difficulty, no gross deformities, tone WNL  Neurologic:   CNII-XII grossly intact, normal strength, sensation and reflexes    Throughout Psych:  No HI/SI, judgement and insight good, Euthymic mood. Full Affect.

## 2020-01-23 NOTE — Patient Instructions (Signed)
Preventive Care 40-49 Years Old, Male °Preventive care refers to lifestyle choices and visits with your health care provider that can promote health and wellness. This includes: °· A yearly physical exam. This is also called an annual well check. °· Regular dental and eye exams. °· Immunizations. °· Screening for certain conditions. °· Healthy lifestyle choices, such as eating a healthy diet, getting regular exercise, not using drugs or products that contain nicotine and tobacco, and limiting alcohol use. °What can I expect for my preventive care visit? °Physical exam °Your health care provider will check: °· Height and weight. These may be used to calculate body mass index (BMI), which is a measurement that tells if you are at a healthy weight. °· Heart rate and blood pressure. °· Your skin for abnormal spots. °Counseling °Your health care provider may ask you questions about: °· Alcohol, tobacco, and drug use. °· Emotional well-being. °· Home and relationship well-being. °· Sexual activity. °· Eating habits. °· Work and work environment. °What immunizations do I need? ° °Influenza (flu) vaccine °· This is recommended every year. °Tetanus, diphtheria, and pertussis (Tdap) vaccine °· You may need a Td booster every 10 years. °Varicella (chickenpox) vaccine °· You may need this vaccine if you have not already been vaccinated. °Zoster (shingles) vaccine °· You may need this after age 60. °Measles, mumps, and rubella (MMR) vaccine °· You may need at least one dose of MMR if you were born in 1957 or later. You may also need a second dose. °Pneumococcal conjugate (PCV13) vaccine °· You may need this if you have certain conditions and were not previously vaccinated. °Pneumococcal polysaccharide (PPSV23) vaccine °· You may need one or two doses if you smoke cigarettes or if you have certain conditions. °Meningococcal conjugate (MenACWY) vaccine °· You may need this if you have certain conditions. °Hepatitis A  vaccine °· You may need this if you have certain conditions or if you travel or work in places where you may be exposed to hepatitis A. °Hepatitis B vaccine °· You may need this if you have certain conditions or if you travel or work in places where you may be exposed to hepatitis B. °Haemophilus influenzae type b (Hib) vaccine °· You may need this if you have certain risk factors. °Human papillomavirus (HPV) vaccine °· If recommended by your health care provider, you may need three doses over 6 months. °You may receive vaccines as individual doses or as more than one vaccine together in one shot (combination vaccines). Talk with your health care provider about the risks and benefits of combination vaccines. °What tests do I need? °Blood tests °· Lipid and cholesterol levels. These may be checked every 5 years, or more frequently if you are over 50 years old. °· Hepatitis C test. °· Hepatitis B test. °Screening °· Lung cancer screening. You may have this screening every year starting at age 55 if you have a 30-pack-year history of smoking and currently smoke or have quit within the past 15 years. °· Prostate cancer screening. Recommendations will vary depending on your family history and other risks. °· Colorectal cancer screening. All adults should have this screening starting at age 50 and continuing until age 75. Your health care provider may recommend screening at age 45 if you are at increased risk. You will have tests every 1-10 years, depending on your results and the type of screening test. °· Diabetes screening. This is done by checking your blood sugar (glucose) after you have not eaten   for a while (fasting). You may have this done every 1-3 years.  Sexually transmitted disease (STD) testing. Follow these instructions at home: Eating and drinking  Eat a diet that includes fresh fruits and vegetables, whole grains, lean protein, and low-fat dairy products.  Take vitamin and mineral supplements as  recommended by your health care provider.  Do not drink alcohol if your health care provider tells you not to drink.  If you drink alcohol: ? Limit how much you have to 0-2 drinks a day. ? Be aware of how much alcohol is in your drink. In the [place] one drink equals one 12 oz bottle of beer (355 mL), one 5 oz glass of wine (148 mL), or one 1 oz glass of hard liquor (44 mL). Lifestyle  Take daily care of your teeth and gums.  Stay active. Exercise for at least 30 minutes on 5 or more days each week.  Do not use any products that contain nicotine or tobacco, such as cigarettes, e-cigarettes, and chewing tobacco. If you need help quitting, ask your health care provider.  If you are sexually active, practice safe sex. Use a condom or other form of protection to prevent STIs (sexually transmitted infections).  Talk with your health care provider about taking a low-dose aspirin every day starting at age 50. What's next?  Go to your health care provider once a year for a well check visit.  Ask your health care provider how often you should have your eyes and teeth checked.  Stay up to date on all vaccines. This information is not intended to replace advice given to you by your health care provider. Make sure you discuss any questions you have with your health care provider. Document Revised: 01/19/2018 Document Reviewed: 01/19/2018 Elsevier Patient Education  2020 Elsevier Inc.  

## 2020-01-24 LAB — COMPREHENSIVE METABOLIC PANEL
ALT: 20 IU/L (ref 0–44)
AST: 13 IU/L (ref 0–40)
Albumin/Globulin Ratio: 1.9 (ref 1.2–2.2)
Albumin: 4.5 g/dL (ref 4.0–5.0)
Alkaline Phosphatase: 93 IU/L (ref 44–121)
BUN/Creatinine Ratio: 15 (ref 9–20)
BUN: 12 mg/dL (ref 6–24)
Bilirubin Total: 0.3 mg/dL (ref 0.0–1.2)
CO2: 26 mmol/L (ref 20–29)
Calcium: 9.8 mg/dL (ref 8.7–10.2)
Chloride: 102 mmol/L (ref 96–106)
Creatinine, Ser: 0.82 mg/dL (ref 0.76–1.27)
GFR calc Af Amer: 120 mL/min/{1.73_m2} (ref >59)
GFR calc non Af Amer: 104 mL/min/{1.73_m2} (ref >59)
Globulin, Total: 2.4 g/dL (ref 1.5–4.5)
Glucose: 82 mg/dL (ref 65–99)
Potassium: 4.1 mmol/L (ref 3.5–5.2)
Sodium: 145 mmol/L — ABNORMAL HIGH (ref 134–144)
Total Protein: 6.9 g/dL (ref 6.0–8.5)

## 2020-01-24 LAB — LIPID PANEL
Chol/HDL Ratio: 4.1 ratio (ref 0.0–5.0)
Cholesterol, Total: 182 mg/dL (ref 100–199)
HDL: 44 mg/dL (ref >39)
LDL Chol Calc (NIH): 116 mg/dL — ABNORMAL HIGH (ref 0–99)
Triglycerides: 124 mg/dL (ref 0–149)
VLDL Cholesterol Cal: 22 mg/dL (ref 5–40)

## 2020-01-24 LAB — PSA: Prostate Specific Ag, Serum: 0.4 ng/mL (ref 0.0–4.0)

## 2020-01-25 ENCOUNTER — Other Ambulatory Visit: Payer: Self-pay | Admitting: Physician Assistant

## 2020-01-25 DIAGNOSIS — I10 Essential (primary) hypertension: Secondary | ICD-10-CM

## 2020-01-29 ENCOUNTER — Telehealth (INDEPENDENT_AMBULATORY_CARE_PROVIDER_SITE_OTHER): Payer: Self-pay | Admitting: Gastroenterology

## 2020-01-29 DIAGNOSIS — Z1211 Encounter for screening for malignant neoplasm of colon: Secondary | ICD-10-CM

## 2020-01-29 NOTE — Progress Notes (Signed)
Gastroenterology Pre-Procedure Review  Request Date: 05/26/20 Requesting Physician: Dr. Vicente Males  PATIENT REVIEW QUESTIONS: The patient responded to the following health history questions as indicated:    1. Are you having any GI issues? no 2. Do you have a personal history of Polyps? no 3. Do you have a family history of Colon Cancer or Polyps? no 4. Diabetes Mellitus? no 5. Joint replacements in the past 12 months?no 6. Major health problems in the past 3 months?no 7. Any artificial heart valves, MVP, or defibrillator?no    MEDICATIONS & ALLERGIES:    Patient reports the following regarding taking any anticoagulation/antiplatelet therapy:   Plavix, Coumadin, Eliquis, Xarelto, Lovenox, Pradaxa, Brilinta, or Effient? no Aspirin? no  Patient confirms/reports the following medications:  Current Outpatient Medications  Medication Sig Dispense Refill   amLODipine (NORVASC) 5 MG tablet Take 1 tablet (5 mg total) by mouth daily. 90 tablet 0   BIOTIN 5000 PO Take by mouth.     finasteride (PROSCAR) 5 MG tablet Take 1/4 tablet by mouth daily 45 tablet 2   hydrochlorothiazide (HYDRODIURIL) 12.5 MG tablet Take 1 tablet (12.5 mg total) by mouth daily. 90 tablet 0   Multiple Vitamin (ONE-A-DAY ESSENTIAL PO) Take by mouth.     Probiotic Product (FORTIFY DAILY PROBIOTIC PO) Take by mouth.     SUMAtriptan (IMITREX) 100 MG tablet TAKE 1 TABLET BY MOUTH AS  NEEDED FOR MIGRAINE. MAY  REPEAT IN 2 HOURS IF  HEADACHE PERSISTS OR RECURS 10 tablet 0   triamcinolone (NASACORT) 55 MCG/ACT AERO nasal inhaler Place 2 sprays into the nose at bedtime. Into each nostril 302.4 mL 0   valsartan (DIOVAN) 160 MG tablet Take 1 tablet (160 mg total) by mouth daily. 90 tablet 0   verapamil (CALAN-SR) 120 MG CR tablet Take 1 tablet (120 mg total) by mouth 2 (two) times daily. 120 tablet 0   No current facility-administered medications for this visit.    Patient confirms/reports the following allergies:  No  Known Allergies  No orders of the defined types were placed in this encounter.   AUTHORIZATION INFORMATION Primary Insurance: 1D#: Group #:  Secondary Insurance: 1D#: Group #:  SCHEDULE INFORMATION: Date: 05/26/20 Time: Location:[facility]

## 2020-02-10 ENCOUNTER — Encounter: Payer: Self-pay | Admitting: Physician Assistant

## 2020-02-10 DIAGNOSIS — L649 Androgenic alopecia, unspecified: Secondary | ICD-10-CM

## 2020-02-11 MED ORDER — FINASTERIDE 5 MG PO TABS: ORAL_TABLET | ORAL | 2 refills | Status: DC

## 2020-02-19 ENCOUNTER — Other Ambulatory Visit: Payer: Self-pay | Admitting: Physician Assistant

## 2020-02-19 DIAGNOSIS — I10 Essential (primary) hypertension: Secondary | ICD-10-CM

## 2020-02-25 ENCOUNTER — Other Ambulatory Visit: Payer: Self-pay | Admitting: Physician Assistant

## 2020-02-25 DIAGNOSIS — G43809 Other migraine, not intractable, without status migrainosus: Secondary | ICD-10-CM

## 2020-02-26 ENCOUNTER — Telehealth: Payer: Self-pay | Admitting: Physician Assistant

## 2020-02-26 NOTE — Telephone Encounter (Signed)
Patient would like 90 day supply refills if possible on amlodipine, hydrochlorothiazide, sumatriptan, and valsartan and uses [facility]. Thanks

## 2020-02-26 NOTE — Telephone Encounter (Signed)
HCTZ, Amlodipine and Valsartan given 01/23/20 for 90 day supply. Too soon for refills.  Sumatriptan is not given as 90 tabs.

## 2020-02-27 NOTE — Telephone Encounter (Signed)
Patient called and made aware.

## 2020-04-10 ENCOUNTER — Other Ambulatory Visit: Payer: Self-pay | Admitting: Physician Assistant

## 2020-05-26 ENCOUNTER — Encounter
Admission: RE | Disposition: A | Discharge status: Home / Self Care | Payer: Self-pay | Source: Home / Self Care | Attending: Gastroenterology

## 2020-05-26 ENCOUNTER — Ambulatory Visit: Payer: BC Managed Care – PPO | Admitting: Anesthesiology

## 2020-05-26 ENCOUNTER — Encounter: Payer: Self-pay | Admitting: Gastroenterology

## 2020-05-26 ENCOUNTER — Ambulatory Visit
Admission: RE | Admit: 2020-05-26 | Discharge: 2020-05-26 | Disposition: A | Discharge status: Home / Self Care | Payer: BC Managed Care – PPO | Attending: Gastroenterology | Admitting: Gastroenterology

## 2020-05-26 DIAGNOSIS — Z87891 Personal history of nicotine dependence: Secondary | ICD-10-CM | POA: Diagnosis not present

## 2020-05-26 DIAGNOSIS — Z79899 Other long term (current) drug therapy: Secondary | ICD-10-CM | POA: Diagnosis not present

## 2020-05-26 DIAGNOSIS — Z85828 Personal history of other malignant neoplasm of skin: Secondary | ICD-10-CM | POA: Insufficient documentation

## 2020-05-26 DIAGNOSIS — D125 Benign neoplasm of sigmoid colon: Secondary | ICD-10-CM | POA: Insufficient documentation

## 2020-05-26 DIAGNOSIS — Z1211 Encounter for screening for malignant neoplasm of colon: Secondary | ICD-10-CM | POA: Diagnosis not present

## 2020-05-26 DIAGNOSIS — K635 Polyp of colon: Secondary | ICD-10-CM | POA: Diagnosis not present

## 2020-05-26 HISTORY — PX: COLONOSCOPY WITH PROPOFOL: SHX5780

## 2020-05-26 SURGERY — COLONOSCOPY WITH PROPOFOL
Anesthesia: General

## 2020-05-26 MED ORDER — LIDOCAINE HCL (CARDIAC) PF 100 MG/5ML IV SOSY
PREFILLED_SYRINGE | INTRAVENOUS | Status: DC | PRN
  Administered 2020-05-26: 80 mg via INTRAVENOUS

## 2020-05-26 MED ORDER — LIDOCAINE HCL (PF) 2 % IJ SOLN
INTRAMUSCULAR | Status: AC
  Filled 2020-05-26: qty 10

## 2020-05-26 MED ORDER — DEXAMETHASONE SODIUM PHOSPHATE 10 MG/ML IJ SOLN
INTRAMUSCULAR | Status: AC
  Filled 2020-05-26: qty 1

## 2020-05-26 MED ORDER — PROPOFOL 500 MG/50ML IV EMUL
INTRAVENOUS | Status: DC | PRN
  Administered 2020-05-26: 50 ug/kg/min via INTRAVENOUS

## 2020-05-26 MED ORDER — SUCCINYLCHOLINE CHLORIDE 200 MG/10ML IV SOSY
PREFILLED_SYRINGE | INTRAVENOUS | Status: AC
  Filled 2020-05-26: qty 10

## 2020-05-26 MED ORDER — PROPOFOL 500 MG/50ML IV EMUL
INTRAVENOUS | Status: AC
  Filled 2020-05-26: qty 50

## 2020-05-26 MED ORDER — PHENYLEPHRINE HCL (PRESSORS) 10 MG/ML IV SOLN
INTRAVENOUS | Status: AC
  Filled 2020-05-26: qty 3

## 2020-05-26 MED ORDER — MIDAZOLAM HCL 2 MG/2ML IJ SOLN
INTRAMUSCULAR | Status: DC | PRN
  Administered 2020-05-26: 2 mg via INTRAVENOUS

## 2020-05-26 MED ORDER — FENTANYL CITRATE (PF) 100 MCG/2ML IJ SOLN
INTRAMUSCULAR | Status: AC
  Filled 2020-05-26: qty 2

## 2020-05-26 MED ORDER — PROPOFOL 10 MG/ML IV BOLUS
INTRAVENOUS | Status: DC | PRN
  Administered 2020-05-26: 30 mg via INTRAVENOUS
  Administered 2020-05-26: 20 mg via INTRAVENOUS

## 2020-05-26 MED ORDER — FENTANYL CITRATE (PF) 100 MCG/2ML IJ SOLN
INTRAMUSCULAR | Status: DC | PRN
  Administered 2020-05-26 (×2): 50 ug via INTRAVENOUS

## 2020-05-26 MED ORDER — MIDAZOLAM HCL 2 MG/2ML IJ SOLN
INTRAMUSCULAR | Status: AC
  Filled 2020-05-26: qty 2

## 2020-05-26 MED ORDER — SODIUM CHLORIDE 0.9 % IV SOLN
INTRAVENOUS | Status: DC
  Administered 2020-05-26: 20 mL/h via INTRAVENOUS

## 2020-05-26 NOTE — Anesthesia Preprocedure Evaluation (Addendum)
Anesthesia Evaluation  Patient identified by MRN, date of birth, ID band Patient awake    Reviewed: Allergy & Precautions, H&P , NPO status , Patient's Chart, lab work & pertinent test results, reviewed documented beta blocker date and time   Airway Mallampati: II  TM Distance: >3 FB Neck ROM: full    Dental  (+) Caps, Dental Advidsory Given, Teeth Intact   Pulmonary neg pulmonary ROS, former smoker,    Pulmonary exam normal breath sounds clear to auscultation       Cardiovascular Exercise Tolerance: Good hypertension, On Medications (-) angina(-) Past MI and (-) Cardiac Stents Normal cardiovascular exam(-) dysrhythmias (-) Valvular Problems/Murmurs Rhythm:regular Rate:Normal     Neuro/Psych  Headaches, neg Seizures PSYCHIATRIC DISORDERS Anxiety    GI/Hepatic negative GI ROS, Neg liver ROS,   Endo/Other  negative endocrine ROS  Renal/GU negative Renal ROS  negative genitourinary   Musculoskeletal   Abdominal   Peds  Hematology negative hematology ROS (+)   Anesthesia Other Findings Past Medical History: No date: Allergy No date: Anxiety No date: Bronchitis 2013: Cancer ([facility])     Comment:  Malignant mole on his back removed  No date: Hematospermia No date: Hemorrhoids No date: Migraines     Comment:  family Hx, triggered by stress No date: Seasonal allergies   Reproductive/Obstetrics negative OB ROS                             Anesthesia Physical Anesthesia Plan  ASA: II  Anesthesia Plan: General   Post-op Pain Management:    Induction: Intravenous  PONV Risk Score and Plan: TIVA and Propofol infusion  Airway Management Planned: Natural Airway and Nasal Cannula  Additional Equipment:   Intra-op Plan:   Post-operative Plan:   Informed Consent: I have reviewed the patients History and Physical, chart, labs and discussed the procedure including the risks, benefits and  alternatives for the proposed anesthesia with the patient or authorized representative who has indicated his/her understanding and acceptance.     Dental Advisory Given  Plan Discussed with: Anesthesiologist, CRNA and Surgeon  Anesthesia Plan Comments:        Anesthesia Quick Evaluation

## 2020-05-26 NOTE — Anesthesia Postprocedure Evaluation (Signed)
Anesthesia Post Note  Patient: Denym Rahimi  Procedure(s) Performed: COLONOSCOPY WITH PROPOFOL (N/A )  Patient location during evaluation: Endoscopy Anesthesia Type: General Level of consciousness: awake and alert Pain management: pain level controlled Vital Signs Assessment: post-procedure vital signs reviewed and stable Respiratory status: spontaneous breathing, nonlabored ventilation, respiratory function stable and patient connected to nasal cannula oxygen Cardiovascular status: blood pressure returned to baseline and stable Postop Assessment: no apparent nausea or vomiting Anesthetic complications: no   No complications documented.   Last Vitals:  Vitals:   05/26/20 0810 05/26/20 0820  BP: 115/77 129/90  Pulse: 61 65  Resp: 16 13  Temp: (!) 36 C   SpO2: 100% 97%    Last Pain:  Vitals:   05/26/20 0820  TempSrc:   PainSc: 0-No pain                 Martha Clan

## 2020-05-26 NOTE — Transfer of Care (Signed)
Immediate Anesthesia Transfer of Care Note  Patient: Paul Schroeder  Procedure(s) Performed: COLONOSCOPY WITH PROPOFOL (N/A )  Patient Location: PACU  Anesthesia Type:General  Level of Consciousness: sedated  Airway & Oxygen Therapy: Patient Spontanous Breathing and Patient connected to nasal cannula oxygen  Post-op Assessment: Report given to RN and Post -op Vital signs reviewed and stable  Post vital signs: Reviewed and stable  Last Vitals:  Vitals Value Taken Time  BP 115/77 05/26/20 0810  Temp 36 C 05/26/20 0810  Pulse 59 05/26/20 0810  Resp 8 05/26/20 0810  SpO2 97 % 05/26/20 0810  Vitals shown include unvalidated device data.  Last Pain:  Vitals:   05/26/20 0810  TempSrc: Temporal  PainSc: 0-No pain         Complications: No complications documented.

## 2020-05-26 NOTE — Op Note (Signed)
[facility] Patient Name: Paul Schroeder Procedure Date: 05/26/2020 7:42 AM MRN: [id number] Account #: [id number] Date of Birth: 12/14/1970 Admit Type: Outpatient Age: 50 Room: [facility] ENDO ROOM 4 Gender: Male Note Status: Finalized Procedure:             Colonoscopy Indications:           Screening for colorectal malignant neoplasm Providers:             Jonathon Bellows MD, MD Referring MD:          Lorrene Reid (Referring MD) Medicines:             Monitored Anesthesia Care Complications:         No immediate complications. Procedure:             Pre-Anesthesia Assessment:                        - Prior to the procedure, a History and Physical was                         performed, and patient medications, allergies and                         sensitivities were reviewed. The patient's tolerance                         of previous anesthesia was reviewed.                        - The risks and benefits of the procedure and the                         sedation options and risks were discussed with the                         patient. All questions were answered and informed                         consent was obtained.                        - ASA Grade Assessment: II - A patient with mild                         systemic disease.                        After obtaining informed consent, the colonoscope was                         passed under direct vision. Throughout the procedure,                         the patient's blood pressure, pulse, and oxygen                         saturations were monitored continuously. The                         Colonoscope was introduced through the anus and  advanced to the the cecum, identified by the                         appendiceal orifice. The colonoscopy was performed                         with ease. The patient tolerated the procedure well.                         The quality of the  bowel preparation was excellent. Findings:      The perianal and digital rectal examinations were normal.      Two sessile polyps were found in the sigmoid colon. The polyps were 4 to       6 mm in size. These polyps were removed with a cold snare. Resection and       retrieval were complete.      The exam was otherwise without abnormality on direct and retroflexion       views. Impression:            - Two 4 to 6 mm polyps in the sigmoid colon, removed                         with a cold snare. Resected and retrieved.                        - The examination was otherwise normal on direct and                         retroflexion views. Recommendation:        - Discharge patient to home (with escort).                        - Resume previous diet.                        - Continue present medications.                        - Await pathology results.                        - Repeat colonoscopy for surveillance based on                         pathology results. Procedure Code(s):     --- Professional ---                        [phone], Colonoscopy, flexible; with removal of                         tumor(s), polyp(s), or other lesion(s) by snare                         technique Diagnosis Code(s):     --- Professional ---                        Z12.11, Encounter for screening for malignant neoplasm  of colon                        K63.5, Polyp of colon CPT copyright 2019 American Medical Association. All rights reserved. The codes documented in this report are preliminary and upon coder review may  be revised to meet current compliance requirements. Jonathon Bellows, MD Jonathon Bellows MD, MD 05/26/2020 8:07:42 AM This report has been signed electronically. Number of Addenda: 0 Note Initiated On: 05/26/2020 7:42 AM Scope Withdrawal Time: 0 hours 14 minutes 8 seconds  Total Procedure Duration: 0 hours 18 minutes 4 seconds  Estimated Blood Loss:  Estimated blood loss:  none.      [facility]

## 2020-05-26 NOTE — H&P (Signed)
Paul Bellows, MD [address], [place], [place], [place], [address] [address], [place], [place], [place], [address] Phone: [phone]  Fax: [phone]  Primary Care Physician:  Paul Reid, PA-C   Pre-Procedure History & Physical: HPI:  Paul Schroeder is a 50 y.o. male is here for an colonoscopy.   Past Medical History:  Diagnosis Date  . Allergy   . Anxiety   . Bronchitis   . Cancer [facility]) 2013   Malignant mole on his back removed   . Hematospermia   . Hemorrhoids   . Migraines    family Hx, triggered by stress  . Seasonal allergies     Past Surgical History:  Procedure Laterality Date  . skin lesions removed      Prior to Admission medications   Medication Sig Start Date End Date Taking? Authorizing Provider  amLODipine (NORVASC) 5 MG tablet TAKE 1 TABLET BY MOUTH  DAILY 04/10/20  Yes Paul Schroeder, Mar[place]za, PA-C  BIOTIN 5000 PO Take by mouth.   Yes [provider]  finasteride (PROSCAR) 5 MG tablet Take 1/4 tablet by mouth daily 02/11/20  Yes Paul Schroeder, Maritza, PA-C  hydrochlorothiazide (HYDRODIURIL) 12.5 MG tablet TAKE 1 TABLET BY MOUTH  DAILY 04/10/20  Yes Paul Schroeder, Maritza, PA-C  Multiple Vitamin (ONE-A-DAY ESSENTIAL PO) Take by mouth.   Yes [provider]  Probiotic Product (FORTIFY DAILY PROBIOTIC PO) Take by mouth.   Yes [provider]  SUMAtriptan (IMITREX) 100 MG tablet TAKE 1 TABLET BY MOUTH AS  NEEDED FOR MIGRAINE. MAY  REPEAT IN 2 HOURS IF  HEADACHE PERSISTS OR RECURS 01/24/20  Yes Paul Schroeder, Maritza, PA-C  triamcinolone (NASACORT) 55 MCG/ACT AERO nasal inhaler Place 2 sprays into the nose at bedtime. Into each nostril 12/11/18  Yes Rozetta Nunnery, MD  valsartan (DIOVAN) 160 MG tablet TAKE 1 TABLET BY MOUTH  DAILY 04/10/20  Yes Paul Schroeder, Maritza, PA-C  verapamil (CALAN-SR) 120 MG CR tablet Take 1 tablet (120 mg total) by mouth 2 (two) times daily. 02/20/20  Yes Paul Reid, PA-C    Allergies as of 01/29/2020  . (No  Known Allergies)    Family History  Problem Relation Age of Onset  . Hypertension Mother   . Hyperlipidemia Mother   . Stroke Mother   . Depression Mother   . Kidney disease Brother   . Kidney disease Maternal Grandfather   . Heart disease Maternal Grandfather   . Prostate cancer Neg Hx     Social History   Socioeconomic History  . Marital status: Married    Spouse name: Not on file  . Number of children: Not on file  . Years of education: Not on file  . Highest education level: Not on file  Occupational History  . Not on file  Tobacco Use  . Smoking status: Former Smoker    Packs/day: 1.00    Years: 24.00    Pack years: 24.00    Types: Cigarettes    Start date: 18    Quit date: 2017    Years since quitting: 5.2  . Smokeless tobacco: Never Used  Vaping Use  . Vaping Use: Never used  Substance and Sexual Activity  . Alcohol use: Yes    Alcohol/week: 4.0 standard drinks    Types: 4 Shots of liquor per week  . Drug use: Yes    Frequency: 2.0 times per week    Comment: marijuana  . Sexual activity: Not on file  Other Topics Concern  .  Not on file  Social History Narrative  . Not on file   Social Determinants of Health   Financial Resource Strain: Not on file  Food Insecurity: Not on file  Transportation Needs: Not on file  Physical Activity: Not on file  Stress: Not on file  Social Connections: Not on file  Intimate Partner Violence: Not on file    Review of Systems: See HPI, otherwise negative ROS  Physical Exam: BP (!) 124/97   Pulse 64   Temp (!) 96.4 F (35.8 C) (Temporal)   Resp 20   Ht 5\' 11"  (1.803 m)   Wt 88.5 kg   SpO2 100%   BMI 27.20 kg/m  General:   Alert,  pleasant and cooperative in NAD Head:  Normocephalic and atraumatic. Neck:  Supple; no masses or thyromegaly. Lungs:  Clear throughout to auscultation, normal respiratory effort.    Heart:  +S1, +S2, Regular rate and rhythm, No edema. Abdomen:  Soft, nontender and  nondistended. Normal bowel sounds, without guarding, and without rebound.   Neurologic:  Alert and  oriented x4;  grossly normal neurologically.  Impression/Plan: Paul Schroeder is here for an colonoscopy to be performed for Screening colonoscopy average risk   Risks, benefits, limitations, and alternatives regarding  colonoscopy have been reviewed with the patient.  Questions have been answered.  All parties agreeable.   Paul Bellows, MD  05/26/2020, 7:41 AM

## 2020-05-27 ENCOUNTER — Encounter: Payer: Self-pay | Admitting: Gastroenterology

## 2020-05-27 LAB — SURGICAL PATHOLOGY

## 2020-05-30 ENCOUNTER — Other Ambulatory Visit: Payer: Self-pay | Admitting: Physician Assistant

## 2020-05-30 DIAGNOSIS — G43809 Other migraine, not intractable, without status migrainosus: Secondary | ICD-10-CM

## 2020-06-23 ENCOUNTER — Encounter: Payer: Self-pay | Admitting: Physician Assistant

## 2020-06-23 ENCOUNTER — Other Ambulatory Visit: Payer: Self-pay

## 2020-06-23 ENCOUNTER — Ambulatory Visit (INDEPENDENT_AMBULATORY_CARE_PROVIDER_SITE_OTHER): Payer: BC Managed Care – PPO | Admitting: Physician Assistant

## 2020-06-23 VITALS — BP 112/72 | HR 62 | Temp 98.1°F | Ht 71.0 in | Wt 210.6 lb

## 2020-06-23 DIAGNOSIS — E7800 Pure hypercholesterolemia, unspecified: Secondary | ICD-10-CM

## 2020-06-23 DIAGNOSIS — I10 Essential (primary) hypertension: Secondary | ICD-10-CM

## 2020-06-23 DIAGNOSIS — Z131 Encounter for screening for diabetes mellitus: Secondary | ICD-10-CM | POA: Diagnosis not present

## 2020-06-23 DIAGNOSIS — G43101 Migraine with aura, not intractable, with status migrainosus: Secondary | ICD-10-CM

## 2020-06-23 DIAGNOSIS — E663 Overweight: Secondary | ICD-10-CM

## 2020-06-23 NOTE — Assessment & Plan Note (Signed)
-   Last lipid panel: Total cholesterol 182, triglycerides 124, HDL 44, LDL 116 -Recommend to follow a heart healthy diet.  Provided handout on Mediterranean diet. -Increase physical activity. -Will repeat lipid panel today.

## 2020-06-23 NOTE — Progress Notes (Signed)
 Established Patient Office Visit  Subjective:  Patient ID: Paul Schroeder, male    DOB: 08/01/1970  Age: 50 y.o. MRN: [id number]  CC:  Chief Complaint  Patient presents with  . Hypertension  . Migraine    HPI [name] presents for follow up on hypertension and migraine. Patient reports doing well, has no acute concerns.  HTN: Pt denies chest pain, palpitations, dizziness or lower extremity edema. Taking medication as directed without side effects. Checks BP at home in the mornings before he takes his medication and later in the afternoon and reports average readings range 130s/80s.   Migraine: Reports headaches have improved. Recently started eye drops which have helped with migraine. States has headache once-twice per week and takes Imitrex when needed which resolves headache.   Elevated LDL: Patient reports has changed from a low carbohydrate to low calorie diet. Is also trying to lose weight. Plans to be more active with outdoor activities such as kayaking.      Past Medical History:  Diagnosis Date  . Allergy   . Anxiety   . Bronchitis   . 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 Hematospermia   . Hemorrhoids   . Migraines    family Hx, triggered by stress  . Seasonal allergies     Past Surgical History:  Procedure Laterality Date  . COLONOSCOPY WITH PROPOFOL N/A 05/26/2020   Procedure: COLONOSCOPY WITH PROPOFOL;  Surgeon: Anna, Kiran, MD;  Location: [facility] ENDOSCOPY;  Service: Gastroenterology;  Laterality: N/A;  . skin lesions removed      Family History  Problem Relation Age of Onset  . Hypertension Mother   . Hyperlipidemia Mother   . Stroke Mother   . Depression Mother   . Kidney disease Brother   . Kidney disease Maternal Grandfather   . Heart disease Maternal Grandfather   . Prostate cancer Neg Hx     Social History   Socioeconomic History  . Marital status: Married    Spouse name: Not on file  . Number of  children: Not on file  . Years of education: Not on file  . Highest education level: Not on file  Occupational History  . Not on file  Tobacco Use  . Smoking status: Former Smoker    Packs/day: 1.00    Years: 24.00    Pack years: 24.00    Types: Cigarettes    Start date: 1983    Quit date: 2017    Years since quitting: 5.3  . Smokeless tobacco: Never Used  Vaping Use  . Vaping Use: Never used  Substance and Sexual Activity  . Alcohol use: Yes    Alcohol/week: 4.0 standard drinks    Types: 4 Shots of liquor per week  . Drug use: Yes    Frequency: 2.0 times per week    Comment: marijuana  . Sexual activity: Not on file  Other Topics Concern  . Not on file  Social History Narrative  . Not on file   Social Determinants of Health   Financial Resource Strain: Not on file  Food Insecurity: Not on file  Transportation Needs: Not on file  Physical Activity: Not on file  Stress: Not on file  Social Connections: Not on file  Intimate Partner Violence: Not on file    Outpatient Medications Prior to Visit  Medication Sig Dispense Refill  . amLODipine (NORVASC) 5 MG tablet TAKE 1 TABLET BY MOUTH  DAILY 90 tablet   0  . BIOTIN 5000 PO Take by mouth.    . finasteride (PROSCAR) 5 MG tablet Take 1/4 tablet by mouth daily 45 tablet 2  . hydrochlorothiazide (HYDRODIURIL) 12.5 MG tablet TAKE 1 TABLET BY MOUTH  DAILY 90 tablet 0  . Multiple Vitamin (ONE-A-DAY ESSENTIAL PO) Take by mouth.    . Probiotic Product (FORTIFY DAILY PROBIOTIC PO) Take by mouth.    . SUMAtriptan (IMITREX) 100 MG tablet TAKE 1 TABLET BY MOUTH AS  NEEDED FOR MIGRAINE. MAY  REPEAT IN 2 HOURS IF  HEADACHE PERSISTS OR RECURS 10 tablet 0  . triamcinolone (NASACORT) 55 MCG/ACT AERO nasal inhaler Place 2 sprays into the nose at bedtime. Into each nostril 302.4 mL 0  . valsartan (DIOVAN) 160 MG tablet TAKE 1 TABLET BY MOUTH  DAILY 90 tablet 0  . verapamil (CALAN-SR) 120 MG CR tablet Take 1 tablet (120 mg total) by mouth 2  (two) times daily. 180 tablet 1   No facility-administered medications prior to visit.    No Known Allergies  ROS Review of Systems A fourteen system review of systems was performed and found to be positive as per HPI.   Objective:    Physical Exam General:  Well Developed, well nourished, in no acute distress  Neuro:  Alert and oriented,  extra-ocular muscles intact  HEENT:  Normocephalic, atraumatic, neck supple Skin:  no gross rash, warm, pink. Cardiac:  RRR, S1 S2 wnl's, no murmur  Respiratory:  ECTA B/L, Not using accessory muscles, speaking in full sentences- unlabored. Vascular:  Ext warm, no cyanosis apprec.; cap RF less 2 sec. No edema  Psych:  No HI/SI, judgement and insight good, Euthymic mood. Full Affect.   BP 112/72   Pulse 62   Temp 98.1 F (36.7 C)   Ht 5' 11" (1.803 m)   Wt 210 lb 9.6 oz (95.5 kg)   SpO2 98%   BMI 29.37 kg/m  Wt Readings from Last 3 Encounters:  06/23/20 210 lb 9.6 oz (95.5 kg)  05/26/20 195 lb (88.5 kg)  01/23/20 192 lb 9.6 oz (87.4 kg)     Health Maintenance Due  Topic Date Due  . Hepatitis C Screening  Never done  . COVID-19 Vaccine (4 - Booster for Pfizer series) 02/29/2020    There are no preventive care reminders to display for this patient.  Lab Results  Component Value Date   TSH 2.190 09/25/2019   Lab Results  Component Value Date   WBC 5.3 09/25/2019   HGB 13.3 09/25/2019   HCT 38.4 09/25/2019   MCV 87 09/25/2019   PLT 231 09/25/2019   Lab Results  Component Value Date   NA 145 (H) 01/23/2020   K 4.1 01/23/2020   CO2 26 01/23/2020   GLUCOSE 82 01/23/2020   BUN 12 01/23/2020   CREATININE 0.82 01/23/2020   BILITOT 0.3 01/23/2020   ALKPHOS 93 01/23/2020   AST 13 01/23/2020   ALT 20 01/23/2020   PROT 6.9 01/23/2020   ALBUMIN 4.5 01/23/2020   CALCIUM 9.8 01/23/2020   Lab Results  Component Value Date   CHOL 182 01/23/2020   Lab Results  Component Value Date   HDL 44 01/23/2020   Lab Results   Component Value Date   LDLCALC 116 (H) 01/23/2020   Lab Results  Component Value Date   TRIG 124 01/23/2020   Lab Results  Component Value Date   CHOLHDL 4.1 01/23/2020   Lab Results  Component Value Date     HGBA1C 4.9 09/25/2019      Assessment & Plan:   Problem List Items Addressed This Visit      Cardiovascular and Mediastinum   HTN (hypertension) - Primary (Chronic)    -Controlled. -Continue current medication regimen. Will collect CMP for medication monitoring. -Continue ambulatory BP monitoring.       Relevant Orders   Comp Met (CMET)   CBC w/Diff   HgB A1c   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 )  (Chronic)    -Improved. -On verapamil and sumatriptan succinate. -Recommend to avoid potential headache triggers such as caffeine, chocolate, dehydration. -Will continue to monitor.        Other    (BMI 25.0-29.9) (Chronic)   Elevated LDL cholesterol level    - Last lipid panel: Total cholesterol 182, triglycerides 124, HDL 44, LDL 116 -Recommend to follow a heart healthy diet.  Provided handout on Mediterranean diet. -Increase physical activity. -Will repeat lipid panel today.      Relevant Orders   Lipid Profile   CBC w/Diff   HgB A1c    Other Visit Diagnoses    Screening for diabetes mellitus       Relevant Orders   HgB A1c     BMI 25.0-29.9: -Associated with hypertension and elevated LDL cholesterol. -Encourage to continue with dietary changes and increase physical activity, ultimate goal is 150 mins/wk of moderate physical activity.   No orders of the defined types were placed in this encounter.   Follow-up: Return for CPE and FBW in Dec 2022.   Note:  This note was prepared with assistance of Dragon voice recognition software. Occasional wrong-word or sound-a-like substitutions may have occurred due to the inherent limitations of voice recognition software.  Maritza Abonza, PA-C 

## 2020-06-23 NOTE — Patient Instructions (Signed)
Mediterranean Diet A Mediterranean diet refers to food and lifestyle choices that are based on the traditions of countries located on the [place]. This way of eating has been shown to help prevent certain conditions and improve outcomes for people who have chronic diseases, like kidney disease and heart disease. What are tips for following this plan? Lifestyle  Cook and eat meals together with your family, when possible.  Drink enough fluid to keep your urine clear or pale yellow.  Be physically active every day. This includes: ? Aerobic exercise like running or swimming. ? Leisure activities like gardening, walking, or housework.  Get 7-8 hours of sleep each night.  If recommended by your health care provider, drink red wine in moderation. This means 1 glass a day for nonpregnant women and 2 glasses a day for men. A glass of wine equals 5 oz (150 mL). Reading food labels  Check the serving size of packaged foods. For foods such as rice and pasta, the serving size refers to the amount of cooked product, not dry.  Check the total fat in packaged foods. Avoid foods that have saturated fat or trans fats.  Check the ingredients list for added sugars, such as corn syrup.   Shopping  At the grocery store, buy most of your food from the areas near the walls of the store. This includes: ? Fresh fruits and vegetables (produce). ? Grains, beans, nuts, and seeds. Some of these may be available in unpackaged forms or large amounts (in bulk). ? Fresh seafood. ? Poultry and eggs. ? Low-fat dairy products.  Buy whole ingredients instead of prepackaged foods.  Buy fresh fruits and vegetables in-season from local farmers markets.  Buy frozen fruits and vegetables in resealable bags.  If you do not have access to quality fresh seafood, buy precooked frozen shrimp or canned fish, such as tuna, salmon, or sardines.  Buy small amounts of raw or cooked vegetables, salads, or olives from  the deli or salad bar at your store.  Stock your pantry so you always have certain foods on hand, such as olive oil, canned tuna, canned tomatoes, rice, pasta, and beans. Cooking  Cook foods with extra-virgin olive oil instead of using butter or other vegetable oils.  Have meat as a side dish, and have vegetables or grains as your main dish. This means having meat in small portions or adding small amounts of meat to foods like pasta or stew.  Use beans or vegetables instead of meat in common dishes like chili or lasagna.  Experiment with different cooking methods. Try roasting or broiling vegetables instead of steaming or sauteing them.  Add frozen vegetables to soups, stews, pasta, or rice.  Add nuts or seeds for added healthy fat at each meal. You can add these to yogurt, salads, or vegetable dishes.  Marinate fish or vegetables using olive oil, lemon juice, garlic, and fresh herbs. Meal planning  Plan to eat 1 vegetarian meal one day each week. Try to work up to 2 vegetarian meals, if possible.  Eat seafood 2 or more times a week.  Have healthy snacks readily available, such as: ? Vegetable sticks with hummus. ? Greek yogurt. ? Fruit and nut trail mix.  Eat balanced meals throughout the week. This includes: ? Fruit: 2-3 servings a day ? Vegetables: 4-5 servings a day ? Low-fat dairy: 2 servings a day ? Fish, poultry, or lean meat: 1 serving a day ? Beans and legumes: 2 or more servings a week ?   Nuts and seeds: 1-2 servings a day ? Whole grains: 6-8 servings a day ? Extra-virgin olive oil: 3-4 servings a day  Limit red meat and sweets to only a few servings a month   What are my food choices?  Mediterranean diet ? Recommended  Grains: Whole-grain pasta. Brown rice. Bulgar wheat. Polenta. Couscous. Whole-wheat bread. Oatmeal. Quinoa.  Vegetables: Artichokes. Beets. Broccoli. Cabbage. Carrots. Eggplant. Green beans. Chard. Kale. Spinach. Onions. Leeks. Peas. Squash.  Tomatoes. Peppers. Radishes.  Fruits: Apples. Apricots. Avocado. Berries. Bananas. Cherries. Dates. Figs. Grapes. Lemons. Melon. Oranges. Peaches. Plums. Pomegranate.  Meats and other protein foods: Beans. Almonds. Sunflower seeds. Pine nuts. Peanuts. Cod. Salmon. Scallops. Shrimp. Tuna. Tilapia. Clams. Oysters. Eggs.  Dairy: Low-fat milk. Cheese. Greek yogurt.  Beverages: Water. Red wine. Herbal tea.  Fats and oils: Extra virgin olive oil. Avocado oil. Grape seed oil.  Sweets and desserts: Greek yogurt with honey. Baked apples. Poached pears. Trail mix.  Seasoning and other foods: Basil. Cilantro. Coriander. Cumin. Mint. Parsley. Sage. Rosemary. Tarragon. Garlic. Oregano. Thyme. Pepper. Balsalmic vinegar. Tahini. Hummus. Tomato sauce. Olives. Mushrooms. ? Limit these  Grains: Prepackaged pasta or rice dishes. Prepackaged cereal with added sugar.  Vegetables: Deep fried potatoes (french fries).  Fruits: Fruit canned in syrup.  Meats and other protein foods: Beef. Pork. Lamb. Poultry with skin. Hot dogs. Bacon.  Dairy: Ice cream. Sour cream. Whole milk.  Beverages: Juice. Sugar-sweetened soft drinks. Beer. Liquor and spirits.  Fats and oils: Butter. Canola oil. Vegetable oil. Beef fat (tallow). Lard.  Sweets and desserts: Cookies. Cakes. Pies. Candy.  Seasoning and other foods: Mayonnaise. Premade sauces and marinades. The items listed may not be a complete list. Talk with your dietitian about what dietary choices are right for you. Summary  The Mediterranean diet includes both food and lifestyle choices.  Eat a variety of fresh fruits and vegetables, beans, nuts, seeds, and whole grains.  Limit the amount of red meat and sweets that you eat.  Talk with your health care provider about whether it is safe for you to drink red wine in moderation. This means 1 glass a day for nonpregnant women and 2 glasses a day for men. A glass of wine equals 5 oz (150 mL). This information  is not intended to replace advice given to you by your health care provider. Make sure you discuss any questions you have with your health care provider. Document Revised: 09/25/2015 Document Reviewed: 09/18/2015 Elsevier Patient Education  2020 [employer]  

## 2020-06-23 NOTE — Assessment & Plan Note (Signed)
-  Controlled. -Continue current medication regimen. Will collect CMP for medication monitoring. -Continue ambulatory BP monitoring.

## 2020-06-23 NOTE — Assessment & Plan Note (Addendum)
-  Improved. -On verapamil and sumatriptan succinate. -Recommend to avoid potential headache triggers such as caffeine, chocolate, dehydration. -Will continue to monitor.

## 2020-06-24 LAB — COMPREHENSIVE METABOLIC PANEL
ALT: 42 IU/L (ref 0–44)
AST: 23 IU/L (ref 0–40)
Albumin/Globulin Ratio: 2.2 (ref 1.2–2.2)
Albumin: 4.6 g/dL (ref 4.0–5.0)
Alkaline Phosphatase: 86 IU/L (ref 44–121)
BUN/Creatinine Ratio: 13 (ref 9–20)
BUN: 9 mg/dL (ref 6–24)
Bilirubin Total: 0.2 mg/dL (ref 0.0–1.2)
CO2: 25 mmol/L (ref 20–29)
Calcium: 9 mg/dL (ref 8.7–10.2)
Chloride: 105 mmol/L (ref 96–106)
Creatinine, Ser: 0.72 mg/dL — ABNORMAL LOW (ref 0.76–1.27)
Globulin, Total: 2.1 g/dL (ref 1.5–4.5)
Glucose: 81 mg/dL (ref 65–99)
Potassium: 3.6 mmol/L (ref 3.5–5.2)
Sodium: 145 mmol/L — ABNORMAL HIGH (ref 134–144)
Total Protein: 6.7 g/dL (ref 6.0–8.5)
eGFR: 111 mL/min/{1.73_m2} (ref >59)

## 2020-06-24 LAB — CBC WITH DIFFERENTIAL/PLATELET
Basophils Absolute: 0 10*3/uL (ref 0.0–0.2)
Basos: 1 %
EOS (ABSOLUTE): 0.2 10*3/uL (ref 0.0–0.4)
Eos: 3 %
Hematocrit: 39.4 % (ref 37.5–51.0)
Hemoglobin: 13.4 g/dL (ref 13.0–17.7)
Immature Grans (Abs): 0 10*3/uL (ref 0.0–0.1)
Immature Granulocytes: 0 %
Lymphocytes Absolute: 1.5 10*3/uL (ref 0.7–3.1)
Lymphs: 28 %
MCH: 29.6 pg (ref 26.6–33.0)
MCHC: 34 g/dL (ref 31.5–35.7)
MCV: 87 fL (ref 79–97)
Monocytes Absolute: 0.5 10*3/uL (ref 0.1–0.9)
Monocytes: 9 %
Neutrophils Absolute: 3.2 10*3/uL (ref 1.4–7.0)
Neutrophils: 59 %
Platelets: 250 10*3/uL (ref 150–450)
RBC: 4.52 x10E6/uL (ref 4.14–5.80)
RDW: 13 % (ref 11.6–15.4)
WBC: 5.4 10*3/uL (ref 3.4–10.8)

## 2020-06-24 LAB — LIPID PANEL
Chol/HDL Ratio: 3.9 ratio (ref 0.0–5.0)
Cholesterol, Total: 177 mg/dL (ref 100–199)
HDL: 45 mg/dL (ref >39)
LDL Chol Calc (NIH): 114 mg/dL — ABNORMAL HIGH (ref 0–99)
Triglycerides: 100 mg/dL (ref 0–149)
VLDL Cholesterol Cal: 18 mg/dL (ref 5–40)

## 2020-06-24 LAB — HEMOGLOBIN A1C
Est. average glucose Bld gHb Est-mCnc: 88 mg/dL
Hgb A1c MFr Bld: 4.7 % — ABNORMAL LOW (ref 4.8–5.6)

## 2020-06-26 ENCOUNTER — Other Ambulatory Visit: Payer: Self-pay | Admitting: Physician Assistant

## 2020-06-26 DIAGNOSIS — I10 Essential (primary) hypertension: Secondary | ICD-10-CM

## 2020-06-26 DIAGNOSIS — G43809 Other migraine, not intractable, without status migrainosus: Secondary | ICD-10-CM

## 2020-07-04 ENCOUNTER — Other Ambulatory Visit: Payer: Self-pay | Admitting: Physician Assistant

## 2020-07-25 ENCOUNTER — Other Ambulatory Visit: Payer: Self-pay | Admitting: Physician Assistant

## 2020-07-25 DIAGNOSIS — G43809 Other migraine, not intractable, without status migrainosus: Secondary | ICD-10-CM

## 2020-08-27 DIAGNOSIS — L820 Inflamed seborrheic keratosis: Secondary | ICD-10-CM | POA: Diagnosis not present

## 2020-08-27 DIAGNOSIS — D225 Melanocytic nevi of trunk: Secondary | ICD-10-CM | POA: Diagnosis not present

## 2020-08-27 DIAGNOSIS — Z1283 Encounter for screening for malignant neoplasm of skin: Secondary | ICD-10-CM | POA: Diagnosis not present

## 2020-09-27 ENCOUNTER — Other Ambulatory Visit: Payer: Self-pay | Admitting: Physician Assistant

## 2020-10-11 ENCOUNTER — Other Ambulatory Visit: Payer: Self-pay | Admitting: Physician Assistant

## 2020-10-11 DIAGNOSIS — I10 Essential (primary) hypertension: Secondary | ICD-10-CM

## 2020-10-28 ENCOUNTER — Other Ambulatory Visit: Payer: Self-pay | Admitting: Physician Assistant

## 2020-10-28 ENCOUNTER — Other Ambulatory Visit: Payer: Self-pay

## 2020-10-28 DIAGNOSIS — G43809 Other migraine, not intractable, without status migrainosus: Secondary | ICD-10-CM

## 2020-10-28 DIAGNOSIS — L649 Androgenic alopecia, unspecified: Secondary | ICD-10-CM

## 2020-10-28 MED ORDER — FINASTERIDE 5 MG PO TABS: ORAL_TABLET | ORAL | 2 refills | Status: DC

## 2020-12-22 ENCOUNTER — Other Ambulatory Visit: Payer: Self-pay | Admitting: Physician Assistant

## 2020-12-22 DIAGNOSIS — I10 Essential (primary) hypertension: Secondary | ICD-10-CM

## 2021-01-05 ENCOUNTER — Telehealth: Payer: Self-pay | Admitting: Physician Assistant

## 2021-01-05 NOTE — Telephone Encounter (Signed)
This patient has his physical on 01/23/21 and wants to know if you are going to be doing labs that day or does he need to come in a week prior to this appointment for the fasting labs? [phone]

## 2021-01-05 NOTE — Telephone Encounter (Signed)
The patient can come in fasted and we can do the labs day of physical or he can come in early to get labs drawn. He will have to come in fasted and have an appointment.

## 2021-01-05 NOTE — Telephone Encounter (Signed)
Patient is notified and wants to come in a week prior to physical-appointment has been made for the labs.

## 2021-01-14 ENCOUNTER — Other Ambulatory Visit: Payer: Self-pay | Admitting: Physician Assistant

## 2021-01-14 DIAGNOSIS — E559 Vitamin D deficiency, unspecified: Secondary | ICD-10-CM

## 2021-01-14 DIAGNOSIS — I10 Essential (primary) hypertension: Secondary | ICD-10-CM

## 2021-01-14 DIAGNOSIS — Z0000 Encounter for general adult medical examination without abnormal findings: Secondary | ICD-10-CM

## 2021-01-14 DIAGNOSIS — E7800 Pure hypercholesterolemia, unspecified: Secondary | ICD-10-CM

## 2021-01-16 ENCOUNTER — Other Ambulatory Visit: Payer: BC Managed Care – PPO

## 2021-01-19 ENCOUNTER — Other Ambulatory Visit: Payer: Self-pay

## 2021-01-19 ENCOUNTER — Other Ambulatory Visit: Payer: BC Managed Care – PPO

## 2021-01-19 DIAGNOSIS — E559 Vitamin D deficiency, unspecified: Secondary | ICD-10-CM

## 2021-01-19 DIAGNOSIS — E7800 Pure hypercholesterolemia, unspecified: Secondary | ICD-10-CM | POA: Diagnosis not present

## 2021-01-19 DIAGNOSIS — Z0000 Encounter for general adult medical examination without abnormal findings: Secondary | ICD-10-CM | POA: Diagnosis not present

## 2021-01-19 DIAGNOSIS — I10 Essential (primary) hypertension: Secondary | ICD-10-CM | POA: Diagnosis not present

## 2021-01-20 LAB — COMPREHENSIVE METABOLIC PANEL
ALT: 34 IU/L (ref 0–44)
AST: 16 IU/L (ref 0–40)
Albumin/Globulin Ratio: 2 (ref 1.2–2.2)
Albumin: 4.7 g/dL (ref 4.0–5.0)
Alkaline Phosphatase: 94 IU/L (ref 44–121)
BUN/Creatinine Ratio: 15 (ref 9–20)
BUN: 13 mg/dL (ref 6–24)
Bilirubin Total: 0.3 mg/dL (ref 0.0–1.2)
CO2: 26 mmol/L (ref 20–29)
Calcium: 9.7 mg/dL (ref 8.7–10.2)
Chloride: 102 mmol/L (ref 96–106)
Creatinine, Ser: 0.88 mg/dL (ref 0.76–1.27)
Globulin, Total: 2.3 g/dL (ref 1.5–4.5)
Glucose: 93 mg/dL (ref 70–99)
Potassium: 4.1 mmol/L (ref 3.5–5.2)
Sodium: 143 mmol/L (ref 134–144)
Total Protein: 7 g/dL (ref 6.0–8.5)
eGFR: 105 mL/min/{1.73_m2} (ref >59)

## 2021-01-20 LAB — CBC
Hematocrit: 39.1 % (ref 37.5–51.0)
Hemoglobin: 13.6 g/dL (ref 13.0–17.7)
MCH: 30.4 pg (ref 26.6–33.0)
MCHC: 34.8 g/dL (ref 31.5–35.7)
MCV: 87 fL (ref 79–97)
Platelets: 261 10*3/uL (ref 150–450)
RBC: 4.48 x10E6/uL (ref 4.14–5.80)
RDW: 13 % (ref 11.6–15.4)
WBC: 4.5 10*3/uL (ref 3.4–10.8)

## 2021-01-20 LAB — HEMOGLOBIN A1C
Est. average glucose Bld gHb Est-mCnc: 103 mg/dL
Hgb A1c MFr Bld: 5.2 % (ref 4.8–5.6)

## 2021-01-20 LAB — LIPID PANEL
Chol/HDL Ratio: 4.9 ratio (ref 0.0–5.0)
Cholesterol, Total: 204 mg/dL — ABNORMAL HIGH (ref 100–199)
HDL: 42 mg/dL (ref >39)
LDL Chol Calc (NIH): 141 mg/dL — ABNORMAL HIGH (ref 0–99)
Triglycerides: 114 mg/dL (ref 0–149)
VLDL Cholesterol Cal: 21 mg/dL (ref 5–40)

## 2021-01-20 LAB — TSH: TSH: 2.77 u[IU]/mL (ref 0.450–4.500)

## 2021-01-20 LAB — VITAMIN D 25 HYDROXY (VIT D DEFICIENCY, FRACTURES): Vit D, 25-Hydroxy: 61.1 ng/mL (ref 30.0–100.0)

## 2021-01-23 ENCOUNTER — Encounter: Payer: BC Managed Care – PPO | Admitting: Physician Assistant

## 2021-01-29 ENCOUNTER — Encounter: Payer: Self-pay | Admitting: Physician Assistant

## 2021-01-29 ENCOUNTER — Other Ambulatory Visit: Payer: Self-pay

## 2021-01-29 ENCOUNTER — Ambulatory Visit (INDEPENDENT_AMBULATORY_CARE_PROVIDER_SITE_OTHER): Payer: BC Managed Care – PPO | Admitting: Physician Assistant

## 2021-01-29 VITALS — BP 115/78 | HR 73 | Temp 97.8°F | Ht 71.0 in | Wt 212.0 lb

## 2021-01-29 DIAGNOSIS — Z0000 Encounter for general adult medical examination without abnormal findings: Secondary | ICD-10-CM

## 2021-01-29 DIAGNOSIS — G43809 Other migraine, not intractable, without status migrainosus: Secondary | ICD-10-CM

## 2021-01-29 MED ORDER — SUMATRIPTAN SUCCINATE 100 MG PO TABS: ORAL_TABLET | ORAL | 1 refills | Status: DC

## 2021-01-29 NOTE — Progress Notes (Signed)
Male physical   Impression and Recommendations:    1. Healthcare maintenance   2. Other migraine without status migrainosus, not intractable      1) Anticipatory Guidance: Skin CA prevention-recommend to use sunscreen when outside along with skin surveillance; eat a balanced and modest diet; physical activity at least 25 minutes per day or minimum of 150 min/ week moderate to intense activity.  2) Immunizations / Screenings / Labs:   All immunizations are up-to-date per recommendations or will be updated today if pt allows.    - Patient understands with dental and vision screens they will schedule independently.  - Obtained CBC, CMP, HgA1c, Lipid panel, TSH and vit D.  Most labs are essentially within normal limits with the exception of lipid panel, patient reports not being as diligent with a low-fat diet.  3) Weight: Recommend to continue to improve diet habits to improve overall feelings of well being and objective health data. Improve nutrient density of diet through increasing intake of fruits and vegetables and decreasing saturated fats, white flour products and refined sugars.   4) Healthcare maintenance: -Continue current medication regimen. -BP at goal. -Recommend to increase water intake. -Follow-up in 6 months for HTN, migraine, HLD and fasting blood work (lipid panel, CMP, CBC) few days prior.  No orders of the defined types were placed in this encounter.   Meds ordered this encounter  Medications   SUMAtriptan (IMITREX) 100 MG tablet    Sig: TAKE 1 TABLET BY MOUTH AS  NEEDED FOR MIGRAINE. MAY  REPEAT IN 2 HOURS IF  HEADACHE PERSISTS OR RECURS    Dispense:  10 tablet    Refill:  1    Order Specific Question:   Supervising Provider    Answer:   Beatrice Lecher D [[id number]]     Return in about 6 months (around 07/30/2021) for HTN, migraine, HLD and FBW (lipid panel, cmp, cbc) few days prior .     Gross side effects, risk and benefits, and alternatives of  medications discussed with patient.  Patient is aware that all medications have potential side effects and we are unable to predict every side effect or drug-drug interaction that may occur.  Expresses verbal understanding and consents to current therapy plan and treatment regimen.  Please see AVS handed out to patient at the end of our visit for further patient instructions/ counseling done pertaining to today's office visit.       Subjective:        CC: CPE   HPI: Paul Schroeder is a 50 y.o. male who presents to [facility] today for a yearly health maintenance exam.     Health Maintenance Summary  - Reviewed and updated, unless pt declines services.  Last Cologuard or Colonoscopy:   05/26/2020, repeat in 7 years Family history of Colon CA:  no Tobacco History Reviewed:   yes, former smoker Abdominal Ultrasound:  n/a CT scan for screening lung CA:   n/a Alcohol / drug use:    No concerns, no excessive use / no use ( no more use of marijuana)  Dental Home:  y Eye exams: y Dermatology home: y  Male history: STD concerns:   none Additional penile/ urinary concerns:  none   Additional concerns beyond Health Maintenance issues:  none    Immunization History  Administered Date(s) Administered   Influenza,inj,Quad PF,6+ Mos 12/24/2018   Influenza-Unspecified 11/08/2019, 10/30/2020   PFIZER(Purple Top)SARS-COV-2 Vaccination 04/21/2019, 05/15/2019, 11/29/2019,  05/12/2020   Pfizer Covid-19 Office manager 22yrs & up 10/22/2020   Tdap 05/25/2011   Zoster Recombinat (Shingrix) 10/22/2020, 01/12/2021     Health Maintenance  Topic Date Due   Pneumococcal Vaccine 50-75 Years old (1 - PCV) Never done   Hepatitis C Screening  Never done   TETANUS/TDAP  05/24/2021   COLONOSCOPY (Pts 45-63yrs Insurance coverage will need to be confirmed)  05/27/2027   INFLUENZA VACCINE  Completed   COVID-19 Vaccine  Completed   HIV Screening   Completed   Zoster Vaccines- Shingrix  Completed   HPV VACCINES  Aged Out       Wt Readings from Last 3 Encounters:  01/29/21 212 lb (96.2 kg)  06/23/20 210 lb 9.6 oz (95.5 kg)  05/26/20 195 lb (88.5 kg)   BP Readings from Last 3 Encounters:  01/29/21 115/78  06/23/20 112/72  05/26/20 129/90   Pulse Readings from Last 3 Encounters:  01/29/21 73  06/23/20 62  05/26/20 65    Patient Active Problem List   Diagnosis Date Noted   Rhinosinusitis 11/28/2018   Elevated LDL cholesterol level 09/28/2018   Current non-drinker of alcohol 09/28/2018    (BMI 25.0-29.9) 03/02/2018   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   Insomnia 01/13/2018   Medication monitoring encounter 07/13/2017   Family history of hyperlipidemia 07/13/2017   Health education/counseling 07/13/2017   Erectile dysfunction 02/25/2017   Vitamin D insufficiency 02/25/2017   Anxious mood 09/10/2016   Gastroesophageal reflux disease 09/10/2016   HTN, goal below 130/80 09/10/2016   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   HTN (hypertension) 08/12/2016   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 )  08/12/2016   Environmental and seasonal allergies 08/12/2016   Seasonal allergic rhinitis 08/12/2016   H/O non anemic vitamin B12 deficiency 08/12/2016   H/O iron deficiency anemia 08/12/2016   Tobacco use disorder- approximately 28-pack-year history- NOW quit even vaping! 08/12/2016   Encounter for counseling for tobacco use disorder 08/12/2016   History of panic attacks 08/12/2016   Family history of mood disorder- MOM 08/12/2016   Adjustment disorder with mixed anxiety and depressed mood- since teenager 08/12/2016   Melanoma of skin ([place]) 08/12/2016   Dysuria 10/07/2015   Pelvic floor dysfunction- treated by urology 04/22/2015   Right groin pain 03/05/2015   Hematospermia 02/22/2015   Epididymitis, right 02/22/2015    Past Medical History:  Diagnosis Date   Allergy    Anxiety    Bronchitis    Cancer ([place]) 2013    Malignant mole on his back removed    Hematospermia    Hemorrhoids    Migraines    family Hx, triggered by stress   Seasonal allergies     Past Surgical History:  Procedure Laterality Date   COLONOSCOPY WITH PROPOFOL N/A 05/26/2020   Procedure: COLONOSCOPY WITH PROPOFOL;  Surgeon: Jonathon Bellows, MD;  Location: [facility] ENDOSCOPY;  Service: Gastroenterology;  Laterality: N/A;   skin lesions removed      Family History  Problem Relation Age of Onset   Hypertension Mother    Hyperlipidemia Mother    Stroke Mother    Depression Mother    Kidney disease Brother    Kidney disease Maternal Grandfather    Heart disease Maternal Grandfather    Prostate cancer Neg Hx     Social History   Substance and Sexual Activity  Drug Use Yes   Frequency: 2.0 times per week   Comment: marijuana  ,  Social History  Substance and Sexual Activity  Alcohol Use Yes   Alcohol/week: 4.0 standard drinks   Types: 4 Shots of liquor per week  ,  Social History   Tobacco Use  Smoking Status Former   Packs/day: 1.00   Years: 24.00   Pack years: 24.00   Types: Cigarettes   Start date: 7   Quit date: 2017   Years since quitting: 5.9  Smokeless Tobacco Never  ,  Social History   Substance and Sexual Activity  Sexual Activity Not on file    Patient's Medications  New Prescriptions   No medications on file  Previous Medications   AMLODIPINE (NORVASC) 5 MG TABLET    TAKE 1 TABLET BY MOUTH  DAILY   BIOTIN 5000 PO    Take by mouth.   FINASTERIDE (PROSCAR) 5 MG TABLET    Take 1/4 tablet by mouth daily   HYDROCHLOROTHIAZIDE (HYDRODIURIL) 12.5 MG TABLET    TAKE 1 TABLET BY MOUTH  DAILY   MULTIPLE VITAMIN (ONE-A-DAY ESSENTIAL PO)    Take by mouth.   PROBIOTIC PRODUCT (FORTIFY DAILY PROBIOTIC PO)    Take by mouth.   TRIAMCINOLONE (NASACORT) 55 MCG/ACT AERO NASAL INHALER    Place 2 sprays into the nose at bedtime. Into each nostril   VALSARTAN (DIOVAN) 160 MG TABLET    TAKE 1 TABLET BY MOUTH   DAILY   VERAPAMIL (CALAN-SR) 120 MG CR TABLET    TAKE 1 TABLET BY MOUTH  TWICE DAILY  Modified Medications   Modified Medication Previous Medication   SUMATRIPTAN (IMITREX) 100 MG TABLET SUMAtriptan (IMITREX) 100 MG tablet      TAKE 1 TABLET BY MOUTH AS  NEEDED FOR MIGRAINE. MAY  REPEAT IN 2 HOURS IF  HEADACHE PERSISTS OR RECURS    TAKE 1 TABLET BY MOUTH AS  NEEDED FOR MIGRAINE. MAY  REPEAT IN 2 HOURS IF  HEADACHE PERSISTS OR RECURS  Discontinued Medications   No medications on file    Patient has no known allergies.  Review of Systems: General:   Denies fever, chills, unexplained weight loss.  Optho/Auditory:   Denies visual changes, blurred vision/LOV Respiratory:   Denies SOB, DOE more than baseline levels.   Cardiovascular:   Denies chest pain, palpitations, new onset peripheral edema  Gastrointestinal:   Denies nausea, vomiting, diarrhea.  Genitourinary: Denies dysuria, freq/ urgency, flank pain  Endocrine:     Denies hot or cold intolerance, polyuria, polydipsia. Musculoskeletal:   Denies unexplained myalgias, joint swelling, unexplained arthralgias, gait problems.  Skin:  Denies rash, suspicious lesions Neurological:     Denies dizziness, unexplained weakness, numbness  Psychiatric/Behavioral:   Denies mood changes, suicidal or homicidal ideations, hallucinations    Objective:     Blood pressure 115/78, pulse 73, temperature 97.8 F (36.6 C), height 5\' 11"  (1.803 m), weight 212 lb (96.2 kg), SpO2 99 %. Body mass index is 29.57 kg/m. General Appearance:    Alert, cooperative, no distress, appears stated age  Head:    Normocephalic, without obvious abnormality, atraumatic  Eyes:    PERRL, conjunctiva/corneas clear, EOM's intact, fundi    benign, both eyes  Ears:    Normal TM's and external ear canals, both ears  Nose:   Nares normal, septum midline, mucosa normal, no drainage    or sinus tenderness  Throat:   Lips w/o lesion, mucosa moist, and tongue normal; teeth and  gums normal  Neck:   Supple, symmetrical, trachea midline, no adenopathy;    thyroid:  no  enlargement/tenderness/nodules; no JVD  Back:     Symmetric, no curvature, ROM normal, no CVA tenderness  Lungs:     Clear to auscultation bilaterally, respirations unlabored, no Wh/ R/ R  Chest Wall:    No tenderness or gross deformity; normal excursion   Heart:    Regular rate and rhythm, S1 and S2 normal, no murmur  Abdomen:     Soft, non-tender, bowel sounds active all four quadrants, No G/R/R, no masses, no organomegaly  Genitalia:    Deferred.  Rectal:    Deferred.  Extremities:   Extremities normal, atraumatic, no cyanosis or gross edema  Pulses:   2+ and symmetric all extremities  Skin:   Warm, dry, Skin color, texture, turgor normal, no obvious rashes or lesions, scattered SK lesions  M-Sk:   Ambulates * 4 w/o difficulty, no gross deformities, tone WNL  Neurologic:   CNII-XII grossly intact Psych:  No HI/SI, judgement and insight good, Euthymic mood. Full Affect.

## 2021-01-29 NOTE — Patient Instructions (Signed)
Preventive Care 50 Years Old, Male °Preventive care refers to lifestyle choices and visits with your health care provider that can promote health and wellness. Preventive care visits are also called wellness exams. °What can I expect for my preventive care visit? °Counseling °During your preventive care visit, your health care provider may ask about your: °Medical history, including: °Past medical problems. °Family medical history. °Current health, including: °Emotional well-being. °Home life and relationship well-being. °Sexual activity. °Lifestyle, including: °Alcohol, nicotine or tobacco, and drug use. °Access to firearms. °Diet, exercise, and sleep habits. °Safety issues such as seatbelt and bike helmet use. °Sunscreen use. °Work and work environment. °Physical exam °Your health care provider will check your: °Height and weight. These may be used to calculate your BMI (body mass index). BMI is a measurement that tells if you are at a healthy weight. °Waist circumference. This measures the distance around your waistline. This measurement also tells if you are at a healthy weight and may help predict your risk of certain diseases, such as type 2 diabetes and high blood pressure. °Heart rate and blood pressure. °Body temperature. °Skin for abnormal spots. °What immunizations do I need? °Vaccines are usually given at various ages, according to a schedule. Your health care provider will recommend vaccines for you based on your age, medical history, and lifestyle or other factors, such as travel or where you work. °What tests do I need? °Screening °Your health care provider may recommend screening tests for certain conditions. This may include: °Lipid and cholesterol levels. °Diabetes screening. This is done by checking your blood sugar (glucose) after you have not eaten for a while (fasting). °Hepatitis B test. °Hepatitis C test. °HIV (human immunodeficiency virus) test. °STI (sexually transmitted infection)  testing, if you are at risk. °Lung cancer screening. °Prostate cancer screening. °Colorectal cancer screening. °Talk with your health care provider about your test results, treatment options, and if necessary, the need for more tests. °Follow these instructions at home: °Eating and drinking ° °Eat a diet that includes fresh fruits and vegetables, whole grains, lean protein, and low-fat dairy products. °Take vitamin and mineral supplements as recommended by your health care provider. °Do not drink alcohol if your health care provider tells you not to drink. °If you drink alcohol: °Limit how much you have to 0-2 drinks a day. °Know how much alcohol is in your drink. In the [place] one drink equals one 12 oz bottle of beer (355 mL), one 5 oz glass of wine (148 mL), or one 1½ oz glass of hard liquor (44 mL). °Lifestyle °Brush your teeth every morning and night with fluoride toothpaste. Floss one time each day. °Exercise for at least 30 minutes 5 or more days each week. °Do not use any products that contain nicotine or tobacco. These products include cigarettes, chewing tobacco, and vaping devices, such as e-cigarettes. If you need help quitting, ask your health care provider. °Do not use drugs. °If you are sexually active, practice safe sex. Use a condom or other form of protection to prevent STIs. °Take aspirin only as told by your health care provider. Make sure that you understand how much to take and what form to take. Work with your health care provider to find out whether it is safe and beneficial for you to take aspirin daily. °Find healthy ways to manage stress, such as: °Meditation, yoga, or listening to music. °Journaling. °Talking to a trusted person. °Spending time with friends and family. °Minimize exposure to UV radiation to reduce your   risk of skin cancer. °Safety °Always wear your seat belt while driving or riding in a vehicle. °Do not drive: °If you have been drinking alcohol. Do not ride with someone who  has been drinking. °When you are tired or distracted. °While texting. °If you have been using any mind-altering substances or drugs. °Wear a helmet and other protective equipment during sports activities. °If you have firearms in your house, make sure you follow all gun safety procedures. °What's next? °Go to your health care provider once a year for an annual wellness visit. °Ask your health care provider how often you should have your eyes and teeth checked. °Stay up to date on all vaccines. °This information is not intended to replace advice given to you by your health care provider. Make sure you discuss any questions you have with your health care provider. °Document Revised: 07/23/2020 Document Reviewed: 07/23/2020 °Elsevier Patient Education © 50 [employer]vier Inc. ° °

## 2021-02-03 DIAGNOSIS — U071 COVID-19: Secondary | ICD-10-CM | POA: Diagnosis not present

## 2021-02-03 DIAGNOSIS — I10 Essential (primary) hypertension: Secondary | ICD-10-CM | POA: Diagnosis not present

## 2021-02-03 DIAGNOSIS — E663 Overweight: Secondary | ICD-10-CM | POA: Diagnosis not present

## 2021-02-08 HISTORY — PX: OTHER SURGICAL HISTORY: SHX169

## 2021-02-10 ENCOUNTER — Encounter: Payer: Self-pay | Admitting: Physician Assistant

## 2021-02-10 DIAGNOSIS — G43809 Other migraine, not intractable, without status migrainosus: Secondary | ICD-10-CM

## 2021-02-10 DIAGNOSIS — I10 Essential (primary) hypertension: Secondary | ICD-10-CM

## 2021-02-11 MED ORDER — VALSARTAN 160 MG PO TABS: 160.0000 mg | ORAL_TABLET | Freq: Every day | ORAL | 1 refills | Status: DC

## 2021-02-11 MED ORDER — VERAPAMIL HCL ER 120 MG PO TBCR: 120.0000 mg | EXTENDED_RELEASE_TABLET | Freq: Two times a day (BID) | ORAL | 1 refills | Status: DC

## 2021-02-11 MED ORDER — SUMATRIPTAN SUCCINATE 100 MG PO TABS: ORAL_TABLET | ORAL | 1 refills | Status: DC

## 2021-02-11 MED ORDER — HYDROCHLOROTHIAZIDE 12.5 MG PO TABS: 12.5000 mg | ORAL_TABLET | Freq: Every day | ORAL | 1 refills | Status: DC

## 2021-02-11 MED ORDER — AMLODIPINE BESYLATE 5 MG PO TABS: 5.0000 mg | ORAL_TABLET | Freq: Every day | ORAL | 1 refills | Status: DC

## 2021-03-17 ENCOUNTER — Other Ambulatory Visit: Payer: Self-pay | Admitting: Physician Assistant

## 2021-03-18 ENCOUNTER — Other Ambulatory Visit: Payer: Self-pay | Admitting: Physician Assistant

## 2021-07-23 ENCOUNTER — Other Ambulatory Visit: Payer: BC Managed Care – PPO

## 2021-07-23 DIAGNOSIS — I10 Essential (primary) hypertension: Secondary | ICD-10-CM

## 2021-07-23 DIAGNOSIS — Z0000 Encounter for general adult medical examination without abnormal findings: Secondary | ICD-10-CM | POA: Diagnosis not present

## 2021-07-23 DIAGNOSIS — E7800 Pure hypercholesterolemia, unspecified: Secondary | ICD-10-CM

## 2021-07-24 LAB — CBC
Hematocrit: 38.5 % (ref 37.5–51.0)
Hemoglobin: 13.3 g/dL (ref 13.0–17.7)
MCH: 30.4 pg (ref 26.6–33.0)
MCHC: 34.5 g/dL (ref 31.5–35.7)
MCV: 88 fL (ref 79–97)
Platelets: 227 10*3/uL (ref 150–450)
RBC: 4.38 x10E6/uL (ref 4.14–5.80)
RDW: 12.7 % (ref 11.6–15.4)
WBC: 4.9 10*3/uL (ref 3.4–10.8)

## 2021-07-24 LAB — COMPREHENSIVE METABOLIC PANEL
ALT: 28 IU/L (ref 0–44)
AST: 13 IU/L (ref 0–40)
Albumin/Globulin Ratio: 2 (ref 1.2–2.2)
Albumin: 4.5 g/dL (ref 3.8–4.9)
Alkaline Phosphatase: 86 IU/L (ref 44–121)
BUN/Creatinine Ratio: 16 (ref 9–20)
BUN: 12 mg/dL (ref 6–24)
Bilirubin Total: 0.3 mg/dL (ref 0.0–1.2)
CO2: 22 mmol/L (ref 20–29)
Calcium: 8.8 mg/dL (ref 8.7–10.2)
Chloride: 101 mmol/L (ref 96–106)
Creatinine, Ser: 0.77 mg/dL (ref 0.76–1.27)
Globulin, Total: 2.2 g/dL (ref 1.5–4.5)
Glucose: 93 mg/dL (ref 70–99)
Potassium: 3.6 mmol/L (ref 3.5–5.2)
Sodium: 142 mmol/L (ref 134–144)
Total Protein: 6.7 g/dL (ref 6.0–8.5)
eGFR: 108 mL/min/{1.73_m2} (ref >59)

## 2021-07-24 LAB — LIPID PANEL
Chol/HDL Ratio: 3.7 ratio (ref 0.0–5.0)
Cholesterol, Total: 175 mg/dL (ref 100–199)
HDL: 47 mg/dL (ref >39)
LDL Chol Calc (NIH): 113 mg/dL — ABNORMAL HIGH (ref 0–99)
Triglycerides: 80 mg/dL (ref 0–149)
VLDL Cholesterol Cal: 15 mg/dL (ref 5–40)

## 2021-07-30 ENCOUNTER — Ambulatory Visit: Payer: BC Managed Care – PPO | Admitting: Physician Assistant

## 2021-08-06 ENCOUNTER — Encounter: Payer: Self-pay | Admitting: Physician Assistant

## 2021-08-06 ENCOUNTER — Ambulatory Visit (INDEPENDENT_AMBULATORY_CARE_PROVIDER_SITE_OTHER): Payer: BC Managed Care – PPO | Admitting: Physician Assistant

## 2021-08-06 VITALS — BP 114/75 | HR 81 | Temp 97.7°F | Ht 71.0 in | Wt 217.0 lb

## 2021-08-06 DIAGNOSIS — G43809 Other migraine, not intractable, without status migrainosus: Secondary | ICD-10-CM

## 2021-08-06 DIAGNOSIS — E7800 Pure hypercholesterolemia, unspecified: Secondary | ICD-10-CM | POA: Diagnosis not present

## 2021-08-06 DIAGNOSIS — G43101 Migraine with aura, not intractable, with status migrainosus: Secondary | ICD-10-CM | POA: Diagnosis not present

## 2021-08-06 DIAGNOSIS — Z23 Encounter for immunization: Secondary | ICD-10-CM

## 2021-08-06 DIAGNOSIS — R1031 Right lower quadrant pain: Secondary | ICD-10-CM

## 2021-08-06 DIAGNOSIS — R8290 Unspecified abnormal findings in urine: Secondary | ICD-10-CM

## 2021-08-06 DIAGNOSIS — L649 Androgenic alopecia, unspecified: Secondary | ICD-10-CM | POA: Diagnosis not present

## 2021-08-06 DIAGNOSIS — R1909 Other intra-abdominal and pelvic swelling, mass and lump: Secondary | ICD-10-CM

## 2021-08-06 DIAGNOSIS — I10 Essential (primary) hypertension: Secondary | ICD-10-CM | POA: Diagnosis not present

## 2021-08-06 MED ORDER — VERAPAMIL HCL ER 120 MG PO TBCR: 120.0000 mg | EXTENDED_RELEASE_TABLET | Freq: Two times a day (BID) | ORAL | 1 refills | Status: DC

## 2021-08-06 MED ORDER — VALSARTAN 160 MG PO TABS: 160.0000 mg | ORAL_TABLET | Freq: Every day | ORAL | 1 refills | Status: DC

## 2021-08-06 MED ORDER — HYDROCHLOROTHIAZIDE 12.5 MG PO TABS: 12.5000 mg | ORAL_TABLET | Freq: Every day | ORAL | 1 refills | Status: DC

## 2021-08-06 MED ORDER — FINASTERIDE 5 MG PO TABS: ORAL_TABLET | ORAL | 2 refills | Status: DC

## 2021-08-06 MED ORDER — AMLODIPINE BESYLATE 5 MG PO TABS: 5.0000 mg | ORAL_TABLET | Freq: Every day | ORAL | 1 refills | Status: DC

## 2021-08-06 MED ORDER — SUMATRIPTAN SUCCINATE 100 MG PO TABS: ORAL_TABLET | ORAL | 1 refills | Status: DC

## 2021-08-06 NOTE — Assessment & Plan Note (Signed)
-  Stable. Will continue current medication regimen. Will continue to monitor.

## 2021-08-06 NOTE — Assessment & Plan Note (Signed)
-  BP in office controlled. Continue current medication regimen. Will continue to monitor. Discussed with patient recent CMP, normal.

## 2021-08-06 NOTE — Progress Notes (Signed)
Established patient visit   Patient: Paul Schroeder   DOB: August 14, 1970   51 y.o. Male  MRN: [id number] Visit Date: 08/06/2021  Chief Complaint  Patient presents with   Follow-up   Subjective    HPI  Patient presents for chronic follow-up. Patient reports urine has been looking more dark and cloudy. In the past has seen urology for similar symptoms. States noticed a lump right groin area about a month ago which sometimes throbs. Size has been the same. No redness or swelling. Reports no strenuous activity or heavy lifting on a daily basis. Continues with finasteride. No problems with medication or hair.  Migraine: Patient reports has had 4 headaches since last visit which improved after taking Imitrex, overall headaches are stable since being on Verapamil.   HTN: Pt denies chest pain, palpitations, dizziness or lower extremity swelling. Taking medication as directed without side effects. Checks BP at home few times/wk and readings range 130/80-90.   HLD: Pt trying to manage with diet. States has worked on diet changes by reducing carbohydrates and sugar.    Medications: Outpatient Medications Prior to Visit  Medication Sig   BIOTIN 5000 PO Take by mouth.   Multiple Vitamin (ONE-A-DAY ESSENTIAL PO) Take by mouth.   Probiotic Product (FORTIFY DAILY PROBIOTIC PO) Take by mouth.   triamcinolone (NASACORT) 55 MCG/ACT AERO nasal inhaler Place 2 sprays into the nose at bedtime. Into each nostril   [DISCONTINUED] amLODipine (NORVASC) 5 MG tablet Take 1 tablet (5 mg total) by mouth daily.   [DISCONTINUED] finasteride (PROSCAR) 5 MG tablet Take 1/4 tablet by mouth daily   [DISCONTINUED] hydrochlorothiazide (HYDRODIURIL) 12.5 MG tablet Take 1 tablet (12.5 mg total) by mouth daily.   [DISCONTINUED] SUMAtriptan (IMITREX) 100 MG tablet TAKE 1 TABLET BY MOUTH AS  NEEDED FOR MIGRAINE. MAY  REPEAT IN 2 HOURS IF  HEADACHE PERSISTS OR RECURS   [DISCONTINUED] valsartan (DIOVAN) 160 MG tablet Take 1  tablet (160 mg total) by mouth daily.   [DISCONTINUED] verapamil (CALAN-SR) 120 MG CR tablet Take 1 tablet (120 mg total) by mouth 2 (two) times daily.   No facility-administered medications prior to visit.    Review of Systems Review of Systems:  A fourteen system review of systems was performed and found to be positive as per HPI.  Last CBC Lab Results  Component Value Date   WBC 4.9 07/23/2021   HGB 13.3 07/23/2021   HCT 38.5 07/23/2021   MCV 88 07/23/2021   MCH 30.4 07/23/2021   RDW 12.7 07/23/2021   PLT 227 82/50/5397   Last metabolic panel Lab Results  Component Value Date   GLUCOSE 93 07/23/2021   NA 142 07/23/2021   K 3.6 07/23/2021   CL 101 07/23/2021   CO2 22 07/23/2021   BUN 12 07/23/2021   CREATININE 0.77 07/23/2021   EGFR 108 07/23/2021   CALCIUM 8.8 07/23/2021   PHOS 2.4 (L) 08/19/2017   PROT 6.7 07/23/2021   ALBUMIN 4.5 07/23/2021   LABGLOB 2.2 07/23/2021   AGRATIO 2.0 07/23/2021   BILITOT 0.3 07/23/2021   ALKPHOS 86 07/23/2021   AST 13 07/23/2021   ALT 28 07/23/2021   Last lipids Lab Results  Component Value Date   CHOL 175 07/23/2021   HDL 47 07/23/2021   LDLCALC 113 (H) 07/23/2021   TRIG 80 07/23/2021   CHOLHDL 3.7 07/23/2021   Last hemoglobin A1c Lab Results  Component Value Date   HGBA1C 5.2 01/19/2021   Last thyroid functions Lab Results  Component Value Date   TSH 2.770 01/19/2021   T3TOTAL 111 03/02/2018   Last vitamin D Lab Results  Component Value Date   VD25OH 61.1 01/19/2021     Objective    BP 114/75   Pulse 81   Temp 97.7 F (36.5 C)   Ht 5' 11"  (1.803 m)   Wt 217 lb (98.4 kg)   SpO2 99%   BMI 30.27 kg/m  BP Readings from Last 3 Encounters:  08/06/21 114/75  01/29/21 115/78  06/23/20 112/72   Wt Readings from Last 3 Encounters:  08/06/21 217 lb (98.4 kg)  01/29/21 212 lb (96.2 kg)  06/23/20 210 lb 9.6 oz (95.5 kg)    Physical Exam  General:  Pleasant and cooperative, appropriate for stated age.   Neuro:  Alert and oriented,  extra-ocular muscles intact  HEENT:  Normocephalic, atraumatic, neck supple  Skin:  no gross rash, warm, pink. Cardiac:  RRR, S1 S2 Respiratory: CTA B/L  GU: small mobile mass at right groin area which is tender to palpation Vascular:  Ext warm, no cyanosis apprec.; cap RF less 2 sec. Psych:  No HI/SI, judgement and insight good, Euthymic mood. Full Affect.   No results found for any visits on 08/06/21.  Assessment & Plan      Problem List Items Addressed This Visit       Cardiovascular and Mediastinum   HTN (hypertension) - Primary (Chronic)    -BP in office controlled. Continue current medication regimen. Will continue to monitor. Discussed with patient recent CMP, normal.      Relevant Medications   amLODipine (NORVASC) 5 MG tablet   hydrochlorothiazide (HYDRODIURIL) 12.5 MG tablet   valsartan (DIOVAN) 160 MG tablet   verapamil (CALAN-SR) 120 MG CR tablet   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 )  (Chronic)    -Stable. Will continue current medication regimen. Will continue to monitor.      Relevant Medications   amLODipine (NORVASC) 5 MG tablet   hydrochlorothiazide (HYDRODIURIL) 12.5 MG tablet   valsartan (DIOVAN) 160 MG tablet   verapamil (CALAN-SR) 120 MG CR tablet   SUMAtriptan (IMITREX) 100 MG tablet     Endocrine   Male pattern baldness (Chronic)    -Stable. Continue current medication regimen. Will continue to monitor.      Relevant Medications   finasteride (PROSCAR) 5 MG tablet     Other   Right groin pain   Relevant Orders   US PELVIS LIMITED (TRANSABDOMINAL ONLY)   Elevated LDL cholesterol level    -Discussed with patient recent lipid panel which has improved from prior. LDL remains mildly elevated. Recommend to continue with diet changes and encourage to start routine exercise regimen.  -The 10-year ASCVD risk score (Arnett DK, et al., 2019) is: 3.2%. No family hx of early CAD/heart disease. -Will repeat  lipid panel with annual physical.       Other Visit Diagnoses     Right groin mass       Relevant Orders   US PELVIS LIMITED (TRANSABDOMINAL ONLY)   Cloudy urine       Relevant Orders   Urine Culture   Need for Tdap vaccination       Relevant Orders   Tdap vaccine greater than or equal to 7yo IM (Completed)      Cloudy urine: -Will collect urine culture to r/o UTI. Patient previously established with [place] for pelvic floor dysfunction. Discussed with patient if urine culture negative,  recommend to follow-up with urology.  Right groin mass: -Will place order for ultrasound for further evaluation. Suspect inguinal hernia or enlarged lymph node. Discussed with patient recent CBC which is normal including WBC.   Return in about 6 months (around 02/05/2022) for CPE and FBW.        Lorrene Reid, PA-C  [facility] at [facility] [phone] (phone) [phone] (fax)  [employer]

## 2021-08-06 NOTE — Assessment & Plan Note (Signed)
-  Stable. -Continue current medication regimen.  -Will continue to monitor. 

## 2021-08-06 NOTE — Patient Instructions (Signed)
Depression and Anxiety You will learn about mood disorders, how these conditions can occur in people with heart disease, and why it is important to treat these conditions. To view the content, go to this web address: [email or web address]  This video will expire on: 04/28/2023. If you need access to this video following this date, please reach out to the healthcare provider who assigned it to you. This information is not intended to replace advice given to you by your health care provider. Make sure you discuss any questions you have with your health care provider. Elsevier Patient Education  [place].

## 2021-08-06 NOTE — Assessment & Plan Note (Signed)
-  Discussed with patient recent lipid panel which has improved from prior. LDL remains mildly elevated. Recommend to continue with diet changes and encourage to start routine exercise regimen.  -The 10-year ASCVD risk score (Arnett DK, et al., 2019) is: 3.2%. No family hx of early CAD/heart disease. -Will repeat lipid panel with annual physical.

## 2021-08-08 LAB — URINE CULTURE: Organism ID, Bacteria: NO GROWTH

## 2021-08-13 ENCOUNTER — Ambulatory Visit
Admission: RE | Admit: 2021-08-13 | Discharge: 2021-08-13 | Disposition: A | Discharge status: Home / Self Care | Payer: BC Managed Care – PPO | Source: Ambulatory Visit | Attending: Physician Assistant | Admitting: Physician Assistant

## 2021-08-13 DIAGNOSIS — R1909 Other intra-abdominal and pelvic swelling, mass and lump: Secondary | ICD-10-CM | POA: Diagnosis not present

## 2021-08-13 DIAGNOSIS — R1031 Right lower quadrant pain: Secondary | ICD-10-CM

## 2021-08-17 ENCOUNTER — Other Ambulatory Visit: Payer: Self-pay | Admitting: Physician Assistant

## 2021-08-17 DIAGNOSIS — R1909 Other intra-abdominal and pelvic swelling, mass and lump: Secondary | ICD-10-CM

## 2021-08-17 DIAGNOSIS — R1031 Right lower quadrant pain: Secondary | ICD-10-CM

## 2021-08-25 ENCOUNTER — Ambulatory Visit
Admission: RE | Admit: 2021-08-25 | Discharge: 2021-08-25 | Disposition: A | Discharge status: Home / Self Care | Payer: BC Managed Care – PPO | Source: Ambulatory Visit | Attending: Physician Assistant | Admitting: Physician Assistant

## 2021-08-25 DIAGNOSIS — R160 Hepatomegaly, not elsewhere classified: Secondary | ICD-10-CM | POA: Diagnosis not present

## 2021-08-25 DIAGNOSIS — R1031 Right lower quadrant pain: Secondary | ICD-10-CM

## 2021-08-25 DIAGNOSIS — R1909 Other intra-abdominal and pelvic swelling, mass and lump: Secondary | ICD-10-CM

## 2021-08-25 DIAGNOSIS — K429 Umbilical hernia without obstruction or gangrene: Secondary | ICD-10-CM | POA: Diagnosis not present

## 2021-08-25 MED ORDER — IOPAMIDOL (ISOVUE-300) INJECTION 61%
100.0000 mL | Freq: Once | INTRAVENOUS | Status: AC | PRN
Start: 2021-08-25 — End: 2021-08-25
  Administered 2021-08-25: 100 mL via INTRAVENOUS

## 2021-08-26 ENCOUNTER — Other Ambulatory Visit: Payer: Self-pay | Admitting: Physician Assistant

## 2021-08-26 DIAGNOSIS — R160 Hepatomegaly, not elsewhere classified: Secondary | ICD-10-CM

## 2021-08-26 DIAGNOSIS — D485 Neoplasm of uncertain behavior of skin: Secondary | ICD-10-CM | POA: Diagnosis not present

## 2021-08-26 DIAGNOSIS — Z1283 Encounter for screening for malignant neoplasm of skin: Secondary | ICD-10-CM | POA: Diagnosis not present

## 2021-08-26 DIAGNOSIS — L905 Scar conditions and fibrosis of skin: Secondary | ICD-10-CM | POA: Diagnosis not present

## 2021-08-26 DIAGNOSIS — R1909 Other intra-abdominal and pelvic swelling, mass and lump: Secondary | ICD-10-CM

## 2021-08-26 DIAGNOSIS — D225 Melanocytic nevi of trunk: Secondary | ICD-10-CM | POA: Diagnosis not present

## 2021-09-01 ENCOUNTER — Encounter: Payer: Self-pay | Admitting: Physician Assistant

## 2021-09-03 DIAGNOSIS — L729 Follicular cyst of the skin and subcutaneous tissue, unspecified: Secondary | ICD-10-CM | POA: Diagnosis not present

## 2021-09-04 ENCOUNTER — Other Ambulatory Visit: Payer: Self-pay | Admitting: Urology

## 2021-09-14 DIAGNOSIS — D485 Neoplasm of uncertain behavior of skin: Secondary | ICD-10-CM | POA: Diagnosis not present

## 2021-09-14 DIAGNOSIS — L98429 Non-pressure chronic ulcer of back with unspecified severity: Secondary | ICD-10-CM | POA: Diagnosis not present

## 2021-09-23 ENCOUNTER — Ambulatory Visit (INDEPENDENT_AMBULATORY_CARE_PROVIDER_SITE_OTHER): Payer: BC Managed Care – PPO | Admitting: Physician Assistant

## 2021-09-23 ENCOUNTER — Encounter (HOSPITAL_BASED_OUTPATIENT_CLINIC_OR_DEPARTMENT_OTHER): Payer: Self-pay | Admitting: Urology

## 2021-09-23 ENCOUNTER — Encounter: Payer: Self-pay | Admitting: Physician Assistant

## 2021-09-23 VITALS — BP 122/88 | HR 72 | Ht 71.0 in | Wt 212.6 lb

## 2021-09-23 DIAGNOSIS — R932 Abnormal findings on diagnostic imaging of liver and biliary tract: Secondary | ICD-10-CM

## 2021-09-23 NOTE — Progress Notes (Unsigned)
Chief Complaint: Abnormal imaging of the liver  HPI:    Mr. Leite is a 51 year old male with a past medical history as listed below including anxiety, who was referred to me by Lorrene Reid, PA-C for a complaint of abnormal imaging of the liver.      05/26/2020 colonoscopy with Dr. Jennet Maduro with 2 polyps and otherwise normal.  Pathology showed tubular adenoma and repeat recommended in 7 years.    08/25/2021 CT of the abdomen pelvis with contrast done for abdominal pain.  This showed hepatomegaly measuring 19 cm in length with 2 enhancing hepatic masses, one 2.6 cm and the other 2.3 cm which most likely represented hemangiomas.  Recommended follow-up MRI of the abdomen with contrast.    07/23/2021 normal CBC and CMP.    Today, patient presents to clinic and tells me that he was told to follow-up with [place] regarding "liver masses".  Tells me he initially had his CT because of some right groin pain.  They also identified a cyst in his groin which is likely the cause of this and actually he has plans for removal of the cyst in the next couple of weeks.  Denies any other acute GI complaints or concerns.    Denies fever, chills, weight loss, change in bowel habits, heartburn, reflux, family history of liver disease or abdominal pain.  Past Medical History:  Diagnosis Date   Allergy    Anxiety    Bronchitis    Cancer ([facility]) 02/09/2011   Malignant mole on his back removed    Hematospermia    Hemorrhoids    Hypertension    Migraines    family Hx, triggered by stress   Seasonal allergies     Past Surgical History:  Procedure Laterality Date   COLONOSCOPY WITH PROPOFOL N/A 05/26/2020   Procedure: COLONOSCOPY WITH PROPOFOL;  Surgeon: Jonathon Bellows, MD;  Location: [facility] ENDOSCOPY;  Service: Gastroenterology;  Laterality: N/A;   melanoma excision  2023   back   skin lesions removed      Current Outpatient Medications  Medication Sig Dispense Refill   amLODipine (NORVASC) 5 MG tablet Take 1  tablet (5 mg total) by mouth daily. 90 tablet 1   BIOTIN 5000 PO Take by mouth.     finasteride (PROSCAR) 5 MG tablet Take 1/4 tablet by mouth daily 45 tablet 2   hydrochlorothiazide (HYDRODIURIL) 12.5 MG tablet Take 1 tablet (12.5 mg total) by mouth daily. 90 tablet 1   Multiple Vitamin (ONE-A-DAY ESSENTIAL PO) Take by mouth.     Probiotic Product (FORTIFY DAILY PROBIOTIC PO) Take by mouth.     SUMAtriptan (IMITREX) 100 MG tablet TAKE 1 TABLET BY MOUTH AS  NEEDED FOR MIGRAINE. MAY  REPEAT IN 2 HOURS IF  HEADACHE PERSISTS OR RECURS 10 tablet 1   triamcinolone (NASACORT) 55 MCG/ACT AERO nasal inhaler Place 2 sprays into the nose at bedtime. Into each nostril 302.4 mL 0   valsartan (DIOVAN) 160 MG tablet Take 1 tablet (160 mg total) by mouth daily. 90 tablet 1   verapamil (CALAN-SR) 120 MG CR tablet Take 1 tablet (120 mg total) by mouth 2 (two) times daily. 180 tablet 1   No current facility-administered medications for this visit.    Allergies as of 09/23/2021   (No Known Allergies)    Family History  Problem Relation Age of Onset   Hypertension Mother    Hyperlipidemia Mother    Stroke Mother    Depression Mother  Kidney disease Brother    Kidney disease Maternal Grandfather    Heart disease Maternal Grandfather    Prostate cancer Neg Hx     Social History   Socioeconomic History   Marital status: Married    Spouse name: Not on file   Number of children: Not on file   Years of education: Not on file   Highest education level: Not on file  Occupational History   Not on file  Tobacco Use   Smoking status: Former    Packs/day: 1.00    Years: 24.00    Total pack years: 24.00    Types: Cigarettes    Start date: 56    Quit date: 2017    Years since quitting: 6.6   Smokeless tobacco: Never  Vaping Use   Vaping Use: Never used  Substance and Sexual Activity   Alcohol use: Yes    Alcohol/week: 4.0 standard drinks of alcohol    Types: 4 Shots of liquor per week    Drug use: Yes    Frequency: 2.0 times per week    Comment: marijuana   Sexual activity: Yes  Other Topics Concern   Not on file  Social History Narrative   Not on file   Social Determinants of Health   Financial Resource Strain: Not on file  Food Insecurity: Not on file  Transportation Needs: Not on file  Physical Activity: Not on file  Stress: Not on file  Social Connections: Not on file  Intimate Partner Violence: Not on file    Review of Systems:    Constitutional: No weight loss, fever or chills Skin: No rash  Cardiovascular: No chest pain   Respiratory: No SOB Gastrointestinal: See HPI and otherwise negative Genitourinary: No dysuria  Neurological: No headache, dizziness or syncope Musculoskeletal: No new muscle or joint pain Hematologic: No bleeding  Psychiatric: No history of depression or anxiety   Physical Exam:  Vital signs: BP 122/88   Pulse 72   Ht '5\' 11"'$  (1.803 m)   Wt 212 lb 9.6 oz (96.4 kg)   SpO2 98%   BMI 29.65 kg/m    Constitutional:   Pleasant Caucasian male appears to be in NAD, Well developed, Well nourished, alert and cooperative Head:  Normocephalic and atraumatic. Eyes:   PEERL, EOMI. No icterus. Conjunctiva pink. Ears:  Normal auditory acuity. Neck:  Supple Throat: Oral cavity and pharynx without inflammation, swelling or lesion.  Respiratory: Respirations even and unlabored. Lungs clear to auscultation bilaterally.   No wheezes, crackles, or rhonchi.  Cardiovascular: Normal S1, S2. No MRG. Regular rate and rhythm. No peripheral edema, cyanosis or pallor.  Gastrointestinal:  Soft, nondistended, nontender. No rebound or guarding. Normal bowel sounds. No appreciable masses or hepatomegaly. Rectal:  Not performed.  Msk:  Symmetrical without gross deformities. Without edema, no deformity or joint abnormality.  Neurologic:  Alert and  oriented x4;  grossly normal neurologically.  Skin:   Dry and intact without significant lesions or  rashes. Psychiatric:  Demonstrates good judgement and reason without abnormal affect or behaviors.  RELEVANT LABS AND IMAGING: CBC    Component Value Date/Time   WBC 4.9 07/23/2021 0829   RBC 4.38 07/23/2021 0829   HGB 13.3 07/23/2021 0829   HCT 38.5 07/23/2021 0829   PLT 227 07/23/2021 0829   MCV 88 07/23/2021 0829   MCH 30.4 07/23/2021 0829   MCHC 34.5 07/23/2021 0829   RDW 12.7 07/23/2021 0829   LYMPHSABS 1.5 06/23/2020 0943   EOSABS  0.2 06/23/2020 0943   BASOSABS 0.0 06/23/2020 0943    CMP     Component Value Date/Time   NA 142 07/23/2021 0829   K 3.6 07/23/2021 0829   CL 101 07/23/2021 0829   CO2 22 07/23/2021 0829   GLUCOSE 93 07/23/2021 0829   BUN 12 07/23/2021 0829   CREATININE 0.77 07/23/2021 0829   CALCIUM 8.8 07/23/2021 0829   PROT 6.7 07/23/2021 0829   ALBUMIN 4.5 07/23/2021 0829   AST 13 07/23/2021 0829   ALT 28 07/23/2021 0829   ALKPHOS 86 07/23/2021 0829   BILITOT 0.3 07/23/2021 0829   GFRNONAA 104 01/23/2020 0919   GFRAA 120 01/23/2020 0919    Assessment: 1.  Abnormal CT of the liver: Showing 2 large "masses", thought to be likely hemangiomas, recent liver function test normal, no acute GI complaints or concerns  Plan: 1.  Ordered MRI of the abdomen with contrast, liver protocol for further evaluation.  Pending results from this we will discuss further work-up if necessary. 2.  Patient to follow in clinic with [place] per recommendations after imaging above.  He was assigned to Dr. Hilarie Fredrickson this afternoon.  Ellouise Newer, PA-C [facility] 09/23/2021, 2:44 PM  Cc: Lorrene Reid, PA-C

## 2021-09-23 NOTE — Progress Notes (Signed)
Addendum: Reviewed and agree with assessment and management plan. Vani Gunner M, MD  

## 2021-09-23 NOTE — Patient Instructions (Signed)
You have been scheduled for an MRI at [facility] (1st floor radiology) on Friday 10/02/21. Your appointment time is 8 am. Please arrive to admitting (at main entrance of the hospital) 30 minutes prior to your appointment time for registration purposes. Please make certain not to have anything to eat or drink 6 hours prior to your test. In addition, if you have any metal in your body, have a pacemaker or defibrillator, please be sure to let your ordering physician know. This test typically takes 45 minutes to 1 hour to complete. Should you need to reschedule, please call [phone] to do so.  _______________________________________________________  If you are age 6 or older, your body mass index should be between 23-30. Your Body mass index is 29.65 kg/m. If this is out of the aforementioned range listed, please consider follow up with your Primary Care Provider.  If you are age 64 or younger, your body mass index should be between 19-25. Your Body mass index is 29.65 kg/m. If this is out of the aformentioned range listed, please consider follow up with your Primary Care Provider.   ________________________________________________________  The [facility] providers would like to encourage you to use [facility] to communicate with providers for non-urgent requests or questions.  Due to long hold times on the telephone, sending your provider a message by [facility] may be a faster and more efficient way to get a response.  Please allow 48 business hours for a response.  Please remember that this is for non-urgent requests.  _______________________________________________________

## 2021-09-23 NOTE — Progress Notes (Signed)
Spoke w/ via phone for pre-op interview--- Paul Schroeder Lab needs dos----   ISTAT and EKG            Lab results------ COVID test -----patient states asymptomatic no test needed Arrive at -------1100 NPO after MN NO Solid Food.  Clear liquids from MN until---1000 Med rec completed Medications to take morning of surgery ----- Norvasc Diabetic medication ----- Patient instructed no nail polish to be worn day of surgery Patient instructed to bring photo id and insurance card day of surgery Patient aware to have Driver (ride ) / caregiver  Spouse Paul Schroeder  for 24 hours after surgery  Patient Special Instructions ----- Pre-Op special Istructions ----- Patient verbalized understanding of instructions that were given at this phone interview. Patient denies shortness of breath, chest pain, fever, cough at this phone interview.

## 2021-10-02 ENCOUNTER — Encounter (HOSPITAL_COMMUNITY): Payer: Self-pay

## 2021-10-02 ENCOUNTER — Ambulatory Visit (HOSPITAL_COMMUNITY): Admission: RE | Admit: 2021-10-02 | Payer: BC Managed Care – PPO | Source: Ambulatory Visit

## 2021-10-04 ENCOUNTER — Ambulatory Visit (HOSPITAL_COMMUNITY)
Admission: RE | Admit: 2021-10-04 | Discharge: 2021-10-04 | Disposition: A | Discharge status: Home / Self Care | Payer: BC Managed Care – PPO | Source: Ambulatory Visit | Attending: Physician Assistant | Admitting: Physician Assistant

## 2021-10-04 DIAGNOSIS — D1803 Hemangioma of intra-abdominal structures: Secondary | ICD-10-CM | POA: Diagnosis not present

## 2021-10-04 DIAGNOSIS — R932 Abnormal findings on diagnostic imaging of liver and biliary tract: Secondary | ICD-10-CM

## 2021-10-04 DIAGNOSIS — K760 Fatty (change of) liver, not elsewhere classified: Secondary | ICD-10-CM | POA: Diagnosis not present

## 2021-10-04 DIAGNOSIS — R160 Hepatomegaly, not elsewhere classified: Secondary | ICD-10-CM | POA: Diagnosis not present

## 2021-10-04 MED ORDER — GADOBUTROL 1 MMOL/ML IV SOLN
9.0000 mL | Freq: Once | INTRAVENOUS | Status: AC | PRN
Start: 2021-10-04 — End: 2021-10-04
  Administered 2021-10-04: 9 mL via INTRAVENOUS

## 2021-10-06 ENCOUNTER — Ambulatory Visit (HOSPITAL_BASED_OUTPATIENT_CLINIC_OR_DEPARTMENT_OTHER)
Admission: RE | Admit: 2021-10-06 | Discharge: 2021-10-06 | Disposition: A | Discharge status: Home / Self Care | Payer: BC Managed Care – PPO | Attending: Urology | Admitting: Urology

## 2021-10-06 ENCOUNTER — Encounter (HOSPITAL_BASED_OUTPATIENT_CLINIC_OR_DEPARTMENT_OTHER)
Admission: RE | Disposition: A | Discharge status: Home / Self Care | Payer: Self-pay | Source: Home / Self Care | Attending: Urology

## 2021-10-06 ENCOUNTER — Encounter (HOSPITAL_BASED_OUTPATIENT_CLINIC_OR_DEPARTMENT_OTHER): Payer: Self-pay | Admitting: Urology

## 2021-10-06 ENCOUNTER — Ambulatory Visit (HOSPITAL_BASED_OUTPATIENT_CLINIC_OR_DEPARTMENT_OTHER): Payer: BC Managed Care – PPO | Admitting: Anesthesiology

## 2021-10-06 ENCOUNTER — Other Ambulatory Visit: Payer: Self-pay

## 2021-10-06 DIAGNOSIS — I10 Essential (primary) hypertension: Secondary | ICD-10-CM | POA: Diagnosis not present

## 2021-10-06 DIAGNOSIS — G709 Myoneural disorder, unspecified: Secondary | ICD-10-CM | POA: Diagnosis not present

## 2021-10-06 DIAGNOSIS — Z79899 Other long term (current) drug therapy: Secondary | ICD-10-CM | POA: Insufficient documentation

## 2021-10-06 DIAGNOSIS — L720 Epidermal cyst: Secondary | ICD-10-CM | POA: Insufficient documentation

## 2021-10-06 DIAGNOSIS — F419 Anxiety disorder, unspecified: Secondary | ICD-10-CM | POA: Diagnosis not present

## 2021-10-06 DIAGNOSIS — Z87891 Personal history of nicotine dependence: Secondary | ICD-10-CM | POA: Diagnosis not present

## 2021-10-06 DIAGNOSIS — L729 Follicular cyst of the skin and subcutaneous tissue, unspecified: Secondary | ICD-10-CM | POA: Diagnosis not present

## 2021-10-06 HISTORY — DX: Essential (primary) hypertension: I10

## 2021-10-06 HISTORY — PX: CYST EXCISION: SHX5701

## 2021-10-06 LAB — POCT I-STAT, CHEM 8
BUN: 14 mg/dL (ref 6–20)
Calcium, Ion: 1.25 mmol/L (ref 1.15–1.40)
Chloride: 102 mmol/L (ref 98–111)
Creatinine, Ser: 0.6 mg/dL — ABNORMAL LOW (ref 0.61–1.24)
Glucose, Bld: 99 mg/dL (ref 70–99)
HCT: 38 % — ABNORMAL LOW (ref 39.0–52.0)
Hemoglobin: 12.9 g/dL — ABNORMAL LOW (ref 13.0–17.0)
Potassium: 3.1 mmol/L — ABNORMAL LOW (ref 3.5–5.1)
Sodium: 143 mmol/L (ref 135–145)
TCO2: 25 mmol/L (ref 22–32)

## 2021-10-06 SURGERY — CYST REMOVAL
Anesthesia: Monitor Anesthesia Care | Site: Groin | Laterality: Right

## 2021-10-06 MED ORDER — CEFAZOLIN SODIUM-DEXTROSE 2-4 GM/100ML-% IV SOLN
2.0000 g | INTRAVENOUS | Status: AC
  Administered 2021-10-06: 2 g via INTRAVENOUS

## 2021-10-06 MED ORDER — FENTANYL CITRATE (PF) 100 MCG/2ML IJ SOLN
INTRAMUSCULAR | Status: DC | PRN
  Administered 2021-10-06: 50 ug via INTRAVENOUS

## 2021-10-06 MED ORDER — MIDAZOLAM HCL 2 MG/2ML IJ SOLN
INTRAMUSCULAR | Status: DC | PRN
  Administered 2021-10-06: 2 mg via INTRAVENOUS

## 2021-10-06 MED ORDER — PROPOFOL 1000 MG/100ML IV EMUL
INTRAVENOUS | Status: AC
  Filled 2021-10-06: qty 200

## 2021-10-06 MED ORDER — ACETAMINOPHEN 500 MG PO TABS
ORAL_TABLET | ORAL | Status: AC
  Filled 2021-10-06: qty 2

## 2021-10-06 MED ORDER — PROPOFOL 10 MG/ML IV BOLUS
INTRAVENOUS | Status: DC | PRN
  Administered 2021-10-06: 20 mg via INTRAVENOUS

## 2021-10-06 MED ORDER — CELECOXIB 200 MG PO CAPS
200.0000 mg | ORAL_CAPSULE | Freq: Once | ORAL | Status: AC
  Administered 2021-10-06: 200 mg via ORAL

## 2021-10-06 MED ORDER — BUPIVACAINE HCL (PF) 0.25 % IJ SOLN
INTRAMUSCULAR | Status: DC | PRN
  Administered 2021-10-06: 10 mL

## 2021-10-06 MED ORDER — FENTANYL CITRATE (PF) 100 MCG/2ML IJ SOLN: 25.0000 ug | INTRAMUSCULAR | Status: DC | PRN

## 2021-10-06 MED ORDER — CEFAZOLIN SODIUM-DEXTROSE 2-4 GM/100ML-% IV SOLN
INTRAVENOUS | Status: AC
  Filled 2021-10-06: qty 100

## 2021-10-06 MED ORDER — ONDANSETRON HCL 4 MG/2ML IJ SOLN
INTRAMUSCULAR | Status: DC | PRN
  Administered 2021-10-06: 4 mg via INTRAVENOUS

## 2021-10-06 MED ORDER — HYDROCODONE-ACETAMINOPHEN 5-325 MG PO TABS: 1.0000 | ORAL_TABLET | ORAL | 0 refills | Status: DC | PRN

## 2021-10-06 MED ORDER — PROPOFOL 500 MG/50ML IV EMUL
INTRAVENOUS | Status: DC | PRN
  Administered 2021-10-06: 75 ug/kg/min via INTRAVENOUS

## 2021-10-06 MED ORDER — ACETAMINOPHEN 500 MG PO TABS
1000.0000 mg | ORAL_TABLET | Freq: Once | ORAL | Status: AC
  Administered 2021-10-06: 1000 mg via ORAL

## 2021-10-06 MED ORDER — MIDAZOLAM HCL 2 MG/2ML IJ SOLN
INTRAMUSCULAR | Status: AC
  Filled 2021-10-06: qty 2

## 2021-10-06 MED ORDER — 0.9 % SODIUM CHLORIDE (POUR BTL) OPTIME
TOPICAL | Status: DC | PRN
  Administered 2021-10-06: 500 mL

## 2021-10-06 MED ORDER — MEPERIDINE HCL 25 MG/ML IJ SOLN: 6.2500 mg | INTRAMUSCULAR | Status: DC | PRN

## 2021-10-06 MED ORDER — PROMETHAZINE HCL 25 MG/ML IJ SOLN: 6.2500 mg | INTRAMUSCULAR | Status: DC | PRN

## 2021-10-06 MED ORDER — LACTATED RINGERS IV SOLN: INTRAVENOUS | Status: DC

## 2021-10-06 MED ORDER — OXYCODONE HCL 5 MG/5ML PO SOLN: 5.0000 mg | Freq: Once | ORAL | Status: DC | PRN

## 2021-10-06 MED ORDER — OXYCODONE HCL 5 MG PO TABS: 5.0000 mg | ORAL_TABLET | Freq: Once | ORAL | Status: DC | PRN

## 2021-10-06 MED ORDER — FENTANYL CITRATE (PF) 100 MCG/2ML IJ SOLN
INTRAMUSCULAR | Status: AC
  Filled 2021-10-06: qty 2

## 2021-10-06 MED ORDER — DEXMEDETOMIDINE (PRECEDEX) IN NS 20 MCG/5ML (4 MCG/ML) IV SYRINGE
PREFILLED_SYRINGE | INTRAVENOUS | Status: DC | PRN
  Administered 2021-10-06: 8 ug via INTRAVENOUS

## 2021-10-06 MED ORDER — CELECOXIB 200 MG PO CAPS
ORAL_CAPSULE | ORAL | Status: AC
  Filled 2021-10-06: qty 1

## 2021-10-06 SURGICAL SUPPLY — 46 items
ADH SKN CLS APL DERMABOND .7 (GAUZE/BANDAGES/DRESSINGS) ×2
APL SWBSTK 6 STRL LF DISP (MISCELLANEOUS)
APPLICATOR COTTON TIP 6 STRL (MISCELLANEOUS) IMPLANT
APPLICATOR COTTON TIP 6IN STRL (MISCELLANEOUS)
BAG DRN RND TRDRP ANRFLXCHMBR (UROLOGICAL SUPPLIES)
BAG URINE DRAIN 2000ML AR STRL (UROLOGICAL SUPPLIES) IMPLANT
BLADE CLIPPER SENSICLIP SURGIC (BLADE) IMPLANT
BLADE SURG 15 STRL LF DISP TIS (BLADE) ×1 IMPLANT
BLADE SURG 15 STRL SS (BLADE) ×1
CATH FOLEY 2WAY SLVR  5CC 16FR (CATHETERS)
CATH FOLEY 2WAY SLVR 5CC 16FR (CATHETERS) IMPLANT
COVER BACK TABLE 60X90IN (DRAPES) ×1 IMPLANT
COVER MAYO STAND STRL (DRAPES) ×1 IMPLANT
DERMABOND ADVANCED (GAUZE/BANDAGES/DRESSINGS) ×2
DERMABOND ADVANCED .7 DNX12 (GAUZE/BANDAGES/DRESSINGS) IMPLANT
DISSECTOR ROUND CHERRY 3/8 STR (MISCELLANEOUS) IMPLANT
DRAIN PENROSE 0.5X18 (DRAIN) ×1 IMPLANT
DRAPE LAPAROTOMY 100X72 PEDS (DRAPES) ×1 IMPLANT
DRSG TEGADERM 4X4.75 (GAUZE/BANDAGES/DRESSINGS) IMPLANT
ELECT NDL TIP 2.8 STRL (NEEDLE) ×1 IMPLANT
ELECT NEEDLE TIP 2.8 STRL (NEEDLE) IMPLANT
ELECT REM PT RETURN 9FT ADLT (ELECTROSURGICAL) ×1
ELECTRODE REM PT RTRN 9FT ADLT (ELECTROSURGICAL) ×1 IMPLANT
GAUZE 4X4 16PLY ~~LOC~~+RFID DBL (SPONGE) ×1 IMPLANT
GLOVE BIO SURGEON STRL SZ7.5 (GLOVE) ×1 IMPLANT
GOWN STRL REUS W/TWL LRG LVL3 (GOWN DISPOSABLE) ×1 IMPLANT
KIT TURNOVER CYSTO (KITS) ×1 IMPLANT
MANIFOLD NEPTUNE II (INSTRUMENTS) IMPLANT
NDL HYPO 25X1 1.5 SAFETY (NEEDLE) ×1 IMPLANT
NEEDLE HYPO 25X1 1.5 SAFETY (NEEDLE) ×1 IMPLANT
NS IRRIG 500ML POUR BTL (IV SOLUTION) IMPLANT
PACK BASIN DAY SURGERY FS (CUSTOM PROCEDURE TRAY) ×1 IMPLANT
PENCIL SMOKE EVACUATOR (MISCELLANEOUS) ×1 IMPLANT
SOL PREP POV-IOD 4OZ 10% (MISCELLANEOUS) IMPLANT
SUPPORT SCROTAL LG STRP (MISCELLANEOUS) ×1 IMPLANT
SUT CHROMIC 4 0 PS 2 18 (SUTURE) IMPLANT
SUT MNCRL AB 4-0 PS2 18 (SUTURE) IMPLANT
SUT VIC AB 4-0 PS2 27 (SUTURE) IMPLANT
SUT VIC AB 4-0 SH 27 (SUTURE)
SUT VIC AB 4-0 SH 27XANBCTRL (SUTURE) IMPLANT
SYR CONTROL 10ML LL (SYRINGE) ×1 IMPLANT
TOWEL OR 17X26 10 PK STRL BLUE (TOWEL DISPOSABLE) ×1 IMPLANT
TRAY DSU PREP LF (CUSTOM PROCEDURE TRAY) ×1 IMPLANT
TUBE CONNECTING 12X1/4 (SUCTIONS) IMPLANT
WATER STERILE IRR 500ML POUR (IV SOLUTION) IMPLANT
YANKAUER SUCT BULB TIP NO VENT (SUCTIONS) IMPLANT

## 2021-10-06 NOTE — H&P (Signed)
CC/HPI: cc: Cyst in groin   09/03/2021: 51 year old man found to have a complex subcentimeter cyst in his right groin. This has been bothering him for 1 to 2 years. He was evaluated [facility] and given Tylenol and baclofen. He says it flares up with bending and lifting. He does have point tenderness. No scrotal/testicular pain.     ALLERGIES: No Known Allergies    MEDICATIONS: Finasteride 5 mg tablet  Hydrochlorothiazide 12.5 mg capsule  Amlodipine Besylate 5 mg tablet  Biotin  Cinnamon  Imitrex 100 mg tablet  Lysine  Multivitamin  Probiotic  Proscar  Sumatriptan Succinate 100 mg tablet  Triamcinolone Acetonide 55 mcg aerosol, spray  Tylenol Extra Strength 500 mg tablet  Valsartan 160 mg tablet  Verapamil Er 120 mg capsule, extended release pellets 24 hr  Vitamin C     GU PSH: None   NON-GU PSH: None   GU PMH: None   NON-GU PMH: None   FAMILY HISTORY: None   SOCIAL HISTORY: Marital Status: Married    REVIEW OF SYSTEMS:    GU Review Male:   Patient reports frequent urination, burning/ pain with urination, get up at night to urinate, trouble starting your stream, and erection problems. Patient denies hard to postpone urination, leakage of urine, stream starts and stops, have to strain to urinate , and penile pain.  Gastrointestinal (Upper):   Patient denies nausea, vomiting, and indigestion/ heartburn.  Gastrointestinal (Lower):   Patient reports diarrhea. Patient denies constipation.  Constitutional:   Patient reports fatigue. Patient denies fever, night sweats, and weight loss.  Skin:   Patient denies skin rash/ lesion and itching.  Eyes:   Patient reports blurred vision. Patient denies double vision.  Ears/ Nose/ Throat:   Patient reports sinus problems. Patient denies sore throat.  Hematologic/Lymphatic:   Patient denies swollen glands and easy bruising.  Cardiovascular:   Patient denies leg swelling and chest pains.  Respiratory:   Patient  denies cough and shortness of breath.  Endocrine:   Patient denies excessive thirst.  Musculoskeletal:   Patient denies back pain and joint pain.  Neurological:   Patient reports headaches. Patient denies dizziness.  Psychologic:   Patient reports anxiety. Patient denies depression.   VITAL SIGNS:      09/03/2021 08:40 AM  Weight 205 lb / 92.99 kg  Height 71 in / 180.34 cm  BP 119/80 mmHg  Pulse 61 /min  Temperature 97.3 F / 36.2 C  BMI 28.6 kg/m   GU PHYSICAL EXAMINATION:    Scrotum: No lesions. No edema. No cysts. No warts.  Epididymides: Right: no spermatocele, no masses, no cysts, no tenderness, no induration, no enlargement. Left: no spermatocele, no masses, no cysts, no tenderness, no induration, no enlargement.  Testes: No tenderness, no swelling, no enlargement left testes. No tenderness, no swelling, no enlargement right testes. Normal location left testes. Normal location right testes. No mass, no cyst, no varicocele, no hydrocele left testes. No mass, no cyst, no varicocele, no hydrocele right testes.  Urethral Meatus: Normal size. No lesion, no wart, no discharge, no polyp. Normal location.   MULTI-SYSTEM PHYSICAL EXAMINATION:    Constitutional: Well-nourished. No physical deformities. Normally developed. Good grooming.  Neck: Neck symmetrical, not swollen. Normal tracheal position.  Respiratory: No labored breathing, no use of accessory muscles.   Skin: No paleness, no jaundice, no cyanosis. No lesion, no ulcer, no rash.  Neurologic / Psychiatric: Oriented to time, oriented to place, oriented to person. No depression, no anxiety,  no agitation.  Gastrointestinal: Right subinguinal area with mobile superficial approximately 1 cm cyst like structure, tenderness palpation  Eyes: Normal conjunctivae. Normal eyelids.  Ears, Nose, Mouth, and Throat: Left ear no scars, no lesions, no masses. Right ear no scars, no lesions, no masses. Nose no scars, no lesions, no masses. Normal  hearing. Normal lips.  Musculoskeletal: Normal gait and station of head and neck.     Complexity of Data:  Records Review:   Previous Patient Records, POC Tool  Urine Test Review:   Urinalysis  X-Ray Review: C.T. Abdomen/Pelvis: Reviewed Films. Reviewed Report. Discussed With Patient. IMPRESSION:  Hepatomegaly. Two enhancing hepatic masses as described which most  likely represent hemangiomas, however no additional phases available  for confirmation. Recommend follow-up MRI abdomen with contrast.    Electronically Signed  By: Ofilia Neas M.D.  On: 08/25/2021 16:05   Notes:                     US pelvis:   IMPRESSION:  Complex cyst within the right groin which corresponds to the area of  palpable abnormality on physical examination. Further evaluation  with contrast-enhanced pelvis CT is recommended form proved  characterization.    Electronically Signed  By: Virgina Norfolk M.D.  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 Cyst in right groin:  -Cyst appears superficial confirmed by ultrasound and has point tenderness  -Discussed excising under MAC and local; risks and benefits of the procedure discussed the patient detail including but not limited to pain, bleeding, recurrence, infection, damage surrounding structures  -I also discussed that this may not fix patient's groin pain. We discussed that they will be some inflammation following the surgery and  at 6 weeks following surgery if healing is gone well and he has persistent pain we will discuss other options such as pelvic floor physical therapy.

## 2021-10-06 NOTE — Op Note (Addendum)
Operative Note  Preoperative diagnosis:  1.  Right groin cyst  Postoperative diagnosis: 1.  Right groin cyst  Procedure(s): 1.  Excision of right groin cyst  Surgeon: Jacalyn Lefevre, MD  Assistants:  None  Anesthesia:  General  Complications:  None  EBL:  minimal  Specimens: 1. Right groin cyst  Drains/Catheters: 1.  none  Intraoperative findings:   5 mm cyst appearing consistent with epidermal inclusion cyst  Indication:  Paul Schroeder is a 51 y.o. male with right nodule that is pain.  Physical exam consistent with superficial subcutaneous cyst.  Description of procedure:  After risks and benefits of procedure were discussed in detail, informed consent was obtained. The patient was taken to the operating room and placed in the supine position.  Anesthesia was induced and antibiotics were administered.  The patient was prepped and draped in usual sterile fashion and timeout was performed.  Quarter percent Marcaine plain was then injected over the palpable cyst.  A marking pen was used to make an ellipse around the cyst.  It was then sharply excised with a knife.  The Bovie was used to achieve hemostasis.  The cyst was then sent to pathology.  It appeared consistent with an epidermal inclusion cyst.  The incision was then closed in 2 layers with 4-0 Vicryl and 4 Monocryl.  Dermabond was then applied.  The patient emerged from anesthesia and transferred the PACU in stable condition.  Plan:  Discharge home today

## 2021-10-06 NOTE — Interval H&P Note (Signed)
History and Physical Interval Note:  10/06/2021 12:25 PM  Paul Schroeder  has presented today for surgery, with the diagnosis of RIGHT GROIN CYST.  The various methods of treatment have been discussed with the patient and family. After consideration of risks, benefits and other options for treatment, the patient has consented to  Procedure(s) with comments: EXCISION OF  GROIN CYST (Right) - 45 MINS as a surgical intervention.  The patient's history has been reviewed, patient examined, no change in status, stable for surgery.  I have reviewed the patient's chart and labs.  Questions were answered to the patient's satisfaction.     Paul Schroeder D Paul Schroeder

## 2021-10-06 NOTE — Discharge Instructions (Addendum)
Scrotal surgery postoperative instructions  Wound:  In most cases your incision will have absorbable sutures that will dissolve within the first 10-20 days. Some will fall out even earlier. Expect some redness as the sutures dissolved but this should occur only around the sutures. If there is generalized redness, especially with increasing pain or swelling, let us know. The scrotum will very likely get "black and blue" as the blood in the tissues spread. Sometimes the whole scrotum will turn colors. The black and blue is followed by a yellow and brown color. In time, all the discoloration will go away. In some cases some firm swelling in the area of the testicle may persist for up to 4-6 weeks after the surgery and is considered normal in most cases.  Diet:  You may return to your normal diet within 24 hours following your surgery. You may note some mild nausea and possibly vomiting the first 6-8 hours following surgery. This is usually due to the side effects of anesthesia, and will disappear quite soon. I would suggest clear liquids and a very light meal the first evening following your surgery.  Activity:  Your physical activity should be restricted the first 48 hours. During that time you should remain relatively inactive, moving about only when necessary. During the first 7-10 days following surgery he should avoid lifting any heavy objects (anything greater than 15 pounds), and avoid strenuous exercise. If you work, ask [place] specifically about your restrictions, both for work and home. We will write a note to your employer if needed.  You should plan to wear a tight pair of jockey shorts or an athletic supporter for the first 4-5 days, even to sleep. This will keep the scrotum immobilized to some degree and keep the swelling down.  Ice packs should be placed on and off over the scrotum for the first 48 hours. Frozen peas or corn in a ZipLock bag can be frozen, used and re-frozen. Fifteen minutes  on and 15 minutes off is a reasonable schedule. The ice is a good pain reliever and keeps the swelling down.  Hygiene:  You may shower 48 hours after your surgery. Tub bathing should be restricted until the seventh day.   Medication:  You will be sent home with some type of pain medication. In many cases you will be sent home with a narcotic pain pill (hydrococone or oxycodone). If the pain is not too bad, you may take either Tylenol (acetaminophen) or Advil (ibuprofen) which contain no narcotic agents, and might be tolerated a little better, with fewer side effects. If the pain medication you are sent home with does not control the pain, you will have to let us know. Some narcotic pain medications cannot be given or refilled by a phone call to a pharmacy.  Problems you should report to [place]:  Fever of 101.0 degrees Fahrenheit or greater. Moderate or severe swelling under the skin incision or involving the scrotum. Drug reaction such as hives, a rash, nausea or vomiting.    No acetaminophen/Tylenol until after 5:24pm today if needed for pain.  No ibuprofen, Advil, Aleve, Motrin, ketorolac, meloxicam, naproxen, or other NSAIDS until after 5:24pm today if needed for pain.     Post Anesthesia Home Care Instructions  Activity: Get plenty of rest for the remainder of the day. A responsible individual must stay with you for 24 hours following the procedure.  For the next 24 hours, DO NOT: -Drive a car -Paediatric nurse -Drink alcoholic beverages -Take  any medication unless instructed by your physician -Make any legal decisions or sign important papers.  Meals: Start with liquid foods such as gelatin or soup. Progress to regular foods as tolerated. Avoid greasy, spicy, heavy foods. If nausea and/or vomiting occur, drink only clear liquids until the nausea and/or vomiting subsides. Call your physician if vomiting continues.  Special Instructions/Symptoms: Your throat may feel dry or  sore from the anesthesia or the breathing tube placed in your throat during surgery. If this causes discomfort, gargle with warm salt water. The discomfort should disappear within 24 hours.  If you had a scopolamine patch placed behind your ear for the management of post- operative nausea and/or vomiting:  1. The medication in the patch is effective for 72 hours, after which it should be removed.  Wrap patch in a tissue and discard in the trash. Wash hands thoroughly with soap and water. 2. You may remove the patch earlier than 72 hours if you experience unpleasant side effects which may include dry mouth, dizziness or visual disturbances. 3. Avoid touching the patch. Wash your hands with soap and water after contact with the patch.

## 2021-10-06 NOTE — Anesthesia Postprocedure Evaluation (Signed)
Anesthesia Post Note  Patient: Paul Schroeder  Procedure(s) Performed: EXCISION OF  GROIN CYST (Right: Groin)     Patient location during evaluation: PACU Anesthesia Type: MAC Level of consciousness: awake and alert Pain management: pain level controlled Vital Signs Assessment: post-procedure vital signs reviewed and stable Respiratory status: spontaneous breathing Cardiovascular status: stable Anesthetic complications: no   No notable events documented.  Last Vitals:  Vitals:   10/06/21 1315 10/06/21 1330  BP: 104/68 105/73  Pulse: (!) 48 (!) 50  Resp: 14 17  Temp:  36.8 C  SpO2: 95% 94%    Last Pain:  Vitals:   10/06/21 1124  TempSrc: Oral                 Nolon Nations

## 2021-10-06 NOTE — Anesthesia Preprocedure Evaluation (Addendum)
Anesthesia Evaluation    Reviewed: Allergy & Precautions, H&P , Patient's Chart, lab work & pertinent test results, Unable to perform ROS - Chart review only  Airway Mallampati: II  TM Distance: >3 FB Neck ROM: Full    Dental no notable dental hx. (+) Dental Advisory Given, Teeth Intact   Pulmonary neg pulmonary ROS, former smoker,    Pulmonary exam normal breath sounds clear to auscultation       Cardiovascular Exercise Tolerance: Good hypertension, On Medications (-) angina(-) Past MI and (-) Cardiac Stents Normal cardiovascular exam(-) dysrhythmias (-) Valvular Problems/Murmurs Rhythm:Regular Rate:Normal     Neuro/Psych  Headaches, neg Seizures PSYCHIATRIC DISORDERS Anxiety  Neuromuscular disease    GI/Hepatic Neg liver ROS, GERD  ,  Endo/Other  negative endocrine ROS  Renal/GU negative Renal ROS  negative genitourinary   Musculoskeletal   Abdominal   Peds  Hematology negative hematology ROS (+)   Anesthesia Other Findings Past Medical History: No date: Allergy No date: Anxiety No date: Bronchitis 2013: Cancer ([place])     Comment:  Malignant mole on his back removed  No date: Hematospermia No date: Hemorrhoids No date: Migraines     Comment:  family Hx, triggered by stress No date: Seasonal allergies   Reproductive/Obstetrics negative OB ROS                            Anesthesia Physical  Anesthesia Plan  ASA: 2  Anesthesia Plan: MAC   Post-op Pain Management: Tylenol PO (pre-op)*, Minimal or no pain anticipated and Celebrex PO (pre-op)*   Induction: Intravenous  PONV Risk Score and Plan: 1 and TIVA, Propofol infusion and Treatment may vary due to age or medical condition  Airway Management Planned: Natural Airway and Nasal Cannula  Additional Equipment:   Intra-op Plan:   Post-operative Plan:   Informed Consent: I have reviewed the patients History and Physical, chart,  labs and discussed the procedure including the risks, benefits and alternatives for the proposed anesthesia with the patient or authorized representative who has indicated his/her understanding and acceptance.     Dental advisory given  Plan Discussed with: CRNA  Anesthesia Plan Comments:        Anesthesia Quick Evaluation

## 2021-10-06 NOTE — Transfer of Care (Signed)
Immediate Anesthesia Transfer of Care Note  Patient: Paul Schroeder  Procedure(s) Performed: EXCISION OF  GROIN CYST (Right: Groin)  Patient Location: PACU  Anesthesia Type:MAC  Level of Consciousness: awake, alert , oriented and patient cooperative  Airway & Oxygen Therapy: Patient Spontanous Breathing  Post-op Assessment: Report given to RN and Post -op Vital signs reviewed and stable  Post vital signs: Reviewed and stable  Last Vitals:  Vitals Value Taken Time  BP 103/75 10/06/21 1306  Temp    Pulse 49 10/06/21 1308  Resp 15 10/06/21 1308  SpO2 97 % 10/06/21 1308  Vitals shown include unvalidated device data.  Last Pain:  Vitals:   10/06/21 1124  TempSrc: Oral      Patients Stated Pain Goal: 6 (01/58/68 2574)  Complications: No notable events documented.

## 2021-10-07 ENCOUNTER — Encounter (HOSPITAL_BASED_OUTPATIENT_CLINIC_OR_DEPARTMENT_OTHER): Payer: Self-pay | Admitting: Urology

## 2021-10-07 LAB — SURGICAL PATHOLOGY

## 2021-10-13 DIAGNOSIS — L729 Follicular cyst of the skin and subcutaneous tissue, unspecified: Secondary | ICD-10-CM | POA: Diagnosis not present

## 2021-11-27 DIAGNOSIS — N5201 Erectile dysfunction due to arterial insufficiency: Secondary | ICD-10-CM | POA: Diagnosis not present

## 2021-11-27 DIAGNOSIS — L729 Follicular cyst of the skin and subcutaneous tissue, unspecified: Secondary | ICD-10-CM | POA: Diagnosis not present

## 2022-01-18 DIAGNOSIS — L905 Scar conditions and fibrosis of skin: Secondary | ICD-10-CM | POA: Diagnosis not present

## 2022-01-18 DIAGNOSIS — L821 Other seborrheic keratosis: Secondary | ICD-10-CM | POA: Diagnosis not present

## 2022-01-18 DIAGNOSIS — D225 Melanocytic nevi of trunk: Secondary | ICD-10-CM | POA: Diagnosis not present

## 2022-01-18 DIAGNOSIS — X32XXXD Exposure to sunlight, subsequent encounter: Secondary | ICD-10-CM | POA: Diagnosis not present

## 2022-01-18 DIAGNOSIS — L570 Actinic keratosis: Secondary | ICD-10-CM | POA: Diagnosis not present

## 2022-02-05 ENCOUNTER — Ambulatory Visit: Payer: BC Managed Care – PPO | Admitting: Physician Assistant

## 2022-02-16 ENCOUNTER — Telehealth: Payer: Self-pay | Admitting: *Deleted

## 2022-02-16 ENCOUNTER — Other Ambulatory Visit: Payer: Self-pay | Admitting: Nurse Practitioner

## 2022-02-16 DIAGNOSIS — G43809 Other migraine, not intractable, without status migrainosus: Secondary | ICD-10-CM

## 2022-02-16 MED ORDER — SUMATRIPTAN SUCCINATE 100 MG PO TABS: ORAL_TABLET | ORAL | 1 refills | Status: DC

## 2022-02-16 NOTE — Telephone Encounter (Signed)
I just [facility] a [facility]

## 2022-02-16 NOTE — Telephone Encounter (Signed)
[facility] calling for a Rx authorization renewal for patients Sumatriptan '100mg'$ . Confirmed that he would like this to go through [facility].  Their number is [phone] and the Reference #[id number].

## 2022-02-25 ENCOUNTER — Encounter: Payer: Self-pay | Admitting: Nurse Practitioner

## 2022-02-25 ENCOUNTER — Ambulatory Visit: Payer: BC Managed Care – PPO | Admitting: Nurse Practitioner

## 2022-02-25 VITALS — BP 119/76 | HR 63 | Ht 71.0 in | Wt 222.0 lb

## 2022-02-25 DIAGNOSIS — Z87891 Personal history of nicotine dependence: Secondary | ICD-10-CM

## 2022-02-25 DIAGNOSIS — G43101 Migraine with aura, not intractable, with status migrainosus: Secondary | ICD-10-CM

## 2022-02-25 DIAGNOSIS — G43909 Migraine, unspecified, not intractable, without status migrainosus: Secondary | ICD-10-CM | POA: Diagnosis not present

## 2022-02-25 DIAGNOSIS — I10 Essential (primary) hypertension: Secondary | ICD-10-CM | POA: Diagnosis not present

## 2022-02-25 MED ORDER — VERAPAMIL HCL ER 120 MG PO TBCR: 120.0000 mg | EXTENDED_RELEASE_TABLET | Freq: Two times a day (BID) | ORAL | 3 refills | Status: DC

## 2022-02-25 MED ORDER — VALSARTAN 160 MG PO TABS: 160.0000 mg | ORAL_TABLET | Freq: Every day | ORAL | 3 refills | Status: DC

## 2022-02-25 MED ORDER — NURTEC 75 MG PO TBDP: ORAL_TABLET | ORAL | 0 refills | Status: DC

## 2022-02-25 NOTE — Progress Notes (Signed)
Established patient visit   Patient: Paul Schroeder   DOB: Nov 24, 1970   52 y.o. Male  MRN: [id number] Visit Date: 02/25/2022   Chief Complaint  Patient presents with   Follow-up   Subjective    HPI  Follow up  -hypertension  --generally well controlled  -blood pressure reading low the past two visits.  -states that at home, average blood pressure is 128/80  -migraine headaches --generally well controlled  --getting more frequent and lasting for a longer period of time.  -lung cancer screening.  --patient is a former smoker. States that he smoked for more than 20 years and quit smoking more that 20 years ago.  -having joint pain in fingers, hands and wrists.  --he is Dance movement psychotherapist  --does a lot of work with his hands.  --left hand does bother him more than right. He is right handed.     Medications: Outpatient Medications Prior to Visit  Medication Sig   BIOTIN 5000 PO Take by mouth.   finasteride (PROSCAR) 5 MG tablet Take 1/4 tablet by mouth daily   hydrochlorothiazide (HYDRODIURIL) 12.5 MG tablet Take 1 tablet (12.5 mg total) by mouth daily.   HYDROcodone-acetaminophen (NORCO/VICODIN) 5-325 MG tablet Take 1 tablet by mouth every 4 (four) hours as needed for moderate pain.   Multiple Vitamin (ONE-A-DAY ESSENTIAL PO) Take by mouth.   Probiotic Product (FORTIFY DAILY PROBIOTIC PO) Take by mouth.   SUMAtriptan (IMITREX) 100 MG tablet TAKE 1 TABLET BY MOUTH AS  NEEDED FOR MIGRAINE. MAY  REPEAT IN 2 HOURS IF  HEADACHE PERSISTS OR RECURS   triamcinolone (NASACORT) 55 MCG/ACT AERO nasal inhaler Place 2 sprays into the nose at bedtime. Into each nostril   [DISCONTINUED] amLODipine (NORVASC) 5 MG tablet Take 1 tablet (5 mg total) by mouth daily.   [DISCONTINUED] valsartan (DIOVAN) 160 MG tablet Take 1 tablet (160 mg total) by mouth daily.   [DISCONTINUED] verapamil (CALAN-SR) 120 MG CR tablet Take 1 tablet (120 mg total) by mouth 2 (two) times daily.   No  facility-administered medications prior to visit.    Review of Systems  Constitutional:  Negative for activity change, chills, fatigue and fever.  HENT:  Negative for congestion, postnasal drip, rhinorrhea, sinus pressure, sinus pain, sneezing and sore throat.   Eyes: Negative.   Respiratory:  Negative for cough, shortness of breath and wheezing.   Cardiovascular:  Negative for chest pain and palpitations.  Gastrointestinal:  Negative for constipation, diarrhea, nausea and vomiting.  Endocrine: Negative for cold intolerance, heat intolerance, polydipsia and polyuria.  Genitourinary:  Negative for dysuria, frequency and urgency.  Musculoskeletal:  Positive for arthralgias and joint swelling. Negative for back pain and myalgias.       Joint pain in fingers and wrists, especially of right hand.   Skin:  Negative for rash.  Allergic/Immunologic: Negative for environmental allergies.  Neurological:  Negative for dizziness, weakness and headaches.  Psychiatric/Behavioral:  The patient is not nervous/anxious.     Last CBC Lab Results  Component Value Date   WBC 4.9 07/23/2021   HGB 12.9 (L) 10/06/2021   HCT 38.0 (L) 10/06/2021   MCV 88 07/23/2021   MCH 30.4 07/23/2021   RDW 12.7 07/23/2021   PLT 227 [id number]   Last metabolic panel Lab Results  Component Value Date   GLUCOSE 99 10/06/2021   NA 143 10/06/2021   K 3.1 (L) 10/06/2021   CL 102 10/06/2021   CO2 22 07/23/2021   BUN 14 10/06/2021  CREATININE 0.60 (L) 10/06/2021   EGFR 108 07/23/2021   CALCIUM 8.8 07/23/2021   PHOS 2.4 (L) 08/19/2017   PROT 6.7 07/23/2021   ALBUMIN 4.5 07/23/2021   LABGLOB 2.2 07/23/2021   AGRATIO 2.0 07/23/2021   BILITOT 0.3 07/23/2021   ALKPHOS 86 07/23/2021   AST 13 07/23/2021   ALT 28 07/23/2021   Last lipids Lab Results  Component Value Date   CHOL 175 07/23/2021   HDL 47 07/23/2021   LDLCALC 113 (H) 07/23/2021   TRIG 80 07/23/2021   CHOLHDL 3.7 07/23/2021   Last hemoglobin  A1c Lab Results  Component Value Date   HGBA1C 5.2 01/19/2021   Last thyroid functions Lab Results  Component Value Date   TSH 2.770 01/19/2021   T3TOTAL 111 03/02/2018   Last vitamin D Lab Results  Component Value Date   VD25OH 61.1 01/19/2021       Objective     Today's Vitals   02/25/22 1416 02/25/22 1458  BP: 103/69 119/76  Pulse: 63   SpO2: 96%   Weight: 222 lb (100.7 kg)   Height: 5' 11"$  (1.803 m)    Body mass index is 30.96 kg/m.  BP Readings from Last 3 Encounters:  02/25/22 119/76  10/06/21 103/78  09/23/21 122/88    Wt Readings from Last 3 Encounters:  02/25/22 222 lb (100.7 kg)  10/06/21 211 lb 3.2 oz (95.8 kg)  09/23/21 212 lb 9.6 oz (96.4 kg)    Physical Exam Vitals and nursing note reviewed.  Constitutional:      Appearance: Normal appearance. He is well-developed.  HENT:     Head: Normocephalic and atraumatic.  Eyes:     Pupils: Pupils are equal, round, and reactive to light.  Cardiovascular:     Rate and Rhythm: Normal rate and regular rhythm.     Pulses: Normal pulses.     Heart sounds: Normal heart sounds.  Pulmonary:     Effort: Pulmonary effort is normal.     Breath sounds: Normal breath sounds.  Abdominal:     Palpations: Abdomen is soft.  Musculoskeletal:        General: Normal range of motion.     Cervical back: Normal range of motion and neck supple.  Lymphadenopathy:     Cervical: No cervical adenopathy.  Skin:    General: Skin is warm and dry.     Capillary Refill: Capillary refill takes less than 2 seconds.  Neurological:     General: No focal deficit present.     Mental Status: He is alert and oriented to person, place, and time.  Psychiatric:        Mood and Affect: Mood normal.        Behavior: Behavior normal.        Thought Content: Thought content normal.        Judgment: Judgment normal.      Assessment & Plan    1. Essential hypertension Blood pressure running low. Discontinue norvasc. Continue other bp  medication as prescribed. Continue to monitor blood pressure at home. Goal is to have bp 130/80 or better.  - valsartan (DIOVAN) 160 MG tablet; Take 1 tablet (160 mg total) by mouth daily.  Dispense: 90 tablet; Refill: 3  2. Migraine with aura and with status migrainosus, not intractable Continue verapamil SR twice daily to reduce frequency and severity of migraine headaches.  - verapamil (CALAN-SR) 120 MG CR tablet; Take 1 tablet (120 mg total) by mouth 2 (two) times daily.  Dispense: 180 tablet; Refill: 3  3. Acute migraine Add nurtec 75 mg when needed for acute migraine. Samples provided. Will send prescription to pharmacy if effective.  - Rimegepant Sulfate (NURTEC) 75 MG TBDP; Take 1 tablet by mouth x 1 dose for acute headache. (Max 75 mg/day)  Dispense: 10 tablet; Refill: 0  4. Former heavy cigarette smoker (20-39 per day) Low dose Ct scan ordered for lung cancer screening.  - CT CHEST LUNG CANCER SCREENING LOW DOSE WO CONTRAST; Future   Problem List Items Addressed This Visit       Cardiovascular and Mediastinum   HTN (hypertension) - Primary (Chronic)   Relevant Medications   valsartan (DIOVAN) 160 MG tablet   verapamil (CALAN-SR) 120 MG CR tablet   Acute migraine   Relevant Medications   valsartan (DIOVAN) 160 MG tablet   verapamil (CALAN-SR) 120 MG CR tablet   Rimegepant Sulfate (NURTEC) 75 MG TBDP     Other   Former heavy cigarette smoker (20-39 per day)   Relevant Orders   CT CHEST LUNG CANCER SCREENING LOW DOSE WO CONTRAST     Return in about 6 months (around 08/26/2022) for health maintenance exam - see below .     Medication Samples have been provided to the patient.  Drug name: Nurtec       Strength: 67m        Qty: 4    LOT:[id number] Exp.Date: 10/2023  Dosing instructions: take 1 tablet po QD prn migraine headache   The patient has been instructed regarding the correct time, dose, and frequency of taking this medication, including desired effects and most  common side effects.   HRonnell Freshwater2:19 PM 04/01/2022     HRonnell Freshwater NP  [phone](phone) [phone](fax)  [place]

## 2022-04-01 DIAGNOSIS — Z87891 Personal history of nicotine dependence: Secondary | ICD-10-CM | POA: Insufficient documentation

## 2022-05-18 ENCOUNTER — Other Ambulatory Visit: Payer: Self-pay

## 2022-05-18 DIAGNOSIS — Z0000 Encounter for general adult medical examination without abnormal findings: Secondary | ICD-10-CM

## 2022-05-18 DIAGNOSIS — M064 Inflammatory polyarthropathy: Secondary | ICD-10-CM

## 2022-05-18 DIAGNOSIS — Z130 Encounter for screening for diseases of the blood and blood-forming organs and certain disorders involving the immune mechanism: Secondary | ICD-10-CM

## 2022-05-20 ENCOUNTER — Other Ambulatory Visit: Payer: BC Managed Care – PPO

## 2022-05-20 DIAGNOSIS — Z1321 Encounter for screening for nutritional disorder: Secondary | ICD-10-CM | POA: Diagnosis not present

## 2022-05-20 DIAGNOSIS — M064 Inflammatory polyarthropathy: Secondary | ICD-10-CM | POA: Diagnosis not present

## 2022-05-20 DIAGNOSIS — Z0000 Encounter for general adult medical examination without abnormal findings: Secondary | ICD-10-CM | POA: Diagnosis not present

## 2022-05-20 DIAGNOSIS — Z13228 Encounter for screening for other metabolic disorders: Secondary | ICD-10-CM | POA: Diagnosis not present

## 2022-05-20 DIAGNOSIS — Z130 Encounter for screening for diseases of the blood and blood-forming organs and certain disorders involving the immune mechanism: Secondary | ICD-10-CM

## 2022-05-20 DIAGNOSIS — Z1329 Encounter for screening for other suspected endocrine disorder: Secondary | ICD-10-CM | POA: Diagnosis not present

## 2022-05-21 LAB — CBC WITH DIFFERENTIAL/PLATELET
Basophils Absolute: 0.1 10*3/uL (ref 0.0–0.2)
Basos: 1 %
EOS (ABSOLUTE): 0.3 10*3/uL (ref 0.0–0.4)
Eos: 4 %
Hematocrit: 39 % (ref 37.5–51.0)
Hemoglobin: 13.6 g/dL (ref 13.0–17.7)
Immature Grans (Abs): 0 10*3/uL (ref 0.0–0.1)
Immature Granulocytes: 0 %
Lymphocytes Absolute: 1.8 10*3/uL (ref 0.7–3.1)
Lymphs: 32 %
MCH: 29.9 pg (ref 26.6–33.0)
MCHC: 34.9 g/dL (ref 31.5–35.7)
MCV: 86 fL (ref 79–97)
Monocytes Absolute: 0.5 10*3/uL (ref 0.1–0.9)
Monocytes: 8 %
Neutrophils Absolute: 3.1 10*3/uL (ref 1.4–7.0)
Neutrophils: 55 %
Platelets: 265 10*3/uL (ref 150–450)
RBC: 4.55 x10E6/uL (ref 4.14–5.80)
RDW: 12.7 % (ref 11.6–15.4)
WBC: 5.6 10*3/uL (ref 3.4–10.8)

## 2022-05-21 LAB — LIPID PANEL
Chol/HDL Ratio: 4.2 ratio (ref 0.0–5.0)
Cholesterol, Total: 182 mg/dL (ref 100–199)
HDL: 43 mg/dL (ref >39)
LDL Chol Calc (NIH): 112 mg/dL — ABNORMAL HIGH (ref 0–99)
Triglycerides: 152 mg/dL — ABNORMAL HIGH (ref 0–149)
VLDL Cholesterol Cal: 27 mg/dL (ref 5–40)

## 2022-05-21 LAB — HEMOGLOBIN A1C
Est. average glucose Bld gHb Est-mCnc: 103 mg/dL
Hgb A1c MFr Bld: 5.2 % (ref 4.8–5.6)

## 2022-05-21 LAB — URIC ACID: Uric Acid: 7.2 mg/dL (ref 3.8–8.4)

## 2022-05-21 LAB — ANA W/REFLEX IF POSITIVE: Anti Nuclear Antibody (ANA): NEGATIVE

## 2022-05-21 LAB — RHEUMATOID FACTOR: Rheumatoid fact SerPl-aCnc: <10 IU/mL (ref <14.0)

## 2022-05-21 LAB — SEDIMENTATION RATE: Sed Rate: 2 mm/hr (ref 0–30)

## 2022-05-21 LAB — TSH: TSH: 3.44 u[IU]/mL (ref 0.450–4.500)

## 2022-05-27 ENCOUNTER — Ambulatory Visit: Payer: BC Managed Care – PPO | Admitting: Nurse Practitioner

## 2022-05-27 ENCOUNTER — Encounter: Payer: Self-pay | Admitting: Nurse Practitioner

## 2022-05-27 VITALS — BP 125/78 | HR 61 | Ht 71.0 in | Wt 227.8 lb

## 2022-05-27 DIAGNOSIS — I10 Essential (primary) hypertension: Secondary | ICD-10-CM

## 2022-05-27 DIAGNOSIS — G43119 Migraine with aura, intractable, without status migrainosus: Secondary | ICD-10-CM | POA: Diagnosis not present

## 2022-05-27 DIAGNOSIS — E7800 Pure hypercholesterolemia, unspecified: Secondary | ICD-10-CM | POA: Diagnosis not present

## 2022-05-27 MED ORDER — TOPIRAMATE 25 MG PO TABS
25.0000 mg | ORAL_TABLET | Freq: Every day | ORAL | 3 refills | Status: DC
Start: 2022-05-27 — End: 2022-09-09

## 2022-05-27 NOTE — Progress Notes (Signed)
Established patient visit   Patient: Paul Schroeder   DOB: Jan 19, 1971   52 y.o. Male  MRN: [id number] Visit Date: 05/27/2022   Chief Complaint  Patient presents with   Medical Management of Chronic Issues   Subjective    HPI  Follow up  -hypertension  --generally well controlled  -migraine headache  -currently taking verapamil SR 120 mg twice daily  --in process of weaning off of this.  --takes imitrex and/or nurtec to relieve acute migraine headaches.  -labs drawn prior to this visit  --normal  HgbA1c, CBC, and TSH --mild elevation of LDL and triglycerides  -He denies chest pain, chest pressure, or shortness of breath. He denies abdominal pain, nausea, vomiting, or changes in bowel or bladder habits.     Medications: Outpatient Medications Prior to Visit  Medication Sig   BIOTIN 5000 PO Take by mouth.   finasteride (PROSCAR) 5 MG tablet Take 1/4 tablet by mouth daily   hydrochlorothiazide (HYDRODIURIL) 12.5 MG tablet Take 1 tablet (12.5 mg total) by mouth daily.   Multiple Vitamin (ONE-A-DAY ESSENTIAL PO) Take by mouth.   Probiotic Product (FORTIFY DAILY PROBIOTIC PO) Take by mouth.   Rimegepant Sulfate (NURTEC) 75 MG TBDP Take 1 tablet by mouth x 1 dose for acute headache. (Max 75 mg/day)   SUMAtriptan (IMITREX) 100 MG tablet TAKE 1 TABLET BY MOUTH AS  NEEDED FOR MIGRAINE. MAY  REPEAT IN 2 HOURS IF  HEADACHE PERSISTS OR RECURS   triamcinolone (NASACORT) 55 MCG/ACT AERO nasal inhaler Place 2 sprays into the nose at bedtime. Into each nostril   valsartan (DIOVAN) 160 MG tablet Take 1 tablet (160 mg total) by mouth daily.   verapamil (CALAN-SR) 120 MG CR tablet Take 1 tablet (120 mg total) by mouth 2 (two) times daily.   [DISCONTINUED] HYDROcodone-acetaminophen (NORCO/VICODIN) 5-325 MG tablet Take 1 tablet by mouth every 4 (four) hours as needed for moderate pain.   No facility-administered medications prior to visit.    Review of Systems See HPI    Last CBC Lab  Results  Component Value Date   WBC 5.6 05/20/2022   HGB 13.6 05/20/2022   HCT 39.0 05/20/2022   MCV 86 05/20/2022   MCH 29.9 05/20/2022   RDW 12.7 05/20/2022   PLT 265 05/20/2022   Last metabolic panel Lab Results  Component Value Date   GLUCOSE 99 10/06/2021   NA 143 10/06/2021   K 3.1 (L) 10/06/2021   CL 102 10/06/2021   CO2 22 07/23/2021   BUN 14 10/06/2021   CREATININE 0.60 (L) 10/06/2021   EGFR 108 07/23/2021   CALCIUM 8.8 07/23/2021   PHOS 2.4 (L) 08/19/2017   PROT 6.7 07/23/2021   ALBUMIN 4.5 07/23/2021   LABGLOB 2.2 07/23/2021   AGRATIO 2.0 07/23/2021   BILITOT 0.3 07/23/2021   ALKPHOS 86 07/23/2021   AST 13 07/23/2021   ALT 28 07/23/2021   Last lipids Lab Results  Component Value Date   CHOL 182 05/20/2022   HDL 43 05/20/2022   LDLCALC 112 (H) 05/20/2022   TRIG 152 (H) 05/20/2022   CHOLHDL 4.2 05/20/2022   Last hemoglobin A1c Lab Results  Component Value Date   HGBA1C 5.2 05/20/2022   Last thyroid functions Lab Results  Component Value Date   TSH 3.440 05/20/2022   T3TOTAL 111 03/02/2018   Last vitamin D Lab Results  Component Value Date   VD25OH 61.1 01/19/2021        Objective     Today's Vitals  05/27/22 1013  BP: 125/78  Pulse: 61  SpO2: 96%  Weight: 227 lb 12.8 oz (103.3 kg)  Height: 5\' 11"  (1.803 m)   Body mass index is 31.77 kg/m.  BP Readings from Last 3 Encounters:  05/27/22 125/78  02/25/22 119/76  10/06/21 103/78    Wt Readings from Last 3 Encounters:  05/27/22 227 lb 12.8 oz (103.3 kg)  02/25/22 222 lb (100.7 kg)  10/06/21 211 lb 3.2 oz (95.8 kg)    Physical Exam Vitals and nursing note reviewed.  Constitutional:      Appearance: Normal appearance. He is well-developed.  HENT:     Head: Normocephalic and atraumatic.     Nose: Nose normal.     Mouth/Throat:     Mouth: Mucous membranes are moist.     Pharynx: Oropharynx is clear.  Eyes:     Extraocular Movements: Extraocular movements intact.      Conjunctiva/sclera: Conjunctivae normal.     Pupils: Pupils are equal, round, and reactive to light.  Neck:     Vascular: No carotid bruit.  Cardiovascular:     Rate and Rhythm: Normal rate and regular rhythm.     Pulses: Normal pulses.     Heart sounds: Normal heart sounds.  Pulmonary:     Effort: Pulmonary effort is normal.     Breath sounds: Normal breath sounds.  Abdominal:     Palpations: Abdomen is soft.  Musculoskeletal:        General: Normal range of motion.     Cervical back: Normal range of motion and neck supple.  Lymphadenopathy:     Cervical: No cervical adenopathy.  Skin:    General: Skin is warm and dry.     Capillary Refill: Capillary refill takes less than 2 seconds.  Neurological:     General: No focal deficit present.     Mental Status: He is alert and oriented to person, place, and time.  Psychiatric:        Mood and Affect: Mood normal.        Behavior: Behavior normal.        Thought Content: Thought content normal.        Judgment: Judgment normal.      Assessment & Plan    Primary hypertension Assessment & Plan: -BP in office controlled. --continue valsartan and HCTZ as prescribed  -monitor closely    Intractable migraine with aura without status migrainosus Assessment & Plan: -Stable.  -add topamax 25 mg every evening to help reduce frequency and urgency of migraine headaches.  -continue abortive therapy as prescribed  -reassess at next visit   Orders: -     Topiramate; Take 1 tablet (25 mg total) by mouth at bedtime.  Dispense: 30 tablet; Refill: 3  Elevated LDL cholesterol level Assessment & Plan: Most recent lipid panel -   Lipid Panel     Component Value Date/Time   CHOL 182 05/20/2022 0822   TRIG 152 (H) 05/20/2022 0822   HDL 43 05/20/2022 0822   CHOLHDL 4.2 05/20/2022 0822   LDLCALC 112 (H) 05/20/2022 0822   LABVLDL 27 05/20/2022 0822    Recommend patient limit intake of fried and fatty foods. He should increase intake of  lean proteins and green leafy vegetables. Adding exercise into daily routine will also be beneficial.        Return for as scheduled.         Carlean Jews, NP  [facility] at [facility] [phone] (  phone) [phone] (fax)  [place]

## 2022-06-24 DIAGNOSIS — G43909 Migraine, unspecified, not intractable, without status migrainosus: Secondary | ICD-10-CM | POA: Insufficient documentation

## 2022-06-24 DIAGNOSIS — G43119 Migraine with aura, intractable, without status migrainosus: Secondary | ICD-10-CM | POA: Insufficient documentation

## 2022-06-24 NOTE — Assessment & Plan Note (Signed)
-  Stable.  -add topamax 25 mg every evening to help reduce frequency and urgency of migraine headaches.  -continue abortive therapy as prescribed  -reassess at next visit  

## 2022-06-24 NOTE — Assessment & Plan Note (Deleted)
-  Stable.  -add topamax 25 mg every evening to help reduce frequency and urgency of migraine headaches.  -continue abortive therapy as prescribed  -reassess at next visit

## 2022-06-24 NOTE — Assessment & Plan Note (Signed)
Most recent lipid panel -   Lipid Panel     Component Value Date/Time   CHOL 182 05/20/2022 0822   TRIG 152 (H) 05/20/2022 0822   HDL 43 05/20/2022 0822   CHOLHDL 4.2 05/20/2022 0822   LDLCALC 112 (H) 05/20/2022 0822   LABVLDL 27 05/20/2022 0822    Recommend patient limit intake of fried and fatty foods. He should increase intake of lean proteins and green leafy vegetables. Adding exercise into daily routine will also be beneficial.

## 2022-06-24 NOTE — Assessment & Plan Note (Signed)
-  BP in office controlled. --continue valsartan and HCTZ as prescribed  -monitor closely

## 2022-06-28 ENCOUNTER — Other Ambulatory Visit: Payer: Self-pay

## 2022-06-28 ENCOUNTER — Telehealth: Payer: Self-pay

## 2022-06-28 DIAGNOSIS — I10 Essential (primary) hypertension: Secondary | ICD-10-CM

## 2022-06-28 MED ORDER — HYDROCHLOROTHIAZIDE 12.5 MG PO TABS
12.5000 mg | ORAL_TABLET | Freq: Every day | ORAL | 0 refills | Status: DC
Start: 2022-06-28 — End: 2022-09-03

## 2022-06-28 NOTE — Telephone Encounter (Signed)
Requesting refill on: hydrochlorothiazide (HYDRODIURIL) 12.5 MG tablet   Pharmacy: short refill bc he is out: [facility] - [place], [place] - [address]   [facility] - [place], [place] - [address]

## 2022-06-28 NOTE — Telephone Encounter (Signed)
Rx has been sent to [facility] to last until his appt

## 2022-08-24 ENCOUNTER — Other Ambulatory Visit: Payer: Self-pay

## 2022-08-24 DIAGNOSIS — Z0000 Encounter for general adult medical examination without abnormal findings: Secondary | ICD-10-CM

## 2022-08-24 DIAGNOSIS — Z130 Encounter for screening for diseases of the blood and blood-forming organs and certain disorders involving the immune mechanism: Secondary | ICD-10-CM

## 2022-08-25 DIAGNOSIS — D225 Melanocytic nevi of trunk: Secondary | ICD-10-CM | POA: Diagnosis not present

## 2022-08-25 DIAGNOSIS — L821 Other seborrheic keratosis: Secondary | ICD-10-CM | POA: Diagnosis not present

## 2022-08-25 DIAGNOSIS — L905 Scar conditions and fibrosis of skin: Secondary | ICD-10-CM | POA: Diagnosis not present

## 2022-08-31 ENCOUNTER — Other Ambulatory Visit: Payer: BC Managed Care – PPO

## 2022-08-31 DIAGNOSIS — Z130 Encounter for screening for diseases of the blood and blood-forming organs and certain disorders involving the immune mechanism: Secondary | ICD-10-CM

## 2022-08-31 DIAGNOSIS — Z0000 Encounter for general adult medical examination without abnormal findings: Secondary | ICD-10-CM

## 2022-09-01 LAB — HEMOGLOBIN A1C
Est. average glucose Bld gHb Est-mCnc: 97 mg/dL
Hgb A1c MFr Bld: 5 % (ref 4.8–5.6)

## 2022-09-01 LAB — CBC WITH DIFFERENTIAL/PLATELET
Basophils Absolute: 0.1 10*3/uL (ref 0.0–0.2)
Basos: 1 %
EOS (ABSOLUTE): 0.3 10*3/uL (ref 0.0–0.4)
Eos: 4 %
Hematocrit: 40.3 % (ref 37.5–51.0)
Hemoglobin: 13.8 g/dL (ref 13.0–17.7)
Immature Grans (Abs): 0 10*3/uL (ref 0.0–0.1)
Immature Granulocytes: 1 %
Lymphocytes Absolute: 2.2 10*3/uL (ref 0.7–3.1)
Lymphs: 35 %
MCH: 30.7 pg (ref 26.6–33.0)
MCHC: 34.2 g/dL (ref 31.5–35.7)
MCV: 90 fL (ref 79–97)
Monocytes Absolute: 0.5 10*3/uL (ref 0.1–0.9)
Monocytes: 8 %
Neutrophils Absolute: 3.1 10*3/uL (ref 1.4–7.0)
Neutrophils: 51 %
Platelets: 267 10*3/uL (ref 150–450)
RBC: 4.5 x10E6/uL (ref 4.14–5.80)
RDW: 12.9 % (ref 11.6–15.4)
WBC: 6.2 10*3/uL (ref 3.4–10.8)

## 2022-09-01 LAB — LIPID PANEL
Chol/HDL Ratio: 4.3 ratio (ref 0.0–5.0)
Cholesterol, Total: 166 mg/dL (ref 100–199)
HDL: 39 mg/dL — ABNORMAL LOW (ref >39)
LDL Chol Calc (NIH): 106 mg/dL — ABNORMAL HIGH (ref 0–99)
Triglycerides: 115 mg/dL (ref 0–149)
VLDL Cholesterol Cal: 21 mg/dL (ref 5–40)

## 2022-09-01 LAB — COMPREHENSIVE METABOLIC PANEL
ALT: 26 IU/L (ref 0–44)
AST: 18 IU/L (ref 0–40)
Albumin: 4.4 g/dL (ref 3.8–4.9)
Alkaline Phosphatase: 75 IU/L (ref 44–121)
BUN/Creatinine Ratio: 13 (ref 9–20)
BUN: 13 mg/dL (ref 6–24)
Bilirubin Total: 0.3 mg/dL (ref 0.0–1.2)
CO2: 23 mmol/L (ref 20–29)
Calcium: 9.4 mg/dL (ref 8.7–10.2)
Chloride: 107 mmol/L — ABNORMAL HIGH (ref 96–106)
Creatinine, Ser: 1 mg/dL (ref 0.76–1.27)
Globulin, Total: 2 g/dL (ref 1.5–4.5)
Glucose: 89 mg/dL (ref 70–99)
Potassium: 4 mmol/L (ref 3.5–5.2)
Sodium: 147 mmol/L — ABNORMAL HIGH (ref 134–144)
Total Protein: 6.4 g/dL (ref 6.0–8.5)
eGFR: 91 mL/min/{1.73_m2} (ref >59)

## 2022-09-01 LAB — TSH: TSH: 3.58 u[IU]/mL (ref 0.450–4.500)

## 2022-09-03 ENCOUNTER — Other Ambulatory Visit: Payer: Self-pay | Admitting: Nurse Practitioner

## 2022-09-03 DIAGNOSIS — I10 Essential (primary) hypertension: Secondary | ICD-10-CM

## 2022-09-07 ENCOUNTER — Encounter: Payer: BC Managed Care – PPO | Admitting: Nurse Practitioner

## 2022-09-09 ENCOUNTER — Encounter: Payer: Self-pay | Admitting: Family Medicine

## 2022-09-09 ENCOUNTER — Ambulatory Visit (INDEPENDENT_AMBULATORY_CARE_PROVIDER_SITE_OTHER): Payer: BC Managed Care – PPO | Admitting: Family Medicine

## 2022-09-09 VITALS — BP 127/83 | HR 70 | Ht 71.0 in | Wt 220.7 lb

## 2022-09-09 DIAGNOSIS — F17200 Nicotine dependence, unspecified, uncomplicated: Secondary | ICD-10-CM

## 2022-09-09 DIAGNOSIS — E7800 Pure hypercholesterolemia, unspecified: Secondary | ICD-10-CM | POA: Diagnosis not present

## 2022-09-09 DIAGNOSIS — L649 Androgenic alopecia, unspecified: Secondary | ICD-10-CM

## 2022-09-09 DIAGNOSIS — I10 Essential (primary) hypertension: Secondary | ICD-10-CM | POA: Diagnosis not present

## 2022-09-09 DIAGNOSIS — G43119 Migraine with aura, intractable, without status migrainosus: Secondary | ICD-10-CM

## 2022-09-09 DIAGNOSIS — Z87891 Personal history of nicotine dependence: Secondary | ICD-10-CM

## 2022-09-09 DIAGNOSIS — G43009 Migraine without aura, not intractable, without status migrainosus: Secondary | ICD-10-CM

## 2022-09-09 DIAGNOSIS — G43809 Other migraine, not intractable, without status migrainosus: Secondary | ICD-10-CM

## 2022-09-09 DIAGNOSIS — G43101 Migraine with aura, not intractable, with status migrainosus: Secondary | ICD-10-CM

## 2022-09-09 MED ORDER — HYDROCHLOROTHIAZIDE 12.5 MG PO TABS
12.5000 mg | ORAL_TABLET | Freq: Every day | ORAL | 3 refills | Status: DC
Start: 2022-09-09 — End: 2023-03-14

## 2022-09-09 MED ORDER — TOPIRAMATE 25 MG PO TABS
25.0000 mg | ORAL_TABLET | Freq: Every day | ORAL | 3 refills | Status: DC
Start: 2022-09-09 — End: 2023-08-15

## 2022-09-09 MED ORDER — FINASTERIDE 5 MG PO TABS
ORAL_TABLET | ORAL | 3 refills | Status: DC
Start: 2022-09-09 — End: 2023-08-15

## 2022-09-09 MED ORDER — VERAPAMIL HCL ER 120 MG PO TBCR
120.0000 mg | EXTENDED_RELEASE_TABLET | Freq: Two times a day (BID) | ORAL | 3 refills | Status: DC
Start: 2022-09-09 — End: 2023-08-25

## 2022-09-09 MED ORDER — SUMATRIPTAN SUCCINATE 100 MG PO TABS
ORAL_TABLET | ORAL | 3 refills | Status: DC
Start: 2022-09-09 — End: 2023-02-17

## 2022-09-09 MED ORDER — VALSARTAN 160 MG PO TABS
160.0000 mg | ORAL_TABLET | Freq: Every day | ORAL | 3 refills | Status: DC
Start: 2022-09-09 — End: 2023-08-25

## 2022-09-09 NOTE — Assessment & Plan Note (Signed)
Continue Topamax.  Nurtec did not help.  Imitrex for acute treatment.

## 2022-09-09 NOTE — Assessment & Plan Note (Signed)
No complaints.  Continue finasteride.

## 2022-09-09 NOTE — Patient Instructions (Signed)
It was nice to see you today,  We addressed the following topics today: -I will send in your prescriptions to [facility] for 90 pills - We will send in a referral for low-dose CT scan.  Someone will call you to schedule this - No changes to your current medications.  Please follow-up with me and 6 months.  Have a great day,  Frederic Jericho, MD

## 2022-09-09 NOTE — Assessment & Plan Note (Signed)
Continue verapamil, HCTZ, ARB.  Blood pressure good today.

## 2022-09-09 NOTE — Assessment & Plan Note (Signed)
Low-dose CT scan ordered.  Will need this yearly.

## 2022-09-09 NOTE — Assessment & Plan Note (Signed)
ASCVD risk less than 5%.  Counseled on weight loss, dietary changes.  Patient trying to lose weight currently.

## 2022-09-09 NOTE — Progress Notes (Signed)
   Established Patient Office Visit  Subjective   Patient ID: Paul Schroeder, male    DOB: [birth year]  Age: 52 y.o. MRN: [id number]  Chief Complaint  Patient presents with   Annual Exam    HPI Patient here today for physical.  He has no complaints with his current medications.  For migraines he did not find that the Nurtec helped.  He did feel like that Topamax helped.  Still tries to use Imitrex for breakthrough headaches but sometimes has difficulty getting this filled.  We discussed getting a low-dose CT scan to check for lung cancer since he was a smoker.  We went through the patient's medication list and made sure it was up-to-date.  Patient compliant with all medications listed.  Will send in 90-day refills.  Patient is married, male partner.  No children.  Works as a [employer]ar[employer] manager.  Not currently sexually active.  Declines STI testing.  The 10-year ASCVD risk score (Arnett DK, et al., 2019) is: 4.9%     ROS    Objective:     BP 127/83   Pulse 70   Ht 5\' 11"  (1.803 m)   Wt 220 lb 11.2 oz (100.1 kg)   SpO2 96%   BMI 30.78 kg/m    Physical Exam General: Alert and oriented. HEENT: PERRLA, EOMI, normal TM CV: Regular rate and rhythm Pulmonary: Lungs clear bilaterally MSK: Strength equal bilaterally, Psych: Pleasant affect   No results found for any visits on 09/09/22.        Assessment & Plan:   Tobacco use disorder- approximately 28-pack-year history- NOW quit even vaping! -     CT CHEST LUNG CANCER SCREENING LOW DOSE WO CONTRAST; Future  Male pattern baldness Assessment & Plan: No complaints.  Continue finasteride.  Orders: -     Finasteride; Take 1/4 tablet by mouth daily  Dispense: 45 tablet; Refill: 3  Hypertension, unspecified type Assessment & Plan: Continue verapamil, HCTZ, ARB.  Blood pressure good today.  Orders: -     hydroCHLOROthiazide; Take 1 tablet (12.5 mg total) by mouth daily.  Dispense: 90 tablet; Refill:  3  Essential hypertension -     Valsartan; Take 1 tablet (160 mg total) by mouth daily.  Dispense: 90 tablet; Refill: 3   Elevated LDL cholesterol level Assessment & Plan: ASCVD risk less than 5%.  Counseled on weight loss, dietary changes.  Patient trying to lose weight currently.   Former heavy cigarette smoker (20-39 per day) Assessment & Plan: Low-dose CT scan ordered.  Will need this yearly.   Migraine without aura and without status migrainosus, not intractable Assessment & Plan: Continue Topamax.  Nurtec did not help.  Imitrex for acute treatment.      Return in about 6 months (around 03/12/2023) for HTN.    Sandre Kitty, MD

## 2022-09-16 ENCOUNTER — Ambulatory Visit
Admission: RE | Admit: 2022-09-16 | Discharge: 2022-09-16 | Disposition: A | Discharge status: Home / Self Care | Payer: BC Managed Care – PPO | Source: Ambulatory Visit | Attending: Family Medicine | Admitting: Family Medicine

## 2022-09-16 DIAGNOSIS — F17200 Nicotine dependence, unspecified, uncomplicated: Secondary | ICD-10-CM

## 2022-09-16 DIAGNOSIS — Z87891 Personal history of nicotine dependence: Secondary | ICD-10-CM | POA: Diagnosis not present

## 2022-09-27 ENCOUNTER — Other Ambulatory Visit: Payer: Self-pay | Admitting: Nurse Practitioner

## 2022-09-27 DIAGNOSIS — G43009 Migraine without aura, not intractable, without status migrainosus: Secondary | ICD-10-CM

## 2022-10-06 ENCOUNTER — Other Ambulatory Visit: Payer: Self-pay | Admitting: Nurse Practitioner

## 2022-10-06 DIAGNOSIS — G43009 Migraine without aura, not intractable, without status migrainosus: Secondary | ICD-10-CM

## 2022-10-08 ENCOUNTER — Telehealth: Payer: Self-pay | Admitting: *Deleted

## 2022-10-08 NOTE — Telephone Encounter (Signed)
Error

## 2022-10-12 DIAGNOSIS — H43312 Vitreous membranes and strands, left eye: Secondary | ICD-10-CM | POA: Diagnosis not present

## 2023-02-17 ENCOUNTER — Other Ambulatory Visit: Payer: Self-pay | Admitting: *Deleted

## 2023-02-17 DIAGNOSIS — G43009 Migraine without aura, not intractable, without status migrainosus: Secondary | ICD-10-CM

## 2023-02-17 NOTE — Telephone Encounter (Signed)
 Routing to provider to see about refill, pt has appt in February.

## 2023-02-18 MED ORDER — SUMATRIPTAN SUCCINATE 100 MG PO TABS
ORAL_TABLET | ORAL | 3 refills | Status: DC
Start: 2023-02-18 — End: 2023-06-21

## 2023-03-14 ENCOUNTER — Ambulatory Visit: Payer: BC Managed Care – PPO | Admitting: Family Medicine

## 2023-03-14 ENCOUNTER — Encounter: Payer: Self-pay | Admitting: Family Medicine

## 2023-03-14 VITALS — BP 116/74 | HR 83 | Ht 71.0 in | Wt 228.4 lb

## 2023-03-14 DIAGNOSIS — R062 Wheezing: Secondary | ICD-10-CM | POA: Diagnosis not present

## 2023-03-14 DIAGNOSIS — I10 Essential (primary) hypertension: Secondary | ICD-10-CM | POA: Diagnosis not present

## 2023-03-14 DIAGNOSIS — G43009 Migraine without aura, not intractable, without status migrainosus: Secondary | ICD-10-CM | POA: Diagnosis not present

## 2023-03-14 MED ORDER — EPINEPHRINE 0.3 MG/0.3ML IJ SOAJ: 0.3000 mg | INTRAMUSCULAR | 1 refills | Status: DC | PRN

## 2023-03-14 MED ORDER — ALBUTEROL SULFATE HFA 108 (90 BASE) MCG/ACT IN AERS: 2.0000 | INHALATION_SPRAY | RESPIRATORY_TRACT | 3 refills | Status: DC | PRN

## 2023-03-14 MED ORDER — HYDROCHLOROTHIAZIDE 25 MG PO TABS
25.0000 mg | ORAL_TABLET | Freq: Every day | ORAL | 1 refills | Status: DC
Start: 2023-03-14 — End: 2023-06-21

## 2023-03-14 NOTE — Assessment & Plan Note (Signed)
Complains of wheezing triggered by heat or activity.  Never diagnosed with asthma.  Does have a personal history of seasonal allergies and a family history of asthma.  Has been using something called Primatene Mist over-the-counter.  Also used his mother's albuterol inhaler and states he immediately felt relief with this.  Will send in albuterol to use as needed.  Recommended he try using it before doing anything that involves aerobic activity that could trigger his asthma

## 2023-03-14 NOTE — Assessment & Plan Note (Signed)
Patient states at home it has been in the 130s to 140s systolic.  Will increase his hydrochlorothiazide 12.5 to 25 mg daily.  Advised him to check values at home and let us know if these are not improved

## 2023-03-14 NOTE — Patient Instructions (Addendum)
It was nice to see you today,  We addressed the following topics today: -I have increased your hydrochlorothiazide from 12.5 to 25 mg.  You can double up on your current dose until you get the new prescription - I have sent in a prescription for albuterol for as needed use for asthma.  If you know you are going to do something like exercise that could trigger an asthma attack you can take this 15 minutes prior to exercise - I have sent in an EpiPen for you or your allergy to bees. - Check your blood pressure regularly at home and if it is still too high (130s to 140s systolic on average) let us know and we can change your medications.  Have a great day,  Frederic Jericho, MD

## 2023-03-14 NOTE — Progress Notes (Unsigned)
   Established Patient Office Visit  Subjective   Patient ID: Paul Schroeder, male    DOB: 1970/09/16  Age: 53 y.o. MRN: [id number]  Chief Complaint  Patient presents with   Medical Management of Chronic Issues    HPI  HTN -patient taking valsartan hydrochlorothiazide and verapamil.  States that his blood pressure is actually little bit higher at home than it was today.  Usually he states he is in the 130s to 140s range.  We discussed increasing the hydrochlorothiazide to 25 mg.  Patient agreeable to this.  Patient has episodes where he has wheezing and coughing, usually triggered by heat, but sometimes by things like laughing or exercise/activity.  Patient has a history of seasonal allergies and a family history of asthma.  Migraines-patient takes Topamax and Imitrex.  Has not had to take the Imitrex 2 times in the past month due to the improvement since starting Topamax.    The 10-year ASCVD risk score (Arnett DK, et al., 2019) is: 4.2%  Health Maintenance Due  Topic Date Due   Hepatitis C Screening  Never done   INFLUENZA VACCINE  09/09/2022   COVID-19 Vaccine (6 - 2024-25 season) 10/10/2022      Objective:     BP 116/74   Pulse 83   Ht 5\' 11"  (1.803 m)   Wt 228 lb 6.4 oz (103.6 kg)   SpO2 96%   BMI 31.86 kg/m  {Vitals History (Optional):23777}  Physical Exam General: Alert, oriented CV: Regular rate and rhythm Pulmonary: Lungs clear bilaterally Psych: Pleasant affect   No results found for any visits on 03/14/23.      Assessment & Plan:   Primary hypertension Assessment & Plan: Patient states at home it has been in the 130s to 140s systolic.  Will increase his hydrochlorothiazide 12.5 to 25 mg daily.  Advised him to check values at home and let us know if these are not improved   Essential hypertension -     hydroCHLOROthiazide; Take 1 tablet (25 mg total) by mouth daily.  Dispense: 90 tablet; Refill: 1  Migraine without aura and without status  migrainosus, not intractable Assessment & Plan: Patient has much improved control of migraines since starting Topamax.  Continue Topamax and Imitrex as needed.    Wheezing Assessment & Plan: Complains of wheezing triggered by heat or activity.  Never diagnosed with asthma.  Does have a personal history of seasonal allergies and a family history of asthma.  Has been using something called Primatene Mist over-the-counter.  Also used his mother's albuterol inhaler and states he immediately felt relief with this.  Will send in albuterol to use as needed.  Recommended he try using it before doing anything that involves aerobic activity that could trigger his asthma   Other orders -     EPINEPHrine; Inject 0.3 mg into the muscle as needed for anaphylaxis.  Dispense: 1 each; Refill: 1 -     Albuterol Sulfate HFA; Inhale 2 puffs into the lungs every 4 (four) hours as needed for wheezing or shortness of breath.  Dispense: 1 each; Refill: 3     Return in about 6 months (around 09/11/2023) for physical.    Sandre Kitty, MD

## 2023-03-14 NOTE — Assessment & Plan Note (Signed)
Patient has much improved control of migraines since starting Topamax.  Continue Topamax and Imitrex as needed.

## 2023-04-18 ENCOUNTER — Ambulatory Visit: Payer: Self-pay | Admitting: Family Medicine

## 2023-04-18 NOTE — Telephone Encounter (Signed)
 Patient called, left VM to return the call to the office to speak to NT.    Copied From CRM [id number]. Reason for Triage: cough, congestion, phelgm - been about a week.    Pt callback [phone]

## 2023-04-18 NOTE — Telephone Encounter (Signed)
  Chief Complaint: sinus congestion Symptoms: sore throat. Runny nose Frequency: comes and goes   Disposition: [] ED /[] Urgent Care (no appt availability in office) / [x] Appointment(In office/virtual)/ []  [facility]/ [] Home Care/ [] Refused Recommended Disposition /[] [facility]/ []  Follow-up with PCP Additional Notes: Pt has had sinus pressure/sore throat for roughly a week.  Pt feels pressure around nose and under eyes. Pt's mucus is clear in color, but did have blood present last week. Pt denies fever. Pt has appt 3/12 @ 1550. RN gave care advice and pt verbalized understanding.                Copied from CRM [id number]. Topic: Clinical - Pink Word Triage >> Apr 18, 2023  1:39 PM Ivette P wrote: Reason for Triage: cough, congestion, phelgm - been about a week.    Pt callback [phone] Reason for Disposition  [1] Sinus congestion (pressure, fullness) AND [2] present > 10 days  Answer Assessment - Initial Assessment Questions 1. LOCATION: "Where does it hurt?"      Around nose and under eyes  2. ONSET: "When did the sinus pain start?"  (e.g., hours, days)      Week ago  3. SEVERITY: "How bad is the pain?"   (Scale 1-10; mild, moderate or severe)   - MILD (1-3): doesn't interfere with normal activities    - MODERATE (4-7): interferes with normal activities (e.g., work or school) or awakens from sleep   - SEVERE (8-10): excruciating pain and patient unable to do any normal activities        Mild  4. RECURRENT SYMPTOM: "Have you ever had sinus problems before?" If Yes, ask: "When was the last time?" and "What happened that time?"      Yes  5. NASAL CONGESTION: "Is the nose blocked?" If Yes, ask: "Can you open it or must you breathe through your mouth?"     Currently blocked  6. NASAL DISCHARGE: "Do you have discharge from your nose?" If so ask, "What color?"     clear 7. FEVER: "Do you have a fever?" If Yes, ask: "What is it, how was it measured, and  when did it start?"      Denies  8. OTHER SYMPTOMS: "Do you have any other symptoms?" (e.g., sore throat, cough, earache, difficulty breathing)     Sore throat  Protocols used: Sinus Pain or Congestion-A-AH

## 2023-04-20 ENCOUNTER — Ambulatory Visit: Admitting: Family Medicine

## 2023-04-20 VITALS — BP 125/79 | HR 77 | Ht 71.0 in | Wt 227.0 lb

## 2023-04-20 DIAGNOSIS — J329 Chronic sinusitis, unspecified: Secondary | ICD-10-CM

## 2023-04-20 MED ORDER — AMOXICILLIN-POT CLAVULANATE 875-125 MG PO TABS: 1.0000 | ORAL_TABLET | Freq: Two times a day (BID) | ORAL | 0 refills | Status: AC

## 2023-04-20 NOTE — Assessment & Plan Note (Signed)
 Has a history of seasonal allergies.  Is taking several allergy medicines right now.  Symptoms are worse over the past week.  Given that he is already on allergy medications, will prescribe antibiotics: Augmentin x 7 days.  Advised him if antibiotics do not improve his symptoms to let us know and we can refer to ENT whom he has not seen in several years.

## 2023-04-20 NOTE — Patient Instructions (Signed)
 It was nice to see you today,  We addressed the following topics today: -I am sending in 7 days of Augmentin to take twice a day. - If at the end of the 7 days you do not feel like you are any better let us know and I can send in a referral to the ear nose and throat doctor. - Continue with the other allergy medicines you are already using as well as the nasal decongestants for symptom relief.  Have a great day,  Frederic Jericho, MD

## 2023-04-20 NOTE — Progress Notes (Signed)
   Acute Office Visit  Subjective:     Patient ID: Paul Schroeder, male    DOB: 1970/04/17, 53 y.o.   MRN: [id number]  Chief Complaint  Patient presents with   Sinus Problem    HPI Patient is in today for sinus congestion.  He has seasonal allergies for which he takes over-the-counter medications and prescription medications: Allegra,, Xyzal, Nasacort, Singulair.  Over the past week the right side has been worse with feeling like his face is swollen in the morning on the right side.  Improves throughout the day.  Has persistent congestion, rhinorrhea, sinus pressure, itchy eyes and sore throat..  Recently went to the dentist and had x-rays of his teeth and was told he had something like a cyst in his sinuses.  Has not seen a ear nose and throat doctor for several years.    ROS      Objective:    BP 125/79   Pulse 77   Ht 5\' 11"  (1.803 m)   Wt 227 lb (103 kg)   SpO2 96%   BMI 31.66 kg/m    Physical Exam  General: Alert, oriented HEENT: PERRLA, EOMI, bilateral enlarged turbinates. Neck: No lymphadenopathy. Pulmonary: No respiratory distress  No results found for any visits on 04/20/23.      Assessment & Plan:   Rhinosinusitis Assessment & Plan: Has a history of seasonal allergies.  Is taking several allergy medicines right now.  Symptoms are worse over the past week.  Given that he is already on allergy medications, will prescribe antibiotics: Augmentin x 7 days.  Advised him if antibiotics do not improve his symptoms to let us know and we can refer to ENT whom he has not seen in several years.   Other orders -     Amoxicillin-Pot Clavulanate; Take 1 tablet by mouth 2 (two) times daily for 7 days.  Dispense: 14 tablet; Refill: 0     Return if symptoms worsen or fail to improve.  Sandre Kitty, MD

## 2023-06-19 ENCOUNTER — Other Ambulatory Visit: Payer: Self-pay | Admitting: Family Medicine

## 2023-06-19 DIAGNOSIS — I10 Essential (primary) hypertension: Secondary | ICD-10-CM

## 2023-06-19 DIAGNOSIS — G43009 Migraine without aura, not intractable, without status migrainosus: Secondary | ICD-10-CM

## 2023-08-14 ENCOUNTER — Other Ambulatory Visit: Payer: Self-pay | Admitting: Family Medicine

## 2023-08-14 DIAGNOSIS — L649 Androgenic alopecia, unspecified: Secondary | ICD-10-CM

## 2023-08-14 DIAGNOSIS — G43009 Migraine without aura, not intractable, without status migrainosus: Secondary | ICD-10-CM

## 2023-08-24 ENCOUNTER — Other Ambulatory Visit: Payer: Self-pay | Admitting: Family Medicine

## 2023-08-24 DIAGNOSIS — Z1283 Encounter for screening for malignant neoplasm of skin: Secondary | ICD-10-CM | POA: Diagnosis not present

## 2023-08-24 DIAGNOSIS — L905 Scar conditions and fibrosis of skin: Secondary | ICD-10-CM | POA: Diagnosis not present

## 2023-08-24 DIAGNOSIS — I10 Essential (primary) hypertension: Secondary | ICD-10-CM

## 2023-08-24 DIAGNOSIS — L820 Inflamed seborrheic keratosis: Secondary | ICD-10-CM | POA: Diagnosis not present

## 2023-08-24 DIAGNOSIS — D225 Melanocytic nevi of trunk: Secondary | ICD-10-CM | POA: Diagnosis not present

## 2023-09-05 ENCOUNTER — Other Ambulatory Visit: Payer: Self-pay | Admitting: *Deleted

## 2023-09-05 DIAGNOSIS — Z131 Encounter for screening for diabetes mellitus: Secondary | ICD-10-CM

## 2023-09-05 DIAGNOSIS — I10 Essential (primary) hypertension: Secondary | ICD-10-CM

## 2023-09-05 DIAGNOSIS — E7800 Pure hypercholesterolemia, unspecified: Secondary | ICD-10-CM

## 2023-09-06 ENCOUNTER — Other Ambulatory Visit: Payer: BC Managed Care – PPO

## 2023-09-06 DIAGNOSIS — Z131 Encounter for screening for diabetes mellitus: Secondary | ICD-10-CM

## 2023-09-06 DIAGNOSIS — E7800 Pure hypercholesterolemia, unspecified: Secondary | ICD-10-CM

## 2023-09-06 DIAGNOSIS — I10 Essential (primary) hypertension: Secondary | ICD-10-CM

## 2023-09-07 ENCOUNTER — Ambulatory Visit: Payer: Self-pay

## 2023-09-07 LAB — CBC WITH DIFFERENTIAL/PLATELET
Basophils Absolute: 0 x10E3/uL (ref 0.0–0.2)
Basos: 1 %
EOS (ABSOLUTE): 0.2 x10E3/uL (ref 0.0–0.4)
Eos: 4 %
Hematocrit: 41.3 % (ref 37.5–51.0)
Hemoglobin: 14.1 g/dL (ref 13.0–17.7)
Immature Grans (Abs): 0 x10E3/uL (ref 0.0–0.1)
Immature Granulocytes: 0 %
Lymphocytes Absolute: 1.8 x10E3/uL (ref 0.7–3.1)
Lymphs: 34 %
MCH: 30.4 pg (ref 26.6–33.0)
MCHC: 34.1 g/dL (ref 31.5–35.7)
MCV: 89 fL (ref 79–97)
Monocytes Absolute: 0.4 x10E3/uL (ref 0.1–0.9)
Monocytes: 8 %
Neutrophils Absolute: 2.8 x10E3/uL (ref 1.4–7.0)
Neutrophils: 53 %
Platelets: 256 x10E3/uL (ref 150–450)
RBC: 4.64 x10E6/uL (ref 4.14–5.80)
RDW: 13.2 % (ref 11.6–15.4)
WBC: 5.2 x10E3/uL (ref 3.4–10.8)

## 2023-09-07 LAB — COMPREHENSIVE METABOLIC PANEL WITH GFR
ALT: 23 IU/L (ref 0–44)
AST: 17 IU/L (ref 0–40)
Albumin: 4.5 g/dL (ref 3.8–4.9)
Alkaline Phosphatase: 72 IU/L (ref 44–121)
BUN/Creatinine Ratio: 18 (ref 9–20)
BUN: 17 mg/dL (ref 6–24)
Bilirubin Total: 0.4 mg/dL (ref 0.0–1.2)
CO2: 22 mmol/L (ref 20–29)
Calcium: 9.4 mg/dL (ref 8.7–10.2)
Chloride: 103 mmol/L (ref 96–106)
Creatinine, Ser: 0.93 mg/dL (ref 0.76–1.27)
Globulin, Total: 2.4 g/dL (ref 1.5–4.5)
Glucose: 91 mg/dL (ref 70–99)
Potassium: 3.3 mmol/L — ABNORMAL LOW (ref 3.5–5.2)
Sodium: 143 mmol/L (ref 134–144)
Total Protein: 6.9 g/dL (ref 6.0–8.5)
eGFR: 98 mL/min/1.73 (ref >59)

## 2023-09-07 LAB — HEMOGLOBIN A1C
Est. average glucose Bld gHb Est-mCnc: 91 mg/dL
Hgb A1c MFr Bld: 4.8 % (ref 4.8–5.6)

## 2023-09-07 LAB — TSH: TSH: 3.28 u[IU]/mL (ref 0.450–4.500)

## 2023-09-07 LAB — LIPID PANEL
Chol/HDL Ratio: 4.5 ratio (ref 0.0–5.0)
Cholesterol, Total: 198 mg/dL (ref 100–199)
HDL: 44 mg/dL (ref >39)
LDL Chol Calc (NIH): 134 mg/dL — ABNORMAL HIGH (ref 0–99)
Triglycerides: 109 mg/dL (ref 0–149)
VLDL Cholesterol Cal: 20 mg/dL (ref 5–40)

## 2023-09-12 ENCOUNTER — Ambulatory Visit (INDEPENDENT_AMBULATORY_CARE_PROVIDER_SITE_OTHER): Payer: BC Managed Care – PPO | Admitting: Family Medicine

## 2023-09-12 ENCOUNTER — Encounter: Payer: Self-pay | Admitting: Family Medicine

## 2023-09-12 VITALS — BP 132/86 | HR 69 | Ht 71.0 in | Wt 213.9 lb

## 2023-09-12 DIAGNOSIS — F17200 Nicotine dependence, unspecified, uncomplicated: Secondary | ICD-10-CM

## 2023-09-12 DIAGNOSIS — L649 Androgenic alopecia, unspecified: Secondary | ICD-10-CM

## 2023-09-12 DIAGNOSIS — Z23 Encounter for immunization: Secondary | ICD-10-CM

## 2023-09-12 DIAGNOSIS — I10 Essential (primary) hypertension: Secondary | ICD-10-CM | POA: Diagnosis not present

## 2023-09-12 DIAGNOSIS — E876 Hypokalemia: Secondary | ICD-10-CM

## 2023-09-12 DIAGNOSIS — E7800 Pure hypercholesterolemia, unspecified: Secondary | ICD-10-CM

## 2023-09-12 DIAGNOSIS — Z0000 Encounter for general adult medical examination without abnormal findings: Secondary | ICD-10-CM | POA: Diagnosis not present

## 2023-09-12 DIAGNOSIS — G43009 Migraine without aura, not intractable, without status migrainosus: Secondary | ICD-10-CM | POA: Diagnosis not present

## 2023-09-12 MED ORDER — VALSARTAN 160 MG PO TABS: 160.0000 mg | ORAL_TABLET | Freq: Every day | ORAL | 3 refills | Status: DC

## 2023-09-12 MED ORDER — FINASTERIDE 5 MG PO TABS: ORAL_TABLET | ORAL | 3 refills | Status: AC

## 2023-09-12 MED ORDER — POTASSIUM CHLORIDE CRYS ER 20 MEQ PO TBCR: 20.0000 meq | EXTENDED_RELEASE_TABLET | Freq: Every day | ORAL | 3 refills | Status: DC

## 2023-09-12 MED ORDER — VERAPAMIL HCL ER 120 MG PO TBCR: 120.0000 mg | EXTENDED_RELEASE_TABLET | Freq: Two times a day (BID) | ORAL | 3 refills | Status: AC

## 2023-09-12 MED ORDER — HYDROCHLOROTHIAZIDE 25 MG PO TABS: 25.0000 mg | ORAL_TABLET | Freq: Every day | ORAL | 3 refills | Status: DC

## 2023-09-12 MED ORDER — TOPIRAMATE 25 MG PO TABS: 25.0000 mg | ORAL_TABLET | Freq: Two times a day (BID) | ORAL | 3 refills | Status: AC

## 2023-09-12 NOTE — Assessment & Plan Note (Signed)
-   Lab work showed slightly low potassium. Has been taking an OTC potassium supplement. - Discussed the role of magnesium in potassium regulation. - Plan:     - Advised to start an over-the-counter magnesium supplement at night. Counselled on benefits for muscle cramps and sleep.     - Will send a prescription for potassium supplement.     - Will re-evaluate need for potassium dose increase after starting magnesium.

## 2023-09-12 NOTE — Assessment & Plan Note (Signed)
-   Stable on current regimen of valsartan  and hydrochlorothiazide . - Will send prescription refills. - Continue current treatment.

## 2023-09-12 NOTE — Progress Notes (Unsigned)
 Established Patient Office Visit  Subjective   Patient ID: Paul Schroeder, male    DOB: 1970-05-30  Age: 53 y.o. MRN: [id number]  Chief Complaint  Patient presents with   Annual Exam    HPI  Subjective - Follow-up for medication management. No new issues or concerns reported. - Reports continued use of a low-carb, higher-fat diet for the past couple of months, with a weight loss of approximately 15 pounds. This is the only diet they have been able to adhere to and see results with. - Reports quitting smoking.  Medications Current medications include valsartan , hydrochlorothiazide , finasteride , and topiramate . Sumatriptan  is taken as needed for migraines. Taking an over-the-counter potassium supplement ([employer] brand, dose unknown) and a daily multivitamin.  PMH, PSH, FH, Social Hx PMHx: Hypertension, migraines, history of smoking (quit), hypercholesterolemia. Social Hx: Quit smoking.  ROS Recent lab results showed slightly low potassium and elevated cholesterol. No other issues reported.    The 10-year ASCVD risk score (Arnett DK, et al., 2019) is: 6.4%  Health Maintenance Due  Topic Date Due   Hepatitis C Screening  Never done   Hepatitis B Vaccines (1 of 3 - 19+ 3-dose series) Never done   Pneumococcal Vaccine: 50+ Years (1 of 1 - PCV) Never done   COVID-19 Vaccine (6 - 2024-25 season) 10/10/2022   INFLUENZA VACCINE  09/09/2023   Lung Cancer Screening  09/16/2023      Objective:     BP 132/86   Pulse 69   Ht 5' 11 (1.803 m)   Wt 213 lb 14.4 oz (97 kg)   SpO2 96%   BMI 29.83 kg/m  {Vitals History (Optional):23777}  Physical Exam Gen: alert, oriented Heent: perrla, eomi.  Pupils 1mm b/l.  Cv: rrr Pulm: lctab Gi: soft, nbs.  Msk: strength equal. Normal gait.  Psych: pleasant affect Skin: warm and dry   No results found for any visits on 09/12/23.      Assessment & Plan:   Primary hypertension Assessment & Plan: - Stable on  current regimen of valsartan  and hydrochlorothiazide . - Will send prescription refills. - Continue current treatment.   Male pattern baldness -     Finasteride ; TAKE ONE-FOURTH (1/4) TABLET BY  MOUTH DAILY  Dispense: 23 tablet; Refill: 3  Essential hypertension -     hydroCHLOROthiazide ; Take 1 tablet (25 mg total) by mouth daily.  Dispense: 90 tablet; Refill: 3 -     Valsartan ; Take 1 tablet (160 mg total) by mouth daily.  Dispense: 90 tablet; Refill: 3 -     Verapamil  HCl ER; Take 1 tablet (120 mg total) by mouth 2 (two) times daily.  Dispense: 180 tablet; Refill: 3  Migraine without aura and without status migrainosus, not intractable Assessment & Plan: - Taking topiramate  25mg  at night, which has reduced the frequency of migraines but they last longer when they occur. - Uses sumatriptan  as needed, approximately twice a month, sometimes for multiple consecutive days during severe episodes. - Discussed options to improve migraine control. - Plan:     - Increase topiramate  to 25mg  twice daily (one tablet in the morning, one at night). Counselled that if daytime side effects are not tolerated, can take two tablets (50mg ) at bedtime. Prescription will be sent for twice daily dosing.     - Refill sumatriptan .  Orders: -     Topiramate ; Take 1 tablet (25 mg total) by mouth 2 (two) times daily.  Dispense: 180 tablet; Refill: 3  Hypokalemia Assessment &  Plan: - Lab work showed slightly low potassium. Has been taking an OTC potassium supplement. - Discussed the role of magnesium in potassium regulation. - Plan:     - Advised to start an over-the-counter magnesium supplement at night. Counselled on benefits for muscle cramps and sleep.     - Will send a prescription for potassium supplement.     - Will re-evaluate need for potassium dose increase after starting magnesium.   Tobacco use disorder- approximately 28-pack-year history- NOW quit even vaping! Assessment & Plan: - Due for  annual low-dose CT scan for lung cancer screening. Last scan was on 09/16/2022. - Inquired about pneumonia vaccination. - Plan:     - Order for annual low-dose CT scan will be placed.     - Pneumococcal vaccine to be administered in the office today.   Elevated LDL cholesterol level Assessment & Plan: - Recent labs showed elevated cholesterol, which may be related to current low-carb, high-fat diet. - Given weight loss on current diet and lower overall cardiovascular risk, no medication is indicated at this time. - Plan:     - Continue current diet.     - Will recheck cholesterol in 6 months.   Other orders -     Potassium Chloride  Crys ER; Take 1 tablet (20 mEq total) by mouth daily.  Dispense: 90 tablet; Refill: 3     Return in about 6 months (around 03/14/2024) for hld .    Toribio MARLA Slain, MD

## 2023-09-12 NOTE — Assessment & Plan Note (Signed)
-   Recent labs showed elevated cholesterol, which may be related to current low-carb, high-fat diet. - Given weight loss on current diet and lower overall cardiovascular risk, no medication is indicated at this time. - Plan:     - Continue current diet.     - Will recheck cholesterol in 6 months.

## 2023-09-12 NOTE — Assessment & Plan Note (Signed)
-   Due for annual low-dose CT scan for lung cancer screening. Last scan was on 09/16/2022. - Inquired about pneumonia vaccination. - Plan:     - Order for annual low-dose CT scan will be placed.     - Pneumococcal vaccine to be administered in the office today.

## 2023-09-12 NOTE — Assessment & Plan Note (Signed)
-   Taking topiramate  25mg  at night, which has reduced the frequency of migraines but they last longer when they occur. - Uses sumatriptan  as needed, approximately twice a month, sometimes for multiple consecutive days during severe episodes. - Discussed options to improve migraine control. - Plan:     - Increase topiramate  to 25mg  twice daily (one tablet in the morning, one at night). Counselled that if daytime side effects are not tolerated, can take two tablets (50mg ) at bedtime. Prescription will be sent for twice daily dosing.     - Refill sumatriptan .

## 2023-09-12 NOTE — Patient Instructions (Signed)
 It was nice to see you today,  We addressed the following topics today: - I have resent all your labs as 90 day supplies with 3 refills - I have increased your topamax  to twice a day.  You can take it 2 times at night if needed.    Have a great day,  Rolan Slain, MD

## 2023-09-13 ENCOUNTER — Encounter: Payer: Self-pay | Admitting: Family Medicine

## 2023-09-20 ENCOUNTER — Ambulatory Visit
Admission: RE | Admit: 2023-09-20 | Discharge: 2023-09-20 | Disposition: A | Discharge status: Home / Self Care | Source: Ambulatory Visit | Attending: Family Medicine | Admitting: Family Medicine

## 2023-09-20 DIAGNOSIS — F17200 Nicotine dependence, unspecified, uncomplicated: Secondary | ICD-10-CM

## 2023-09-20 DIAGNOSIS — Z87891 Personal history of nicotine dependence: Secondary | ICD-10-CM | POA: Diagnosis not present

## 2023-10-03 ENCOUNTER — Ambulatory Visit: Payer: Self-pay | Admitting: Family Medicine

## 2024-03-05 ENCOUNTER — Other Ambulatory Visit: Payer: Self-pay | Admitting: Family Medicine

## 2024-03-05 DIAGNOSIS — E7800 Pure hypercholesterolemia, unspecified: Secondary | ICD-10-CM

## 2024-03-07 ENCOUNTER — Other Ambulatory Visit: Payer: Self-pay | Admitting: Medical Genetics

## 2024-03-07 ENCOUNTER — Other Ambulatory Visit

## 2024-03-07 DIAGNOSIS — E7800 Pure hypercholesterolemia, unspecified: Secondary | ICD-10-CM

## 2024-03-08 ENCOUNTER — Ambulatory Visit: Payer: Self-pay | Admitting: Family Medicine

## 2024-03-08 LAB — COMPREHENSIVE METABOLIC PANEL WITH GFR
ALT: 20 [IU]/L (ref 0–44)
AST: 14 [IU]/L (ref 0–40)
Albumin: 4.3 g/dL (ref 3.8–4.9)
Alkaline Phosphatase: 72 [IU]/L (ref 47–123)
BUN/Creatinine Ratio: 19 (ref 9–20)
BUN: 19 mg/dL (ref 6–24)
Bilirubin Total: 0.3 mg/dL (ref 0.0–1.2)
CO2: 22 mmol/L (ref 20–29)
Calcium: 9.4 mg/dL (ref 8.7–10.2)
Chloride: 106 mmol/L (ref 96–106)
Creatinine, Ser: 1 mg/dL (ref 0.76–1.27)
Globulin, Total: 2 g/dL (ref 1.5–4.5)
Glucose: 95 mg/dL (ref 70–99)
Potassium: 3.5 mmol/L (ref 3.5–5.2)
Sodium: 142 mmol/L (ref 134–144)
Total Protein: 6.3 g/dL (ref 6.0–8.5)
eGFR: 90 mL/min/{1.73_m2}

## 2024-03-08 LAB — LIPID PANEL
Chol/HDL Ratio: 5 ratio (ref 0.0–5.0)
Cholesterol, Total: 170 mg/dL (ref 100–199)
HDL: 34 mg/dL — ABNORMAL LOW
LDL Chol Calc (NIH): 102 mg/dL — ABNORMAL HIGH (ref 0–99)
Triglycerides: 195 mg/dL — ABNORMAL HIGH (ref 0–149)
VLDL Cholesterol Cal: 34 mg/dL (ref 5–40)

## 2024-03-10 ENCOUNTER — Other Ambulatory Visit: Payer: Self-pay

## 2024-03-14 ENCOUNTER — Encounter: Payer: Self-pay | Admitting: Family Medicine

## 2024-03-14 ENCOUNTER — Ambulatory Visit: Admitting: Family Medicine

## 2024-03-14 VITALS — BP 110/71 | HR 76 | Ht 71.0 in | Wt 215.1 lb

## 2024-03-14 DIAGNOSIS — E876 Hypokalemia: Secondary | ICD-10-CM

## 2024-03-14 DIAGNOSIS — I10 Essential (primary) hypertension: Secondary | ICD-10-CM

## 2024-03-14 DIAGNOSIS — G43009 Migraine without aura, not intractable, without status migrainosus: Secondary | ICD-10-CM

## 2024-03-14 DIAGNOSIS — E7800 Pure hypercholesterolemia, unspecified: Secondary | ICD-10-CM

## 2024-03-14 DIAGNOSIS — R911 Solitary pulmonary nodule: Secondary | ICD-10-CM | POA: Insufficient documentation

## 2024-03-14 MED ORDER — POTASSIUM CHLORIDE CRYS ER 10 MEQ PO TBCR: 30.0000 meq | EXTENDED_RELEASE_TABLET | Freq: Every day | ORAL | 3 refills | Status: AC

## 2024-03-14 MED ORDER — VALSARTAN-HYDROCHLOROTHIAZIDE 160-25 MG PO TABS: 1.0000 | ORAL_TABLET | Freq: Every day | ORAL | 3 refills | Status: AC

## 2024-03-14 NOTE — Assessment & Plan Note (Signed)
 Continue Topamax  twice daily. Improved since increasing from daily.

## 2024-03-14 NOTE — Assessment & Plan Note (Signed)
 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 Potassium level acceptable at 3.5. Current supplementation not tolerated. Possible magnesium deficiency. - Switched to three 10 mEq potassium chloride  tablets for better tolerance - Instructed to stop OTC potassium once on new prescription. - Ordered BMP and magnesium level in two weeks. - Consider magnesium supplementation if low.

## 2024-03-14 NOTE — Assessment & Plan Note (Signed)
 LDL decreased, triglycerides slightly elevated but not concerning. Low cardiovascular risk. No family history of early heart disease. - Continue lifestyle modifications with balanced diet and exercise. - Ordered additional cholesterol labs at physical (lpa and apo b)

## 2024-03-14 NOTE — Assessment & Plan Note (Signed)
 Well-controlled with valsartan  and hydrochlorothiazide . Blood pressure satisfactory. Discussed combination pill option, noted past supply issues. - Attempted switch to combination pill if supply issues resolved.

## 2024-03-14 NOTE — Assessment & Plan Note (Signed)
 Annual follow-up scheduled. No cardiovascular disease on imaging. - Schedule follow-up CT scan after August 25th.

## 2024-03-14 NOTE — Progress Notes (Unsigned)
 "   Subjective   Patient ID: Paul Schroeder, male    DOB: 1970-10-11  Age: 54 y.o. MRN: [id number]  Chief Complaint  Patient presents with   Medical Management of Chronic Issues     History of Present Illness   Paul Schroeder is a 54 year old male who presents for follow-up on potassium levels and medication management.  He is concerned about persistently low potassium despite a prescribed supplement and an over-the-counter 90 mg potassium pill, and increased dietary intake with bananas. The prescribed potassium pill is large and chalky and hard for him to swallow.  His hypertension regimen is valsartan  160 mg once daily and hydrochlorothiazide  25 mg once daily after supply issues with the prior combination pill. He is open to returning to the combo pill if available. He also takes verapamil  twice daily and Topamax  twice daily with good headache control, and he continues finasteride .  He recently had abnormal cholesterol but reports that LDL has decreased. He has restarted a point-based balanced diet, walks regularly at work, does not smoke, and has no family history of early heart disease.  He has a lung nodule that needs annual CT follow-up, with the next scan expected after August, and plans to schedule it around his annual physical.  He feels well and relaxed with no new symptoms.          The 10-year ASCVD risk score (Arnett DK, et al., 2019) is: 4.9%  Health Maintenance Due  Topic Date Due   Hepatitis C Screening  Never done   Hepatitis B Vaccines 19-59 Average Risk (1 of 3 - 19+ 3-dose series) Never done   COVID-19 Vaccine (6 - 2025-26 season) 10/10/2023      Objective:     BP 110/71   Pulse 76   Ht 5' 11 (1.803 m)   Wt 215 lb 1.9 oz (97.6 kg)   SpO2 98%   BMI 30.00 kg/m  {Vitals History (Optional):23777}  Physical Exam     Gen: alert, oriented Pulm: no respiratory distress Psych: pleasant affect       No results found for any visits  on 03/14/24.      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 Potassium level acceptable at 3.5. Current supplementation not tolerated. Possible magnesium deficiency. - Switched to three 10 mEq potassium chloride  tablets for better tolerance - Instructed to stop OTC potassium once on new prescription. - Ordered BMP and magnesium level in two weeks. - Consider magnesium supplementation if low.  Orders: -     Basic metabolic panel with GFR; Future -     Magnesium; Future  Migraine without aura and without status migrainosus, not intractable Assessment & Plan: Continue Topamax  twice daily. Improved since increasing from daily.    Elevated LDL cholesterol level Assessment & Plan: LDL decreased, triglycerides slightly elevated but not concerning. Low cardiovascular risk. No family history of early heart disease. - Continue lifestyle modifications with balanced diet and exercise. - Ordered additional cholesterol labs at physical (lpa and apo b)   Pulmonary nodule Assessment & Plan: Annual follow-up scheduled. No cardiovascular disease on imaging. - Schedule follow-up CT scan after August 25th.    Primary hypertension Assessment & Plan: Well-controlled with valsartan  and hydrochlorothiazide . Blood pressure satisfactory. Discussed combination pill option, noted past supply issues. - Attempted switch to combination pill if supply issues resolved.   Other orders -     Valsartan -hydroCHLOROthiazide ; Take 1 tablet  by mouth daily.  Dispense: 90 tablet; Refill: 3 -     Potassium Chloride  Crys ER; Take 3 tablets (30 mEq total) by mouth daily.  Dispense: 270 tablet; Refill: 3         Return in about 6 months (around 09/11/2024) for physical.    Toribio MARLA Slain, MD  "

## 2024-03-14 NOTE — Patient Instructions (Signed)
" °  VISIT SUMMARY: Today, you had a follow-up appointment to discuss your potassium levels and medication management. We addressed your concerns about low potassium, reviewed your hypertension and migraine treatments, and discussed your cholesterol levels and lung nodule follow-up.  YOUR PLAN: HYPOKALEMIA: You have low potassium levels, likely due to your hydrochlorothiazide  medication. -We have switched you to three 10 mEq potassium chloride  tablets. -Stop taking the over-the-counter potassium once you start the new prescription. -We will check your basic metabolic panel (BMP) and magnesium levels in two weeks. -If your magnesium is low, we may consider magnesium supplementation.  HYPERTENSION: Your blood pressure is well-controlled with your current medications. -We discussed the option of switching back to the combination pill if the supply issues are resolved.  MIGRAINE: Your migraines are well-controlled with your current medication. -Continue taking Topamax  twice daily.  HYPERLIPIDEMIA: Your LDL cholesterol has decreased, and your triglycerides are slightly elevated but not concerning. -Continue with your balanced diet and regular exercise. -We have ordered additional cholesterol labs.  PULMONARY NODULE: You have a lung nodule that requires annual follow-up. -Schedule your follow-up CT scan after August 25th.    "

## 2024-09-04 ENCOUNTER — Other Ambulatory Visit

## 2024-09-11 ENCOUNTER — Ambulatory Visit: Admitting: Family Medicine
